# Patient Record
Sex: Male | Born: 1965
Health system: Southern US, Community
[De-identification: ages and names within clinical notes are randomized; demographics above are authoritative.]

## PROBLEM LIST (undated history)

## (undated) ENCOUNTER — Emergency Department (HOSPITAL_BASED_OUTPATIENT_CLINIC_OR_DEPARTMENT_OTHER): Admission: EM | Payer: Medicaid Other | Source: Home / Self Care

## (undated) DIAGNOSIS — R7303 Prediabetes: Secondary | ICD-10-CM

## (undated) DIAGNOSIS — F419 Anxiety disorder, unspecified: Secondary | ICD-10-CM

## (undated) DIAGNOSIS — E785 Hyperlipidemia, unspecified: Secondary | ICD-10-CM

## (undated) DIAGNOSIS — F329 Major depressive disorder, single episode, unspecified: Secondary | ICD-10-CM

## (undated) DIAGNOSIS — J45909 Unspecified asthma, uncomplicated: Secondary | ICD-10-CM

## (undated) DIAGNOSIS — I10 Essential (primary) hypertension: Secondary | ICD-10-CM

## (undated) DIAGNOSIS — R0600 Dyspnea, unspecified: Secondary | ICD-10-CM

## (undated) DIAGNOSIS — M109 Gout, unspecified: Secondary | ICD-10-CM

## (undated) DIAGNOSIS — Z8711 Personal history of peptic ulcer disease: Secondary | ICD-10-CM

## (undated) DIAGNOSIS — K219 Gastro-esophageal reflux disease without esophagitis: Secondary | ICD-10-CM

## (undated) DIAGNOSIS — G8929 Other chronic pain: Secondary | ICD-10-CM

## (undated) DIAGNOSIS — F32A Depression, unspecified: Secondary | ICD-10-CM

## (undated) DIAGNOSIS — Z8719 Personal history of other diseases of the digestive system: Secondary | ICD-10-CM

## (undated) DIAGNOSIS — E119 Type 2 diabetes mellitus without complications: Secondary | ICD-10-CM

## (undated) DIAGNOSIS — M1990 Unspecified osteoarthritis, unspecified site: Secondary | ICD-10-CM

## (undated) DIAGNOSIS — E7800 Pure hypercholesterolemia, unspecified: Secondary | ICD-10-CM

## (undated) HISTORY — PX: APPENDECTOMY: SHX54

## (undated) HISTORY — PX: BACK SURGERY: SHX140

## (undated) HISTORY — PX: ABDOMINAL SURGERY: SHX537

## (undated) HISTORY — PX: OTHER SURGICAL HISTORY: SHX169

## (undated) HISTORY — PX: EYE SURGERY: SHX253

## (undated) HISTORY — PX: UMBILICAL HERNIA REPAIR: SHX196

---

## 1989-07-09 HISTORY — PX: ABDOMINAL SURGERY: SHX537

## 1999-02-10 ENCOUNTER — Emergency Department (HOSPITAL_COMMUNITY): Admission: EM | Admit: 1999-02-10 | Discharge: 1999-02-11 | Payer: Self-pay

## 1999-04-22 ENCOUNTER — Encounter: Payer: Self-pay | Admitting: Emergency Medicine

## 1999-04-22 ENCOUNTER — Emergency Department (HOSPITAL_COMMUNITY): Admission: EM | Admit: 1999-04-22 | Discharge: 1999-04-22 | Payer: Self-pay

## 1999-09-02 ENCOUNTER — Emergency Department (HOSPITAL_COMMUNITY): Admission: EM | Admit: 1999-09-02 | Discharge: 1999-09-02 | Payer: Self-pay | Admitting: Emergency Medicine

## 2003-06-09 ENCOUNTER — Emergency Department (HOSPITAL_COMMUNITY): Admission: EM | Admit: 2003-06-09 | Discharge: 2003-06-09 | Payer: Self-pay | Admitting: Emergency Medicine

## 2004-10-15 ENCOUNTER — Emergency Department (HOSPITAL_COMMUNITY): Admission: EM | Admit: 2004-10-15 | Discharge: 2004-10-15 | Payer: Self-pay | Admitting: Emergency Medicine

## 2007-11-30 ENCOUNTER — Emergency Department (HOSPITAL_COMMUNITY): Admission: EM | Admit: 2007-11-30 | Discharge: 2007-11-30 | Payer: Self-pay | Admitting: Emergency Medicine

## 2008-03-03 ENCOUNTER — Emergency Department (HOSPITAL_COMMUNITY): Admission: EM | Admit: 2008-03-03 | Discharge: 2008-03-03 | Payer: Self-pay | Admitting: Emergency Medicine

## 2008-03-03 IMAGING — CR DG CHEST 2V
2 series · 2 of 2 positions shown · non-contrast
Comparison: None

CLINICAL DATA: Cough, shortness of breath

CHEST - 2 VIEW

[w chest pa]
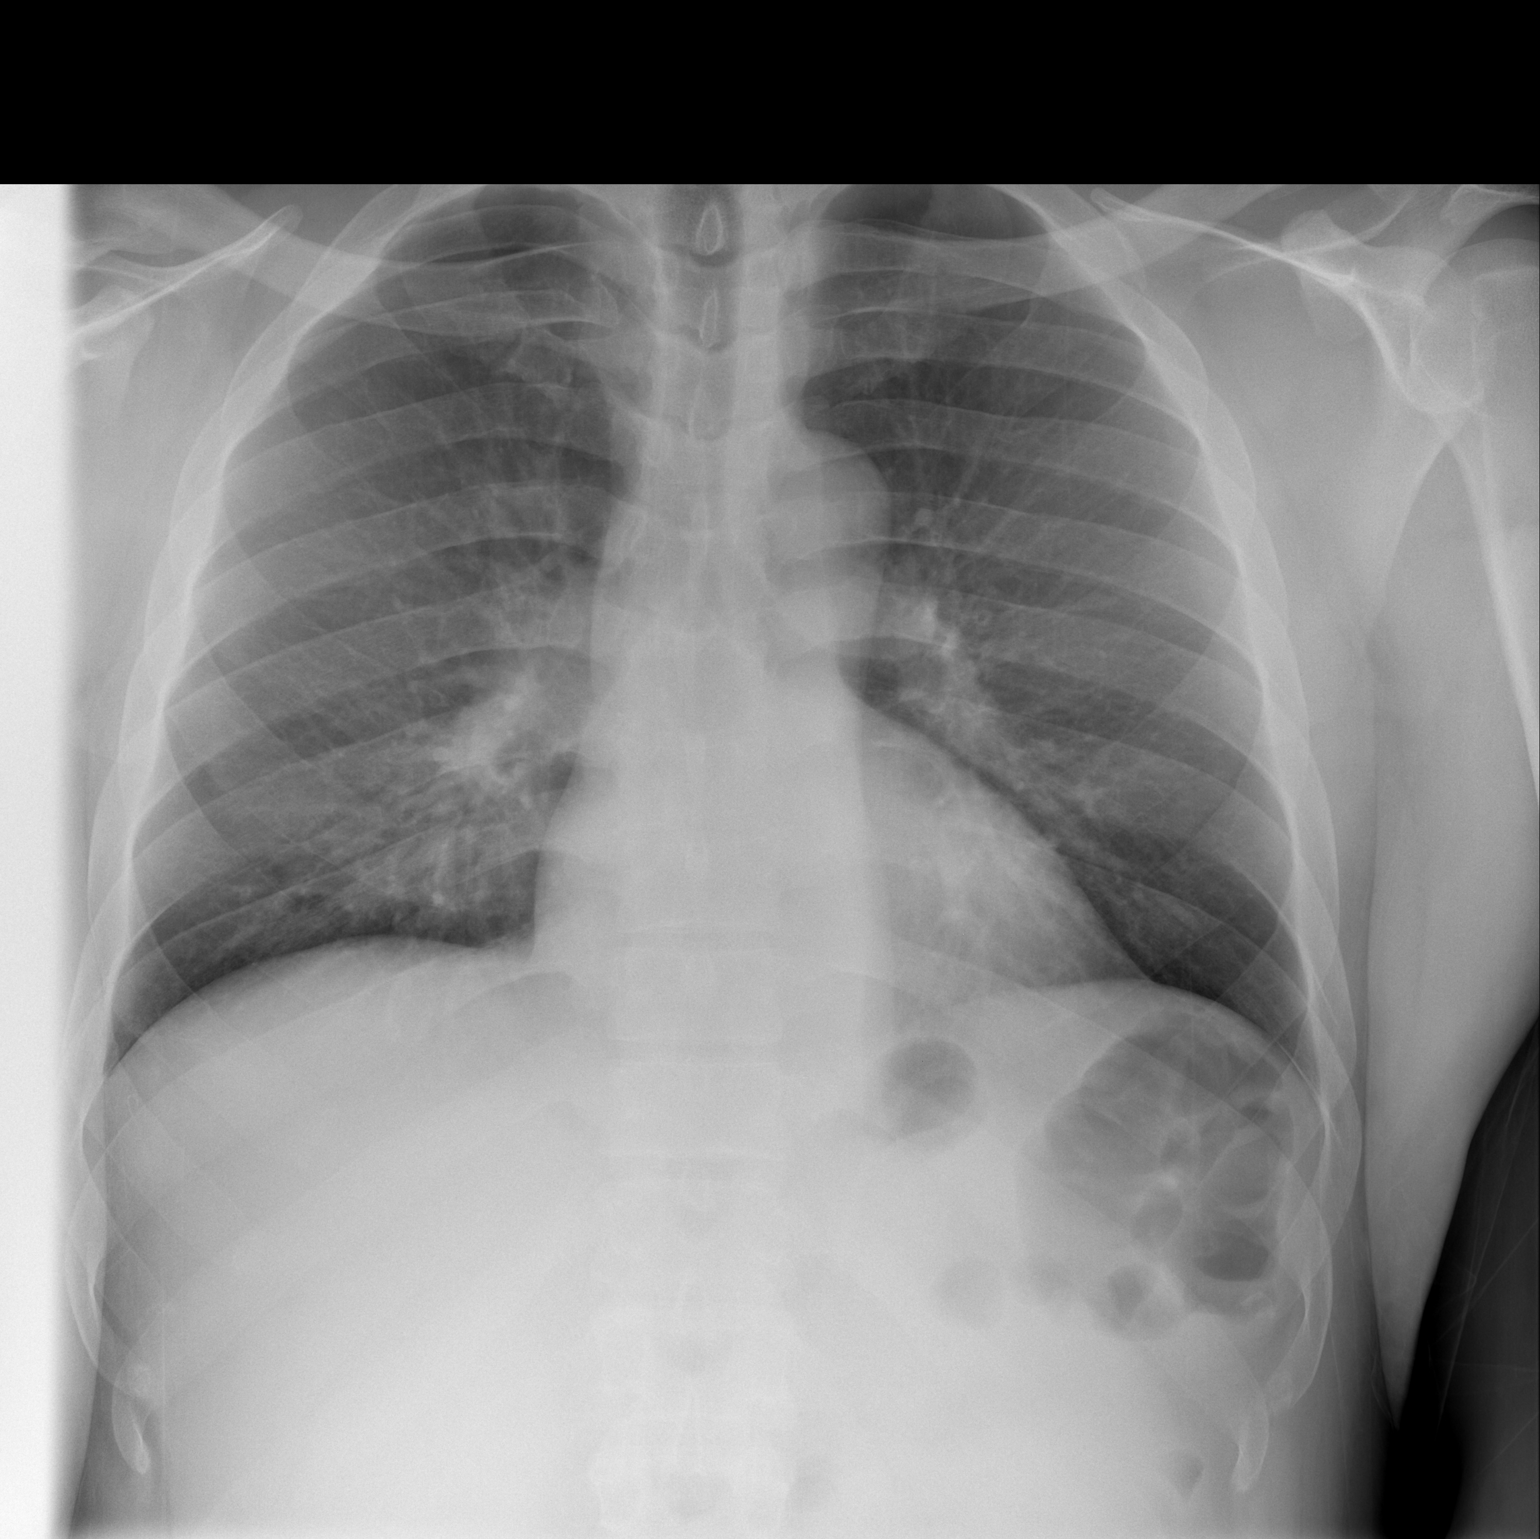

[w chest lat]
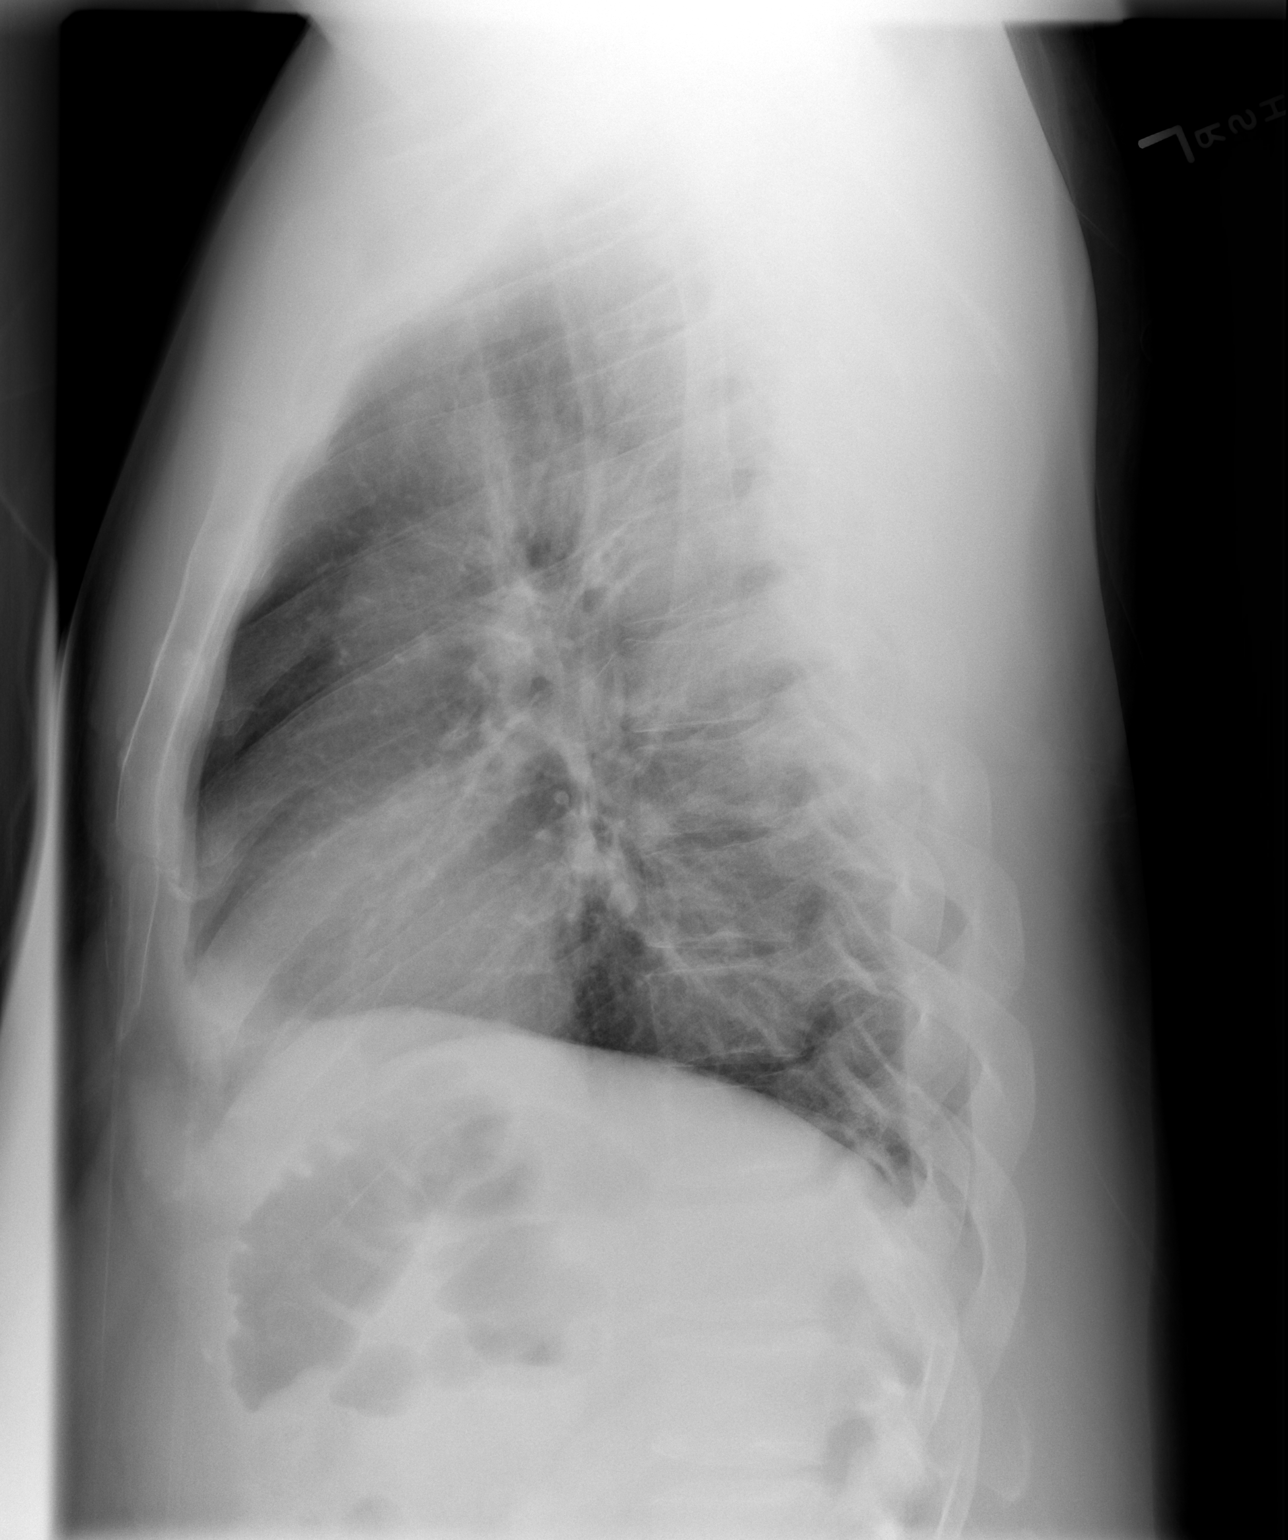

[2 of 2 positions shown; findings below may reference images not displayed]

FINDINGS: Heart and mediastinal contours are within normal limits.
No focal opacities or effusions.  No acute bony abnormality.
IMPRESSION: No active disease.

## 2008-03-28 ENCOUNTER — Emergency Department (HOSPITAL_COMMUNITY): Admission: EM | Admit: 2008-03-28 | Discharge: 2008-03-28 | Payer: Self-pay | Admitting: Emergency Medicine

## 2008-03-28 IMAGING — CR DG NECK SOFT TISSUE
2 series · 2 of 2 positions shown · non-contrast
Comparison: None

CLINICAL DATA: Foreign body stuck in throat

NECK SOFT TISSUES - 3+ VIEW

[w soft tissue neck]
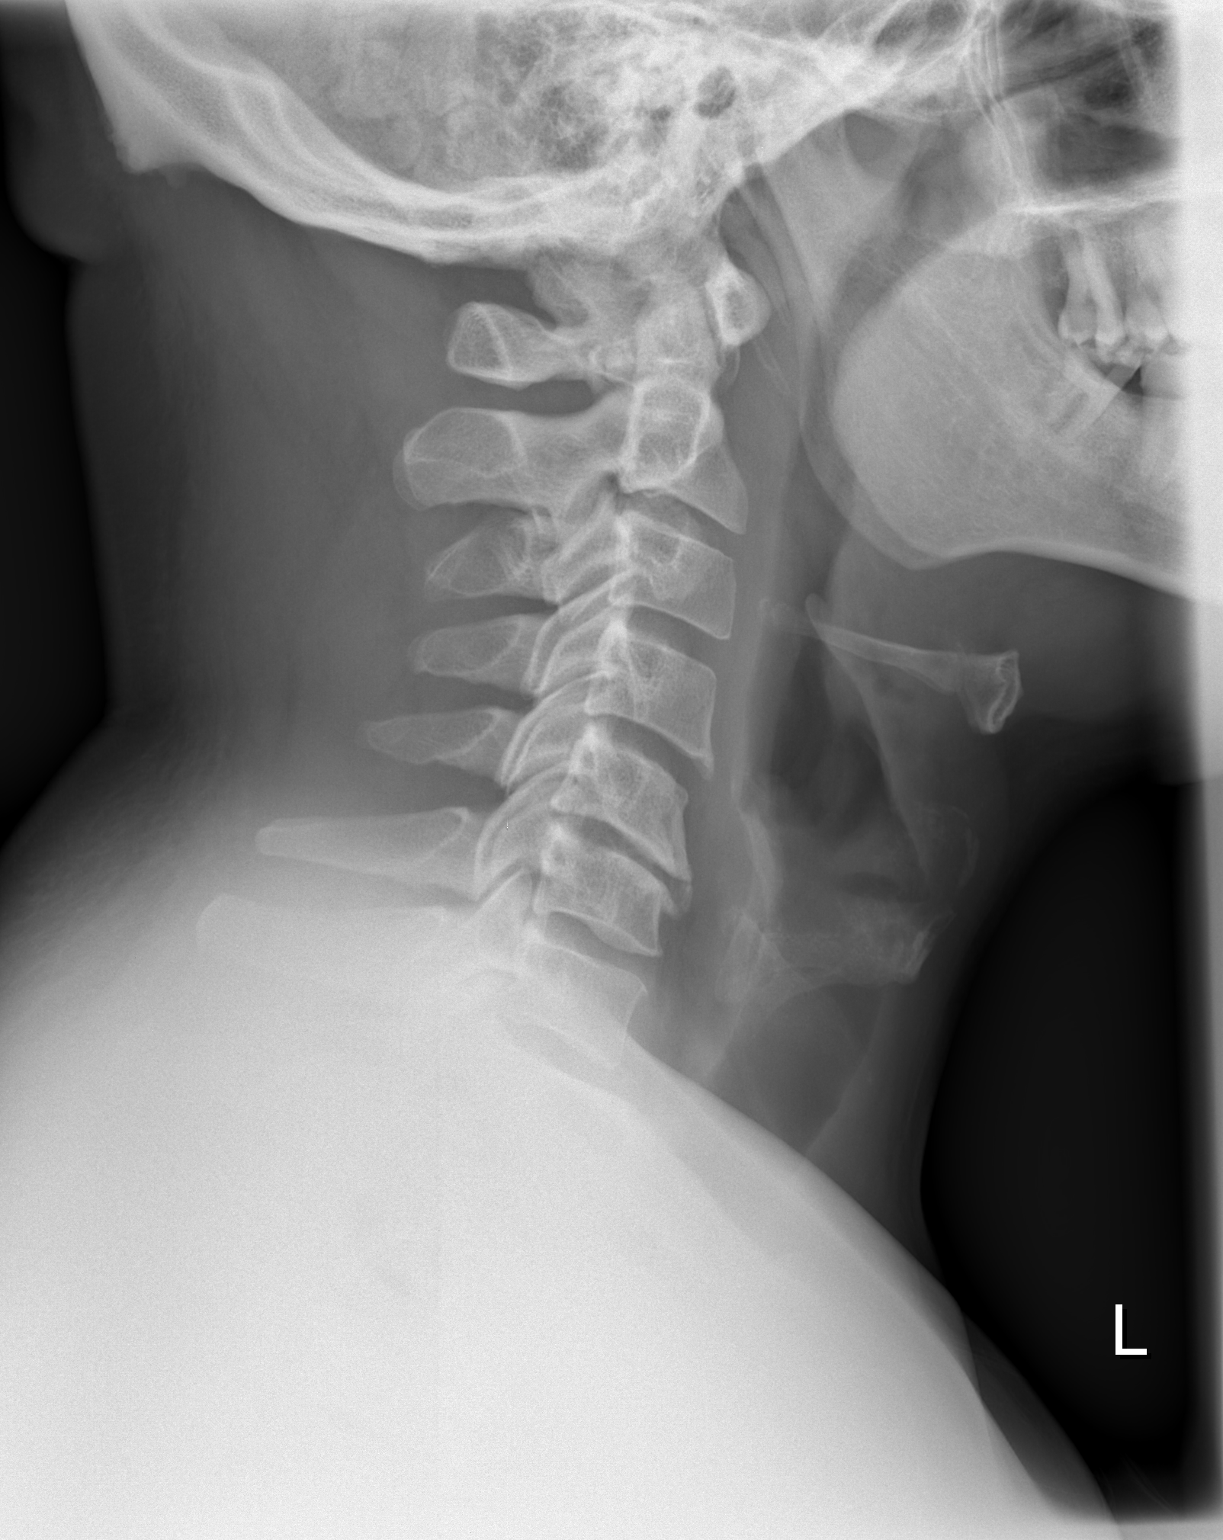

[w soft tissue neck ap]
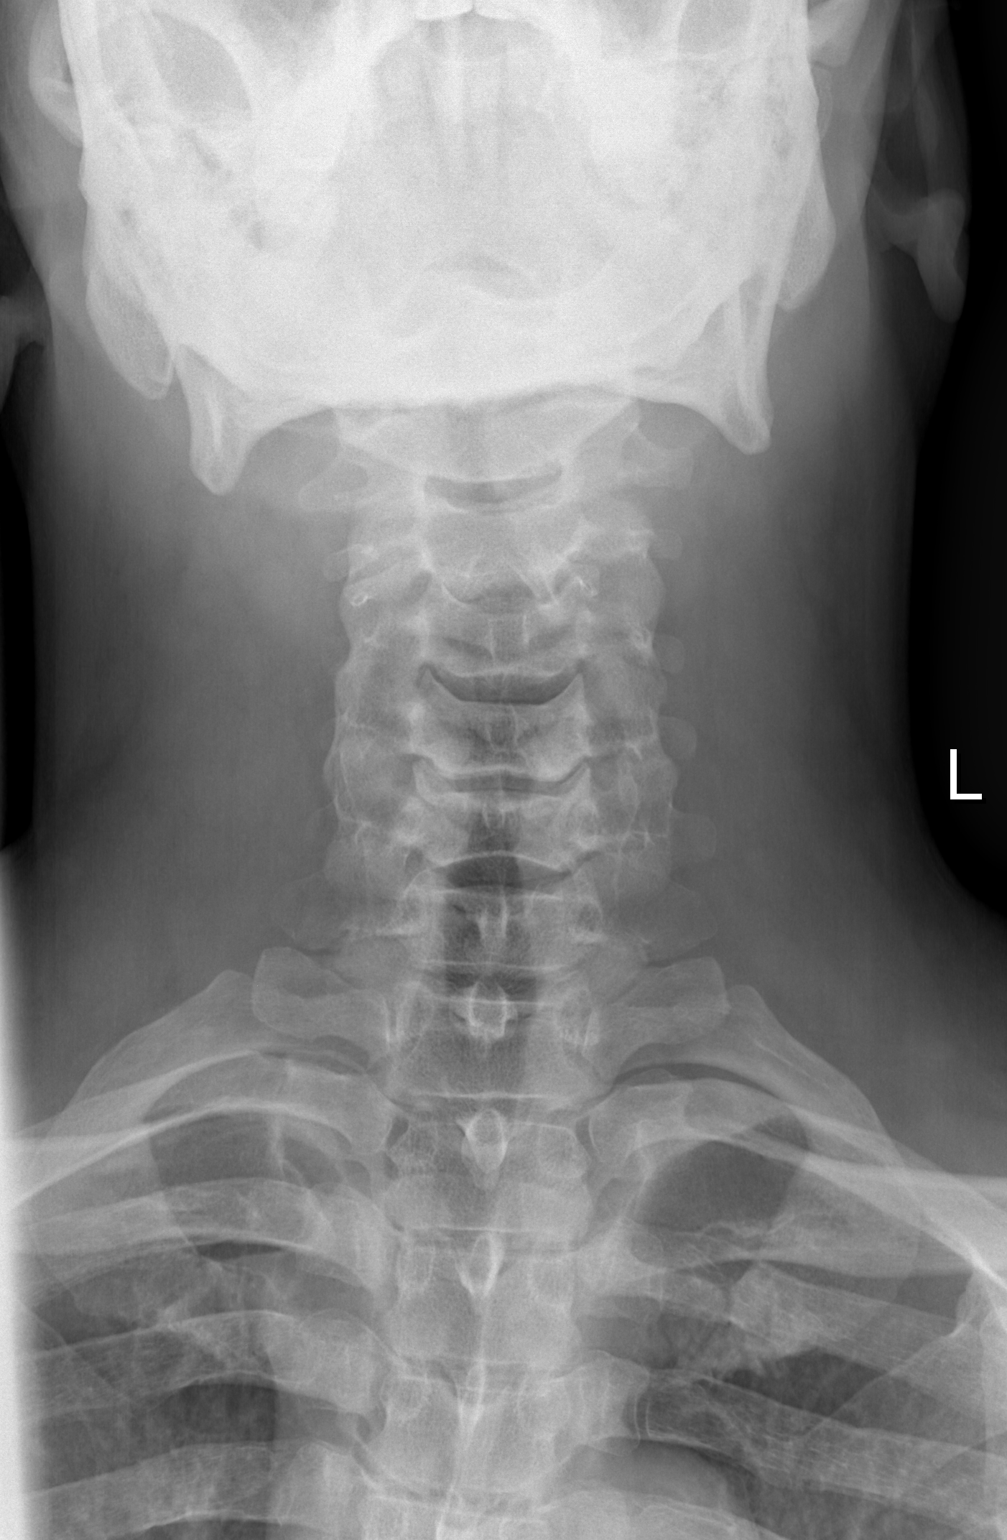

[2 of 2 positions shown; findings below may reference images not displayed]

FINDINGS: The airway appears unremarkable.  No radiopaque foreign
body.  Soft tissues unremarkable.  Degenerative disc disease
changes at C5-6.
IMPRESSION: Specifically, there is no radiopaque foreign body.

## 2008-11-10 ENCOUNTER — Emergency Department (HOSPITAL_COMMUNITY): Admission: EM | Admit: 2008-11-10 | Discharge: 2008-11-10 | Payer: Self-pay | Admitting: Emergency Medicine

## 2009-01-22 ENCOUNTER — Emergency Department (HOSPITAL_COMMUNITY): Admission: EM | Admit: 2009-01-22 | Discharge: 2009-01-22 | Payer: Self-pay | Admitting: Emergency Medicine

## 2009-01-22 IMAGING — CR DG CHEST 1V PORT
1 series · 1 of 1 positions shown · non-contrast
Comparison: [DATE]

CLINICAL DATA: Respiratory distress, chest pain

PORTABLE CHEST - 1 VIEW

[view not recorded]
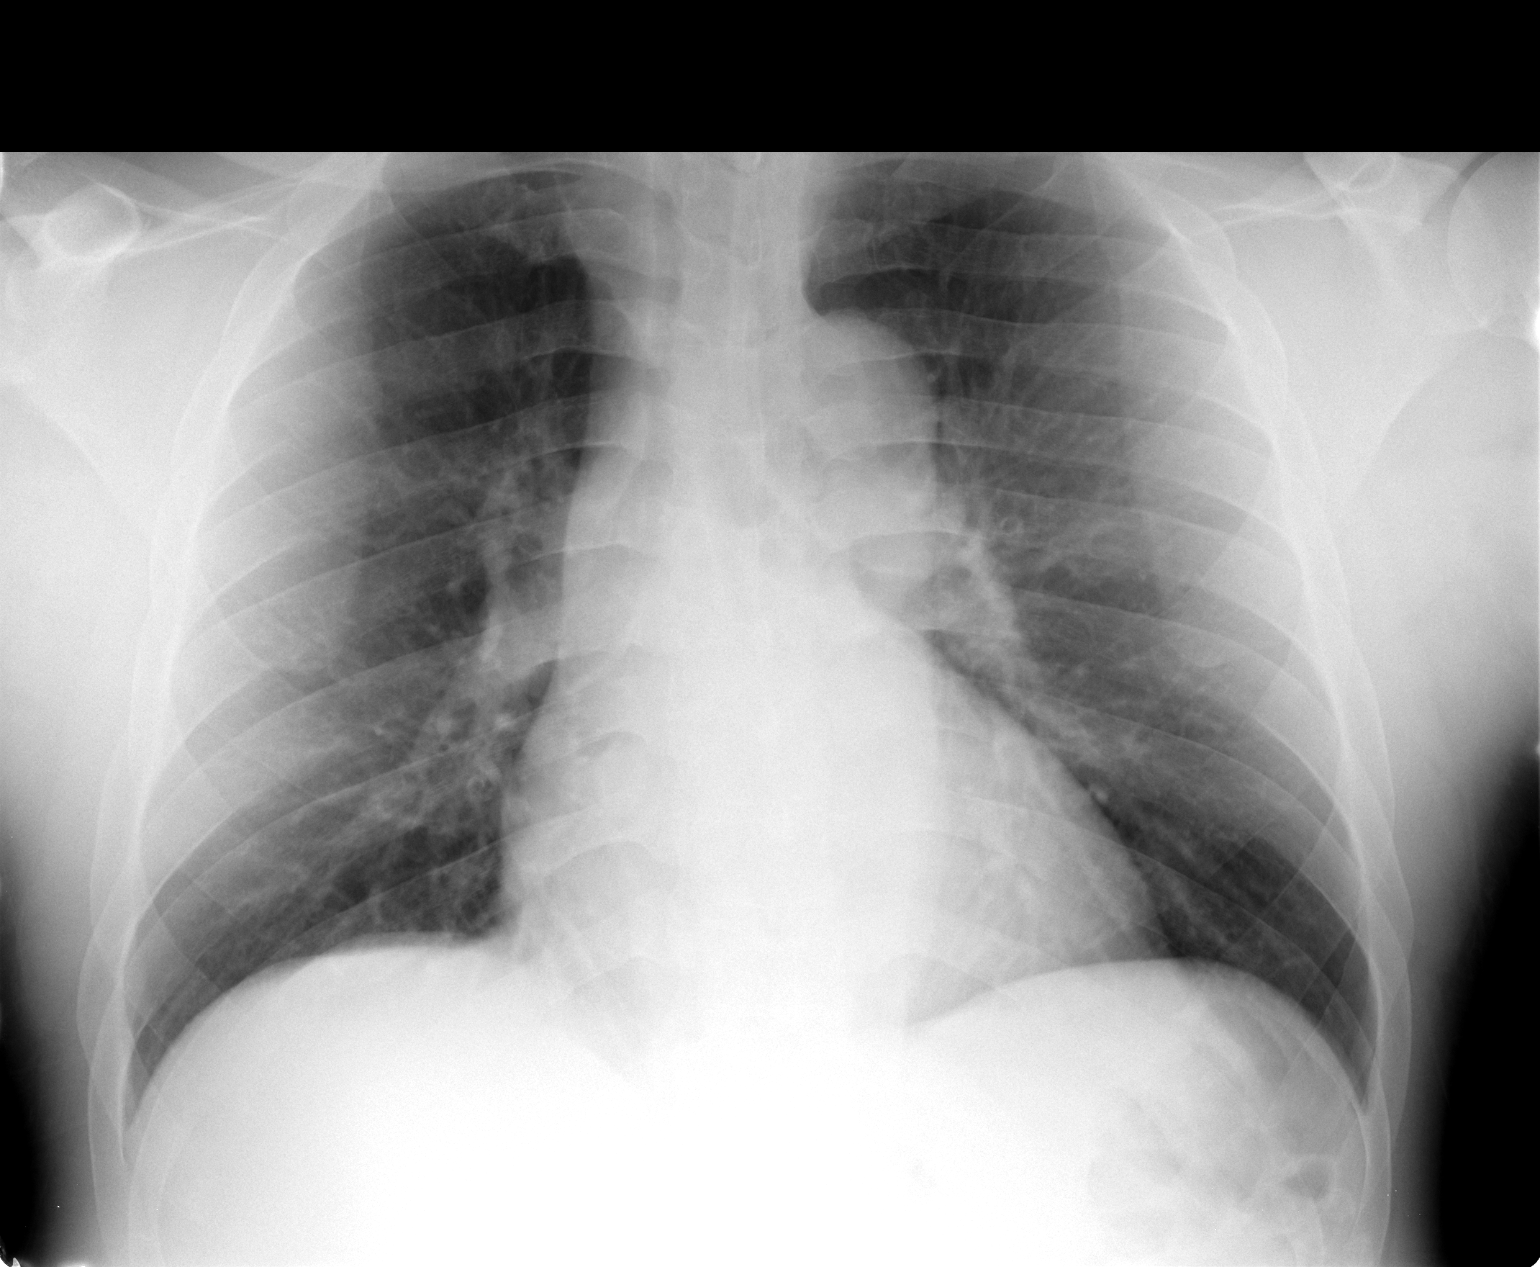

[1 of 1 positions shown; findings below may reference images not displayed]

FINDINGS: Heart size is at the upper limits of normal which may be
accentuated by AP portable technique.  Aorta is unfolded.  Lungs
are clear.  No pleural effusion.
IMPRESSION: No acute cardiopulmonary process.  Borderline cardiomegaly.  If the
patient's symptoms continue, consider PA and lateral chest
radiographs obtained at full inspiration when the patient is
clinically able.

## 2010-10-17 LAB — POCT I-STAT, CHEM 8
BUN: 14 mg/dL (ref 6–23)
Calcium, Ion: 1.1 mmol/L — ABNORMAL LOW (ref 1.12–1.32)
Chloride: 107 mEq/L (ref 96–112)
Creatinine, Ser: 1.1 mg/dL (ref 0.4–1.5)
Glucose, Bld: 104 mg/dL — ABNORMAL HIGH (ref 70–99)
HCT: 46 % (ref 39.0–52.0)
Hemoglobin: 15.6 g/dL (ref 13.0–17.0)
Potassium: 3.6 mEq/L (ref 3.5–5.1)
Sodium: 140 mEq/L (ref 135–145)
TCO2: 23 mmol/L (ref 0–100)

## 2010-10-17 LAB — CK: Total CK: 640 U/L — ABNORMAL HIGH (ref 7–232)

## 2011-02-24 ENCOUNTER — Encounter: Payer: Self-pay | Admitting: *Deleted

## 2011-02-24 ENCOUNTER — Emergency Department (HOSPITAL_BASED_OUTPATIENT_CLINIC_OR_DEPARTMENT_OTHER)
Admission: EM | Admit: 2011-02-24 | Discharge: 2011-02-24 | Disposition: A | Discharge status: Home / Self Care | Payer: Self-pay | Attending: Emergency Medicine | Admitting: Emergency Medicine

## 2011-02-24 ENCOUNTER — Emergency Department (INDEPENDENT_AMBULATORY_CARE_PROVIDER_SITE_OTHER): Payer: Self-pay

## 2011-02-24 DIAGNOSIS — R0602 Shortness of breath: Secondary | ICD-10-CM | POA: Insufficient documentation

## 2011-02-24 DIAGNOSIS — J45909 Unspecified asthma, uncomplicated: Secondary | ICD-10-CM | POA: Insufficient documentation

## 2011-02-24 DIAGNOSIS — R05 Cough: Secondary | ICD-10-CM

## 2011-02-24 DIAGNOSIS — R059 Cough, unspecified: Secondary | ICD-10-CM

## 2011-02-24 IMAGING — CR DG CHEST 2V
2 series · 2 of 2 positions shown · non-contrast
Comparison: [DATE]

CLINICAL DATA: Shortness of breath, cough

CHEST - 2 VIEW

[w chest pa]
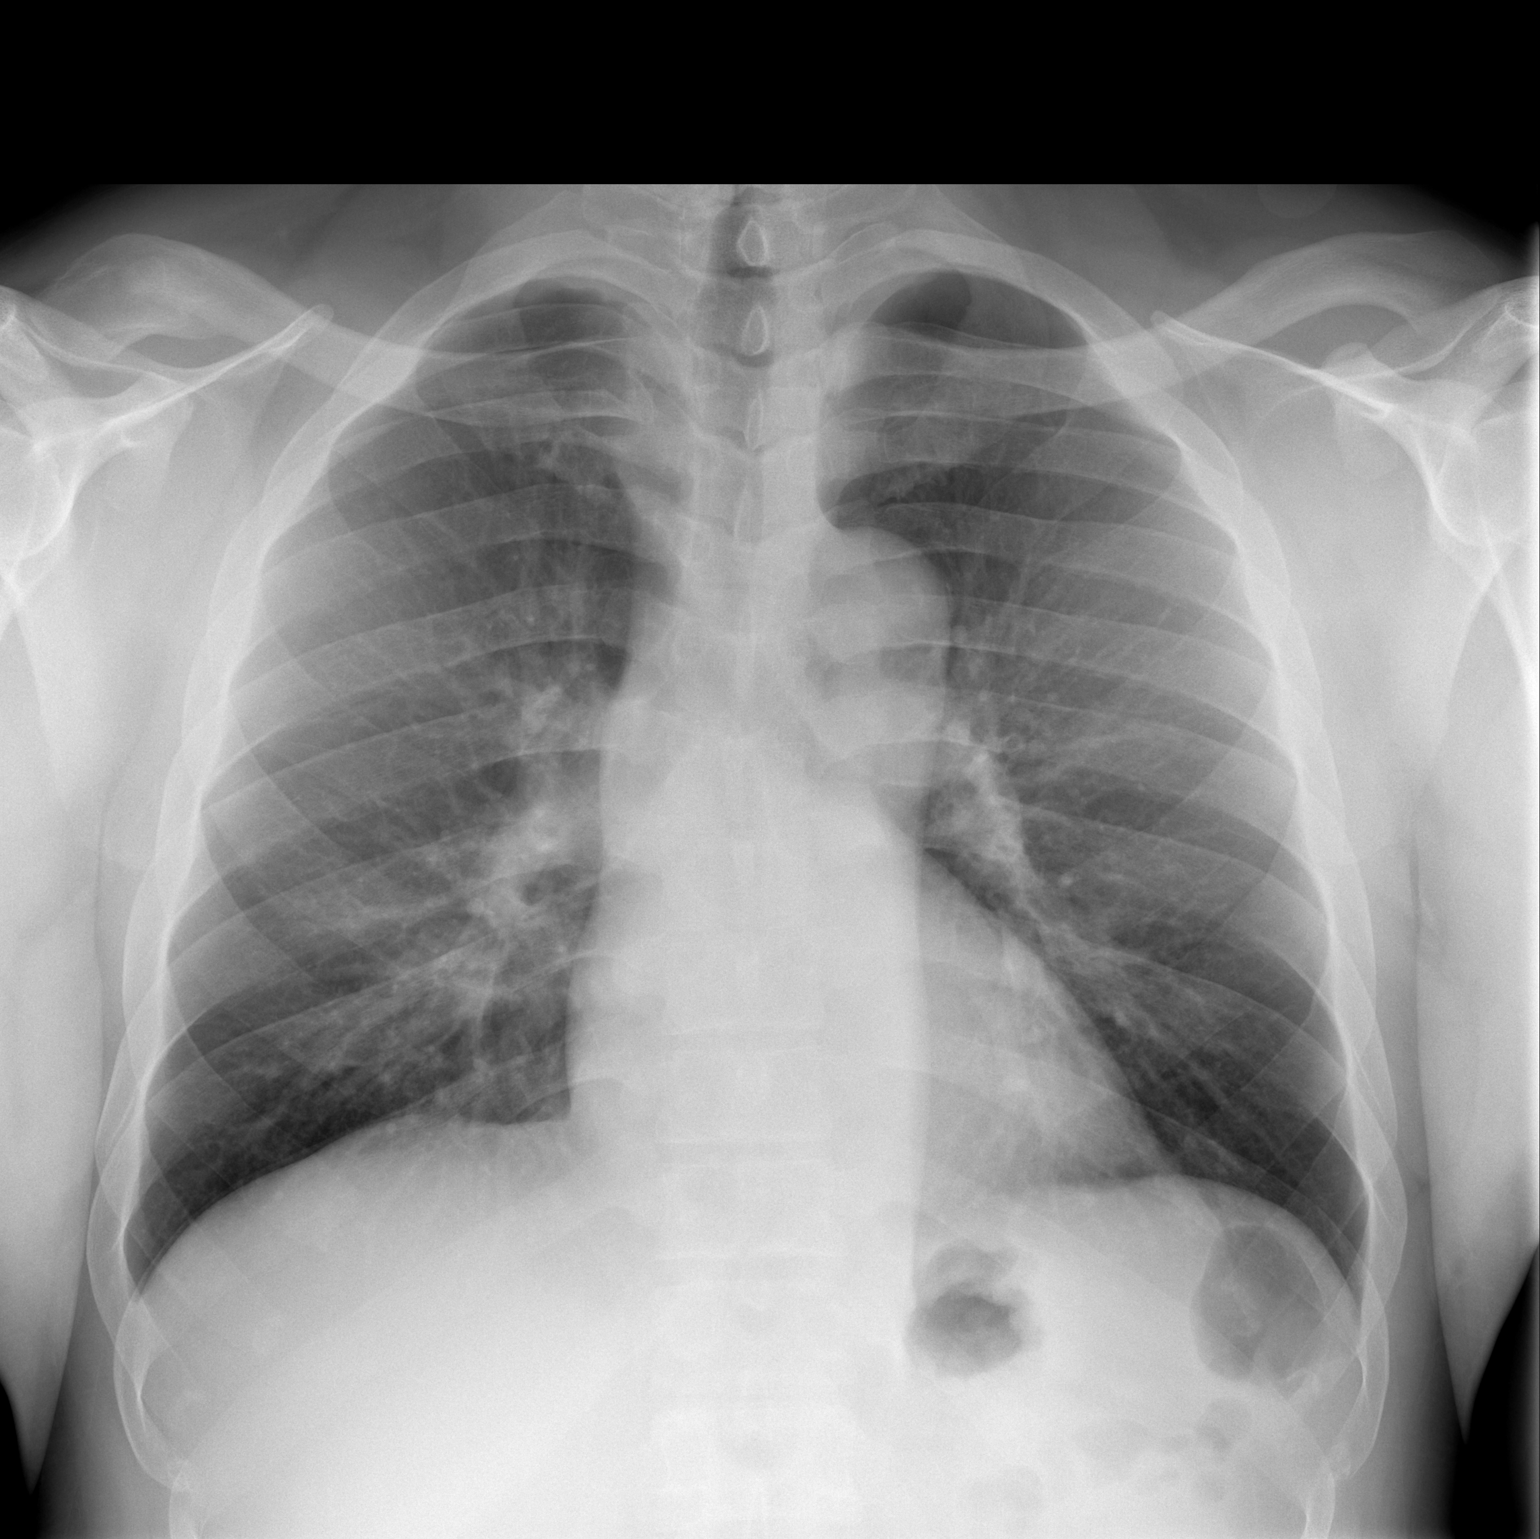

[w chest lat]
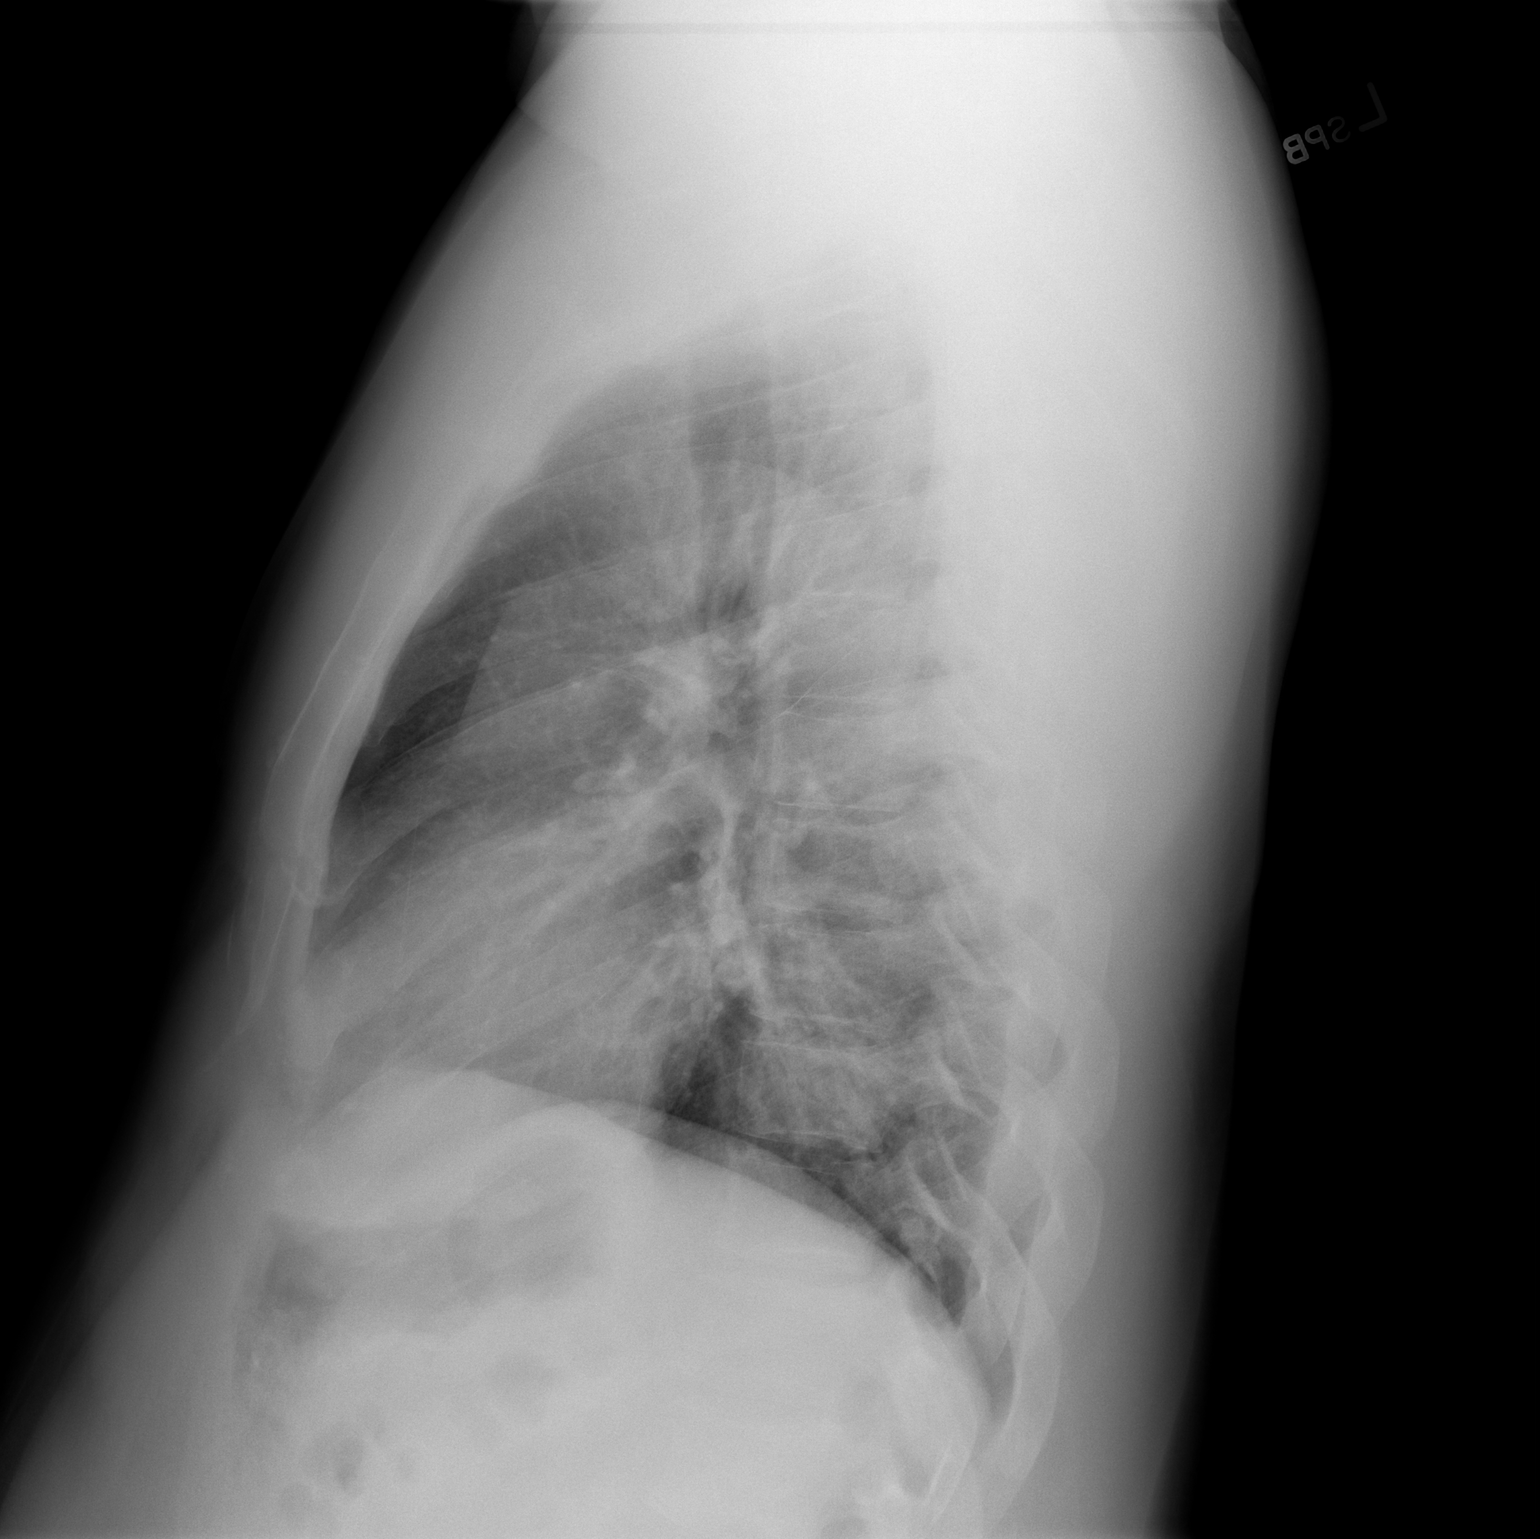

[2 of 2 positions shown; findings below may reference images not displayed]

FINDINGS: Aorta is ectatic and unfolded. The heart size is normal.
The lungs are clear.  No pleural effusion.  No acute osseous
finding.
IMPRESSION: No acute cardiopulmonary process.

## 2011-02-24 MED ORDER — ALBUTEROL SULFATE (5 MG/ML) 0.5% IN NEBU: 5.0000 mg | INHALATION_SOLUTION | RESPIRATORY_TRACT | Status: DC

## 2011-02-24 MED ORDER — PREDNISONE 20 MG PO TABS: 60.0000 mg | ORAL_TABLET | Freq: Every day | ORAL | Status: DC

## 2011-02-24 MED ORDER — ALBUTEROL SULFATE (5 MG/ML) 0.5% IN NEBU
2.5000 mg | INHALATION_SOLUTION | Freq: Once | RESPIRATORY_TRACT | Status: DC
  Administered 2011-02-24: 5 mg via RESPIRATORY_TRACT

## 2011-02-24 MED ORDER — IPRATROPIUM BROMIDE 0.02 % IN SOLN
RESPIRATORY_TRACT | Status: AC
  Administered 2011-02-24: 0.5 mg via RESPIRATORY_TRACT
  Filled 2011-02-24: qty 2.5

## 2011-02-24 MED ORDER — IPRATROPIUM BROMIDE 0.02 % IN SOLN
0.5000 mg | Freq: Once | RESPIRATORY_TRACT | Status: DC
  Administered 2011-02-24: 0.5 mg via RESPIRATORY_TRACT

## 2011-02-24 MED ORDER — IPRATROPIUM BROMIDE 0.02 % IN SOLN: 0.5000 mg | Freq: Once | RESPIRATORY_TRACT | Status: DC

## 2011-02-24 MED ORDER — PREDNISONE 20 MG PO TABS
60.0000 mg | ORAL_TABLET | Freq: Once | ORAL | Status: AC
  Administered 2011-02-24: 60 mg via ORAL

## 2011-02-24 MED ORDER — ALBUTEROL SULFATE (5 MG/ML) 0.5% IN NEBU
INHALATION_SOLUTION | RESPIRATORY_TRACT | Status: AC
  Administered 2011-02-24: 5 mg via RESPIRATORY_TRACT
  Filled 2011-02-24: qty 1

## 2011-02-24 MED ORDER — ALBUTEROL SULFATE HFA 108 (90 BASE) MCG/ACT IN AERS
2.0000 | INHALATION_SPRAY | Freq: Once | RESPIRATORY_TRACT | Status: AC
  Administered 2011-02-24: 2 via RESPIRATORY_TRACT
  Filled 2011-02-24: qty 6.7

## 2011-02-24 MED ORDER — PREDNISONE 10 MG PO TABS: 20.0000 mg | ORAL_TABLET | Freq: Every day | ORAL | Status: DC

## 2011-02-24 MED ORDER — PREDNISONE 20 MG PO TABS
ORAL_TABLET | ORAL | Status: AC
  Filled 2011-02-24: qty 3

## 2011-02-24 NOTE — ED Provider Notes (Addendum)
History     CSN: [id number] Arrival date & time: 02/24/2011  8:44 AM  Chief Complaint  Patient presents with  . Asthma  . Shortness of Breath  . Wheezing   Patient is a 45 y.o. male presenting with asthma, shortness of breath, and wheezing. The history is provided by the patient.  Asthma This is a recurrent problem. The current episode started more than 2 days ago. The problem occurs constantly. The problem has been gradually worsening. Associated symptoms include shortness of breath. The symptoms are aggravated by nothing. The symptoms are relieved by nothing. The treatment provided no relief.  Shortness of Breath  Associated symptoms include shortness of breath and wheezing. His past medical history is significant for asthma.  Wheezing  Associated symptoms include shortness of breath and wheezing. His past medical history is significant for asthma.    Past Medical History  Diagnosis Date  . Asthma     History reviewed. No pertinent past surgical history.  History reviewed. No pertinent family history.  History  Substance Use Topics  . Smoking status: Never Smoker   . Smokeless tobacco: Not on file  . Alcohol Use: 1.2 oz/week    2 Cans of beer per week      Review of Systems  Respiratory: Positive for shortness of breath and wheezing.   All other systems reviewed and are negative.    Physical Exam  BP 149/99  Pulse 90  Temp(Src) 98.5 F (36.9 C) (Oral)  Resp 24  SpO2 94%  Physical Exam  Constitutional: He is oriented to person, place, and time. He appears well-developed and well-nourished.  HENT:  Head: Normocephalic and atraumatic.  Eyes: Pupils are equal, round, and reactive to light.  Cardiovascular: Normal rate.   Pulmonary/Chest: He has wheezes.       stridor  Abdominal: Soft.  Musculoskeletal: Normal range of motion. He exhibits no edema and no tenderness.  Neurological: He is alert and oriented to person, place, and time.  Skin: Skin is warm and  dry.  Psychiatric: He has a normal mood and affect.    ED Course  Procedures  MDM Wheezing and stridor resolved with first neb.  Patient given and instructed on albuterol mdi.  Prednisone given here.      Hilario Quarry, MD 02/24/11 1006  Hilario Quarry, MD 02/25/11 [phone]

## 2011-02-24 NOTE — ED Notes (Signed)
After tx bbs exp wheezing however not audible.  Spo2 94% resting.  Pt states "I feels better."

## 2011-02-24 NOTE — ED Notes (Signed)
Pt declines w/c, amb from registration to room 1 with quick steady gait, audible wheezes noted.  Pt is able to speak short phrases, rt at bedside with neb treatment, dr. Rosalia Hammers at bedside for eval.  Rpp, air sats initially 91-92%.

## 2011-03-01 ENCOUNTER — Emergency Department (HOSPITAL_BASED_OUTPATIENT_CLINIC_OR_DEPARTMENT_OTHER)
Admission: EM | Admit: 2011-03-01 | Discharge: 2011-03-01 | Disposition: A | Discharge status: Home / Self Care | Payer: Self-pay | Attending: Emergency Medicine | Admitting: Emergency Medicine

## 2011-03-01 ENCOUNTER — Emergency Department (INDEPENDENT_AMBULATORY_CARE_PROVIDER_SITE_OTHER): Payer: Self-pay

## 2011-03-01 ENCOUNTER — Encounter (HOSPITAL_BASED_OUTPATIENT_CLINIC_OR_DEPARTMENT_OTHER): Payer: Self-pay | Admitting: *Deleted

## 2011-03-01 DIAGNOSIS — R0989 Other specified symptoms and signs involving the circulatory and respiratory systems: Secondary | ICD-10-CM

## 2011-03-01 DIAGNOSIS — J441 Chronic obstructive pulmonary disease with (acute) exacerbation: Secondary | ICD-10-CM | POA: Insufficient documentation

## 2011-03-01 DIAGNOSIS — R509 Fever, unspecified: Secondary | ICD-10-CM

## 2011-03-01 DIAGNOSIS — R0602 Shortness of breath: Secondary | ICD-10-CM | POA: Insufficient documentation

## 2011-03-01 DIAGNOSIS — I10 Essential (primary) hypertension: Secondary | ICD-10-CM | POA: Insufficient documentation

## 2011-03-01 DIAGNOSIS — R059 Cough, unspecified: Secondary | ICD-10-CM

## 2011-03-01 DIAGNOSIS — R05 Cough: Secondary | ICD-10-CM

## 2011-03-01 DIAGNOSIS — R062 Wheezing: Secondary | ICD-10-CM

## 2011-03-01 DIAGNOSIS — E7800 Pure hypercholesterolemia, unspecified: Secondary | ICD-10-CM | POA: Insufficient documentation

## 2011-03-01 HISTORY — DX: Pure hypercholesterolemia, unspecified: E78.00

## 2011-03-01 HISTORY — DX: Essential (primary) hypertension: I10

## 2011-03-01 LAB — DIFFERENTIAL
Basophils Absolute: 0 10*3/uL (ref 0.0–0.1)
Basophils Relative: 0 % (ref 0–1)
Eosinophils Absolute: 0.6 10*3/uL (ref 0.0–0.7)
Eosinophils Relative: 8 % — ABNORMAL HIGH (ref 0–5)
Lymphocytes Relative: 30 % (ref 12–46)
Lymphs Abs: 2.2 10*3/uL (ref 0.7–4.0)
Monocytes Absolute: 0.8 10*3/uL (ref 0.1–1.0)
Monocytes Relative: 11 % (ref 3–12)
Neutro Abs: 3.8 10*3/uL (ref 1.7–7.7)
Neutrophils Relative %: 51 % (ref 43–77)

## 2011-03-01 LAB — CBC
HCT: 46.1 % (ref 39.0–52.0)
Hemoglobin: 15.7 g/dL (ref 13.0–17.0)
MCH: 26.4 pg (ref 26.0–34.0)
MCHC: 34.1 g/dL (ref 30.0–36.0)
MCV: 77.6 fL — ABNORMAL LOW (ref 78.0–100.0)
Platelets: 168 10*3/uL (ref 150–400)
RBC: 5.94 MIL/uL — ABNORMAL HIGH (ref 4.22–5.81)
RDW: 15.9 % — ABNORMAL HIGH (ref 11.5–15.5)
WBC: 7.4 10*3/uL (ref 4.0–10.5)

## 2011-03-01 LAB — BASIC METABOLIC PANEL
BUN: 12 mg/dL (ref 6–23)
CO2: 23 mEq/L (ref 19–32)
Calcium: 9.4 mg/dL (ref 8.4–10.5)
Chloride: 99 mEq/L (ref 96–112)
Creatinine, Ser: 0.9 mg/dL (ref 0.50–1.35)
GFR calc Af Amer: >60 mL/min (ref >60)
GFR calc non Af Amer: >60 mL/min (ref >60)
Glucose, Bld: 102 mg/dL — ABNORMAL HIGH (ref 70–99)
Potassium: 3.9 mEq/L (ref 3.5–5.1)
Sodium: 136 mEq/L (ref 135–145)

## 2011-03-01 IMAGING — CR DG CHEST 1V PORT
1 series · 1 of 1 positions shown · non-contrast
Comparison: Chest radiograph performed [DATE]

CLINICAL DATA: Wheezing, cough and congestion; fever.  History of
asthma.

PORTABLE CHEST - 1 VIEW

[view not recorded]
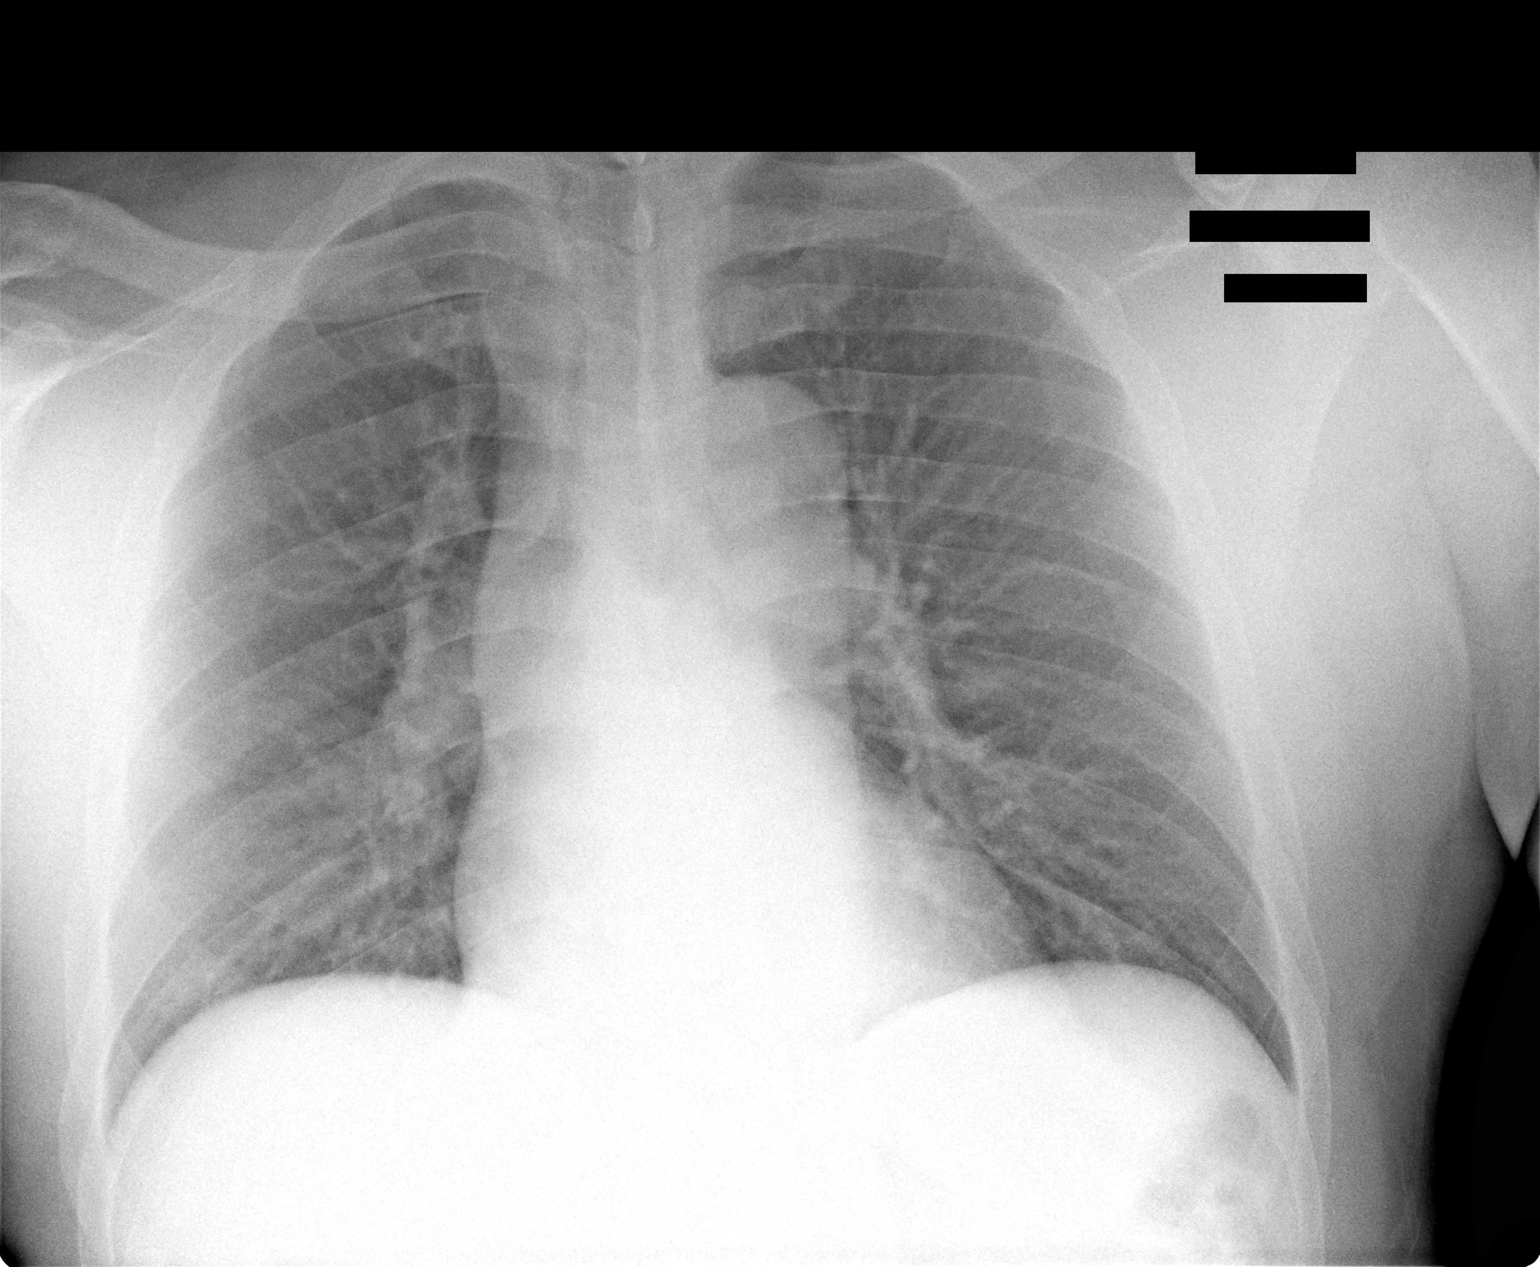

[1 of 1 positions shown; findings below may reference images not displayed]

FINDINGS: The lungs are well-aerated.  Mild vascular congestion is
noted.  There is no evidence of focal opacification, pleural
effusion or pneumothorax.

The cardiomediastinal silhouette is within normal limits.  No acute
osseous abnormalities are seen.
IMPRESSION: Mild vascular congestion noted; lungs remain clear.

## 2011-03-01 MED ORDER — IPRATROPIUM BROMIDE 0.02 % IN SOLN
0.5000 mg | Freq: Once | RESPIRATORY_TRACT | Status: AC
  Administered 2011-03-01: 0.5 mg via RESPIRATORY_TRACT

## 2011-03-01 MED ORDER — ALBUTEROL SULFATE (5 MG/ML) 0.5% IN NEBU: 2.5000 mg | INHALATION_SOLUTION | Freq: Once | RESPIRATORY_TRACT | Status: DC

## 2011-03-01 MED ORDER — PREDNISONE 10 MG PO TABS: 20.0000 mg | ORAL_TABLET | Freq: Every day | ORAL | Status: DC

## 2011-03-01 MED ORDER — MAGNESIUM SULFATE 50 % IJ SOLN
1.0000 g | Freq: Once | INTRAMUSCULAR | Status: AC
  Administered 2011-03-01: 1 g via INTRAVENOUS

## 2011-03-01 MED ORDER — IPRATROPIUM BROMIDE 0.02 % IN SOLN: 0.5000 mg | Freq: Once | RESPIRATORY_TRACT | Status: DC

## 2011-03-01 MED ORDER — PREDNISONE 10 MG PO TABS: 20.0000 mg | ORAL_TABLET | Freq: Two times a day (BID) | ORAL | Status: AC

## 2011-03-01 MED ORDER — ALBUTEROL SULFATE (5 MG/ML) 0.5% IN NEBU
40.0000 mg | INHALATION_SOLUTION | Freq: Once | RESPIRATORY_TRACT | Status: AC
  Administered 2011-03-01: 40 mg via RESPIRATORY_TRACT

## 2011-03-01 MED ORDER — FLUTICASONE PROPIONATE HFA 44 MCG/ACT IN AERO
2.0000 | INHALATION_SPRAY | Freq: Once | RESPIRATORY_TRACT | Status: AC
  Administered 2011-03-01: 2 via RESPIRATORY_TRACT

## 2011-03-01 MED ORDER — ACETAMINOPHEN 325 MG PO TABS
650.0000 mg | ORAL_TABLET | Freq: Once | ORAL | Status: AC
  Administered 2011-03-01: 650 mg via ORAL

## 2011-03-01 MED ORDER — IPRATROPIUM BROMIDE HFA 17 MCG/ACT IN AERS: 2.0000 | INHALATION_SPRAY | Freq: Once | RESPIRATORY_TRACT | Status: DC

## 2011-03-01 MED ORDER — ALBUTEROL SULFATE (5 MG/ML) 0.5% IN NEBU
2.5000 mg | INHALATION_SOLUTION | RESPIRATORY_TRACT | Status: DC
Start: 2011-03-01 — End: 2011-03-01

## 2011-03-01 MED ORDER — ALBUTEROL SULFATE (5 MG/ML) 0.5% IN NEBU
5.0000 mg | INHALATION_SOLUTION | Freq: Once | RESPIRATORY_TRACT | Status: AC
  Administered 2011-03-01: 5 mg via RESPIRATORY_TRACT

## 2011-03-01 MED ORDER — METHYLPREDNISOLONE SODIUM SUCC 125 MG IJ SOLR
125.0000 mg | Freq: Once | INTRAMUSCULAR | Status: AC
  Administered 2011-03-01: 125 mg via INTRAVENOUS

## 2011-03-01 NOTE — ED Provider Notes (Signed)
History     CSN: [id number] Arrival date & time: 03/01/2011  5:32 AM  Chief Complaint  Patient presents with  . Asthma   Patient is a 45 y.o. male presenting with asthma. The history is provided by the patient.  Asthma This is a new problem. The current episode started yesterday. The problem occurs constantly. The problem has been gradually worsening. Associated symptoms include shortness of breath. Pertinent negatives include no abdominal pain and no headaches. The symptoms are aggravated by exertion. The symptoms are relieved by nothing. Treatments tried: inhalers at home.   Has lonstanding h/o asthm a and multiple admits in the past, has never required intubation.  He was seen here for the same about 5 days ago and was unable to fill RX for steroids. He is uncertain what his triggers are, he denies any tob use or exposure.  No sick contacts, no measured fevers, no rash or illness otherwsie.  Past Medical History  Diagnosis Date  . Asthma   . Hypertension   . High cholesterol     History reviewed. No pertinent past surgical history.  No family history on file.  History  Substance Use Topics  . Smoking status: Never Smoker   . Smokeless tobacco: Not on file  . Alcohol Use: 1.2 oz/week    2 Cans of beer per week      Review of Systems  Constitutional: Negative for fever and chills.  HENT: Negative for neck pain and neck stiffness.   Eyes: Negative for pain.  Respiratory: Positive for shortness of breath and wheezing.   Cardiovascular: Negative for leg swelling.  Gastrointestinal: Negative for abdominal pain.  Genitourinary: Negative for dysuria.  Musculoskeletal: Negative for back pain.  Skin: Negative for rash.  Neurological: Negative for headaches.  All other systems reviewed and are negative.    Physical Exam  BP 146/89  Pulse 84  Temp(Src) 98.1 F (36.7 C) (Oral)  Resp 22  Ht 6\' 3"  (1.905 m)  Wt 252 lb (114.306 kg)  BMI 31.50 kg/m2  SpO2 93%  Physical  Exam  Constitutional: He is oriented to person, place, and time. He appears well-developed and well-nourished.  HENT:  Head: Normocephalic and atraumatic.  Eyes: Conjunctivae and EOM are normal. Pupils are equal, round, and reactive to light.  Neck: Trachea normal. Neck supple. No thyromegaly present.  Cardiovascular: Normal rate, regular rhythm, S1 normal, S2 normal and normal pulses.     No systolic murmur is present   No diastolic murmur is present  Pulses:      Radial pulses are 2+ on the right side, and 2+ on the left side.  Pulmonary/Chest: He has wheezes. He exhibits no tenderness.       Dec breath sounds bilateral with insp/ exp wheezes and borderline hypoxia room air. Minimal resp distress with tachypnea, no retractions or AMU.   Abdominal: Soft. Normal appearance and bowel sounds are normal. There is no tenderness. There is no CVA tenderness and negative Murphy's sign.  Musculoskeletal:       BLE:s Calves nontender, no cords or erythema, negative Homans sign  Neurological: He is alert and oriented to person, place, and time. He has normal strength. No cranial nerve deficit or sensory deficit. GCS eye subscore is 4. GCS verbal subscore is 5. GCS motor subscore is 6.  Skin: Skin is warm and dry. No rash noted. He is not diaphoretic.  Psychiatric: His speech is normal.       Cooperative and appropriate  ED Course  Procedures  MDM Asthma exacerbation - room air pulse ox 90-91%, given oxygen, steroids, aggressive breathing treatments and IV magnesium.  CXR and labs obtained/ reviewed. Serial evaluations and continuous nebs given. At [phone] is resting with pulse ox 92%, no resp distress but still has insp/ exp wheezes. PT declines admit. Plan continued ED treatment and observation. On further history, he did not fill RX prednisone when seen 5 days ago because he thought this was going to be 50 dollar prescription. PT educated on 4 dollar [facility] list of medications. At 0722am PT care  transferred to Dr Judd Lien for continued treatments and observation.   Results for orders placed during the hospital encounter of 03/01/11 (from the past 24 hour(s))  CBC     Status: Abnormal   Collection Time   03/01/11  5:46 AM      Component Value Range   WBC 7.4  4.0 - 10.5 (K/uL)   RBC 5.94 (*) 4.22 - 5.81 (MIL/uL)   Hemoglobin 15.7  13.0 - 17.0 (g/dL)   HCT 96.0  45.4 - 09.8 (%)   MCV 77.6 (*) 78.0 - 100.0 (fL)   MCH 26.4  26.0 - 34.0 (pg)   MCHC 34.1  30.0 - 36.0 (g/dL)   RDW 11.9 (*) 14.7 - 15.5 (%)   Platelets 168  150 - 400 (K/uL)  DIFFERENTIAL     Status: Abnormal   Collection Time   03/01/11  5:46 AM      Component Value Range   Neutrophils Relative 51  43 - 77 (%)   Neutro Abs 3.8  1.7 - 7.7 (K/uL)   Lymphocytes Relative 30  12 - 46 (%)   Lymphs Abs 2.2  0.7 - 4.0 (K/uL)   Monocytes Relative 11  3 - 12 (%)   Monocytes Absolute 0.8  0.1 - 1.0 (K/uL)   Eosinophils Relative 8 (*) 0 - 5 (%)   Eosinophils Absolute 0.6  0.0 - 0.7 (K/uL)   Basophils Relative 0  0 - 1 (%)   Basophils Absolute 0.0  0.0 - 0.1 (K/uL)  BASIC METABOLIC PANEL     Status: Abnormal   Collection Time   03/01/11  5:46 AM      Component Value Range   Sodium 136  135 - 145 (mEq/L)   Potassium 3.9  3.5 - 5.1 (mEq/L)   Chloride 99  96 - 112 (mEq/L)   CO2 23  19 - 32 (mEq/L)   Glucose, Bld 102 (*) 70 - 99 (mg/dL)   BUN 12  6 - 23 (mg/dL)   Creatinine, Ser 8.29  0.50 - 1.35 (mg/dL)   Calcium 9.4  8.4 - 56.2 (mg/dL)   GFR calc non Af Amer >60  >60 (mL/min)   GFR calc Af Amer >60  >60 (mL/min)    Dg Chest 2 View  02/24/2011  *RADIOLOGY REPORT*  Clinical Data: Shortness of breath, cough  CHEST - 2 VIEW  Comparison: 01/22/2009  Findings: Aorta is ectatic and unfolded. The heart size is normal. The lungs are clear.  No pleural effusion.  No acute osseous finding.  IMPRESSION: No acute cardiopulmonary process.  Original Report Authenticated By: Harrel Lemon, M.D.   Dg Chest Portable 1 View  03/01/2011   *RADIOLOGY REPORT*  Clinical Data: Wheezing, cough and congestion; fever.  History of asthma.  PORTABLE CHEST - 1 VIEW  Comparison: Chest radiograph performed 02/24/2011  Findings: The lungs are well-aerated.  Mild vascular congestion is noted.  There  is no evidence of focal opacification, pleural effusion or pneumothorax.  The cardiomediastinal silhouette is within normal limits.  No acute osseous abnormalities are seen.  IMPRESSION: Mild vascular congestion noted; lungs remain clear.  Original Report Authenticated By: Tonia Ghent, M.D.       Sunnie Nielsen, MD 03/01/11 [phone]

## 2011-03-01 NOTE — ED Notes (Signed)
Difficulty breathing x5-6days. Was seen here on Saturday and had breathing tx and discharged home with inhaler, which has not been helping. States "my chest is full of phlem, and i was given a prescription for prednisone, but i dont have money to fill it"

## 2011-04-12 ENCOUNTER — Encounter (HOSPITAL_BASED_OUTPATIENT_CLINIC_OR_DEPARTMENT_OTHER): Payer: Self-pay | Admitting: Family Medicine

## 2011-04-12 ENCOUNTER — Emergency Department (HOSPITAL_BASED_OUTPATIENT_CLINIC_OR_DEPARTMENT_OTHER)
Admission: EM | Admit: 2011-04-12 | Discharge: 2011-04-12 | Disposition: A | Discharge status: Home / Self Care | Payer: Self-pay | Attending: Emergency Medicine | Admitting: Emergency Medicine

## 2011-04-12 DIAGNOSIS — R040 Epistaxis: Secondary | ICD-10-CM | POA: Insufficient documentation

## 2011-04-12 DIAGNOSIS — E7800 Pure hypercholesterolemia, unspecified: Secondary | ICD-10-CM | POA: Insufficient documentation

## 2011-04-12 DIAGNOSIS — J45909 Unspecified asthma, uncomplicated: Secondary | ICD-10-CM | POA: Insufficient documentation

## 2011-04-12 DIAGNOSIS — I10 Essential (primary) hypertension: Secondary | ICD-10-CM | POA: Insufficient documentation

## 2011-04-12 DIAGNOSIS — Z79899 Other long term (current) drug therapy: Secondary | ICD-10-CM | POA: Insufficient documentation

## 2011-04-12 NOTE — ED Notes (Signed)
Pt states he may need to leave due to that he dropped his phone in water and needs to get replaced prior to store closing at 630pm-advised he was next to be seen but to let me know if needs to leave prior to being seen

## 2011-04-12 NOTE — ED Provider Notes (Signed)
History     CSN: [id number] Arrival date & time: 04/12/2011  4:41 PM  Chief Complaint  Patient presents with  . Epistaxis    (Consider location/radiation/quality/duration/timing/severity/associated sxs/prior treatment) Patient is a 45 y.o. male presenting with nosebleeds. The history is provided by the patient. No language interpreter was used.  Epistaxis  This is a new problem. The current episode started more than 1 week ago. The problem has not changed since onset.The problem is associated with an unknown factor. The bleeding has been from the right nare. He has tried nothing for the symptoms. His past medical history does not include sinus problems.  Pt complains of nosebleeds on and off for the last week.  Pt reports bleeding stops with pressure.    Past Medical History  Diagnosis Date  . Asthma   . Hypertension   . High cholesterol     History reviewed. No pertinent past surgical history.  No family history on file.  History  Substance Use Topics  . Smoking status: Never Smoker   . Smokeless tobacco: Not on file  . Alcohol Use: 1.2 oz/week    2 Cans of beer per week      Review of Systems  HENT: Positive for nosebleeds.   All other systems reviewed and are negative.    Allergies  Shellfish allergy  Home Medications   Current Outpatient Rx  Name Route Sig Dispense Refill  . ALBUTEROL SULFATE (2.5 MG/3ML) 0.083% IN NEBU Nebulization Take 2.5 mg by nebulization every 2 (two) hours as needed. For wheezing      . ALBUTEROL 90 MCG/ACT IN AERS Inhalation Inhale 4 puffs into the lungs 3 (three) times daily as needed. For shortness of breath     . AMLODIPINE BESYLATE 10 MG PO TABS Oral Take 10 mg by mouth daily.      . COLESEVELAM HCL 625 MG PO TABS Oral Take 1,250 mg by mouth every other day.      Marland Kitchen LISINOPRIL 20 MG PO TABS Oral Take 20 mg by mouth daily.      Marland Kitchen MONTELUKAST SODIUM 10 MG PO TABS Oral Take 10 mg by mouth daily.     Marland Kitchen LISINOPRIL 10 MG PO TABS Oral  Take 10 mg by mouth daily.      Marland Kitchen ROSUVASTATIN CALCIUM 20 MG PO TABS Oral Take 20 mg by mouth daily.        BP 159/100  Pulse 71  Temp(Src) 98.3 F (36.8 C) (Oral)  Resp 16  SpO2 98%  Physical Exam  Nursing note and vitals reviewed. Constitutional: He is oriented to person, place, and time. He appears well-developed and well-nourished.  HENT:  Head: Normocephalic.       Swollen nares bilat,  No bleeding  Eyes: Pupils are equal, round, and reactive to light.  Neck: Normal range of motion.  Cardiovascular: Normal rate.   Pulmonary/Chest: Effort normal.  Abdominal: Soft.  Musculoskeletal: Normal range of motion.  Neurological: He is alert and oriented to person, place, and time. He has normal reflexes.  Skin: Skin is warm.  Psychiatric: He has a normal mood and affect.    ED Course  Procedures (including critical care time)  Labs Reviewed - No data to display No results found.   1. Nosebleed       MDM  Pt counseled on nosebleeds,  i advised monitor blood pressure,  Afrin,  Return if bleeding continues after 15 minutes of pressure        Clydie Braun  [place], [place] 04/12/11 1736

## 2011-04-12 NOTE — ED Notes (Addendum)
Pt sts "my nose has been bleeding every day for about 15 mins". Pt denies h/a, visual changes. No bleeding at this time.

## 2011-04-27 NOTE — ED Provider Notes (Signed)
Evaluation and management procedures were performed by the PA/NP under my supervision/collaboration.    Damek Ende D Truman Aceituno, MD 04/27/11 1806 

## 2011-08-20 ENCOUNTER — Encounter (HOSPITAL_BASED_OUTPATIENT_CLINIC_OR_DEPARTMENT_OTHER): Payer: Self-pay | Admitting: *Deleted

## 2011-08-20 ENCOUNTER — Emergency Department (HOSPITAL_BASED_OUTPATIENT_CLINIC_OR_DEPARTMENT_OTHER)
Admission: EM | Admit: 2011-08-20 | Discharge: 2011-08-20 | Disposition: A | Discharge status: Home / Self Care | Payer: Medicaid Other | Attending: Emergency Medicine | Admitting: Emergency Medicine

## 2011-08-20 DIAGNOSIS — I10 Essential (primary) hypertension: Secondary | ICD-10-CM | POA: Insufficient documentation

## 2011-08-20 DIAGNOSIS — R0602 Shortness of breath: Secondary | ICD-10-CM | POA: Insufficient documentation

## 2011-08-20 DIAGNOSIS — E7800 Pure hypercholesterolemia, unspecified: Secondary | ICD-10-CM | POA: Insufficient documentation

## 2011-08-20 DIAGNOSIS — Z79899 Other long term (current) drug therapy: Secondary | ICD-10-CM | POA: Insufficient documentation

## 2011-08-20 DIAGNOSIS — J45909 Unspecified asthma, uncomplicated: Secondary | ICD-10-CM | POA: Insufficient documentation

## 2011-08-20 MED ORDER — ALBUTEROL SULFATE HFA 108 (90 BASE) MCG/ACT IN AERS
2.0000 | INHALATION_SPRAY | RESPIRATORY_TRACT | Status: DC | PRN
  Administered 2011-08-20: 2 via RESPIRATORY_TRACT
  Filled 2011-08-20: qty 6.7

## 2011-08-20 NOTE — ED Provider Notes (Signed)
History     CSN: [id number]  Arrival date & time 08/20/11  1240   First MD Initiated Contact with Patient 08/20/11 1259      Chief Complaint  Patient presents with  . Shortness of Breath    (Consider location/radiation/quality/duration/timing/severity/associated sxs/prior treatment) Patient is a 46 y.o. male presenting with shortness of breath. The history is provided by the patient.  Shortness of Breath  Associated symptoms include cough, shortness of breath and wheezing. Pertinent negatives include no chest pain and no fever.   patient has a history of asthma. He has had 2 weeks of wheezing and shortness of breath. Occasional cough. No production. No chest pain. It is like his typical asthma. He states he is out of his albuterol. Mild dyspnea. No fevers.   Past Medical History  Diagnosis Date  . Asthma   . Hypertension   . High cholesterol     History reviewed. No pertinent past surgical history.  History reviewed. No pertinent family history.  History  Substance Use Topics  . Smoking status: Never Smoker   . Smokeless tobacco: Not on file  . Alcohol Use: 1.2 oz/week    2 Cans of beer per week      Review of Systems  Constitutional: Negative for fever and fatigue.  Respiratory: Positive for cough, shortness of breath and wheezing.   Cardiovascular: Negative for chest pain and leg swelling.  Gastrointestinal: Negative for abdominal pain.  Genitourinary: Negative for flank pain.    Allergies  Shellfish allergy  Home Medications   Current Outpatient Rx  Name Route Sig Dispense Refill  . ALBUTEROL SULFATE (2.5 MG/3ML) 0.083% IN NEBU Nebulization Take 2.5 mg by nebulization every 2 (two) hours as needed. For wheezing      . ALBUTEROL 90 MCG/ACT IN AERS Inhalation Inhale 4 puffs into the lungs 3 (three) times daily as needed. For shortness of breath     . AMLODIPINE BESYLATE 10 MG PO TABS Oral Take 10 mg by mouth daily.      . COLESEVELAM HCL 625 MG PO TABS  Oral Take 1,250 mg by mouth every other day.      Marland Kitchen LISINOPRIL 10 MG PO TABS Oral Take 10 mg by mouth daily.      Marland Kitchen LISINOPRIL 20 MG PO TABS Oral Take 20 mg by mouth daily.      Marland Kitchen MONTELUKAST SODIUM 10 MG PO TABS Oral Take 10 mg by mouth daily.     Marland Kitchen ROSUVASTATIN CALCIUM 20 MG PO TABS Oral Take 20 mg by mouth daily.        BP 157/92  Pulse 77  Temp(Src) 98.4 F (36.9 C) (Oral)  Resp 20  SpO2 96%  Physical Exam  Constitutional: He appears well-developed.  Cardiovascular: Normal rate.   Pulmonary/Chest: He has wheezes. He has no rales.       Mild wheezing and prolonged expirations throughout  Abdominal: He exhibits no distension. There is no tenderness.    ED Course  Procedures (including critical care time)  Labs Reviewed - No data to display No results found.   1. Asthma       MDM  Shortness of breath and wheezing. History of asthma. He is not as his albuterol. His lungs do not show focal findings. He'll be discharged with an inhaler         Harrold Donath R. Rubin Payor, MD 08/20/11 1320

## 2011-08-20 NOTE — ED Notes (Signed)
Pt amb to triage with quick steady gait in nad. Pt reports 2 weeks of wheezing and sob, rt in room assessing pt. Pt reports feeling sob today, denies any other c/o.

## 2011-09-12 ENCOUNTER — Emergency Department (INDEPENDENT_AMBULATORY_CARE_PROVIDER_SITE_OTHER): Payer: Medicaid Other

## 2011-09-12 ENCOUNTER — Encounter (HOSPITAL_BASED_OUTPATIENT_CLINIC_OR_DEPARTMENT_OTHER): Payer: Self-pay

## 2011-09-12 ENCOUNTER — Emergency Department (HOSPITAL_BASED_OUTPATIENT_CLINIC_OR_DEPARTMENT_OTHER)
Admission: EM | Admit: 2011-09-12 | Discharge: 2011-09-12 | Disposition: A | Discharge status: Home Health | Payer: Medicaid Other | Attending: Emergency Medicine | Admitting: Emergency Medicine

## 2011-09-12 DIAGNOSIS — I10 Essential (primary) hypertension: Secondary | ICD-10-CM

## 2011-09-12 DIAGNOSIS — J45901 Unspecified asthma with (acute) exacerbation: Secondary | ICD-10-CM | POA: Insufficient documentation

## 2011-09-12 DIAGNOSIS — E7800 Pure hypercholesterolemia, unspecified: Secondary | ICD-10-CM | POA: Insufficient documentation

## 2011-09-12 DIAGNOSIS — R079 Chest pain, unspecified: Secondary | ICD-10-CM

## 2011-09-12 DIAGNOSIS — J45909 Unspecified asthma, uncomplicated: Secondary | ICD-10-CM

## 2011-09-12 DIAGNOSIS — R0602 Shortness of breath: Secondary | ICD-10-CM | POA: Insufficient documentation

## 2011-09-12 IMAGING — CR DG CHEST 2V
2 series · 2 of 2 positions shown · non-contrast
Comparison: [DATE]

CLINICAL DATA: Asthma, chest pain.  Hypertension.

CHEST - 2 VIEW

[w chest pa]
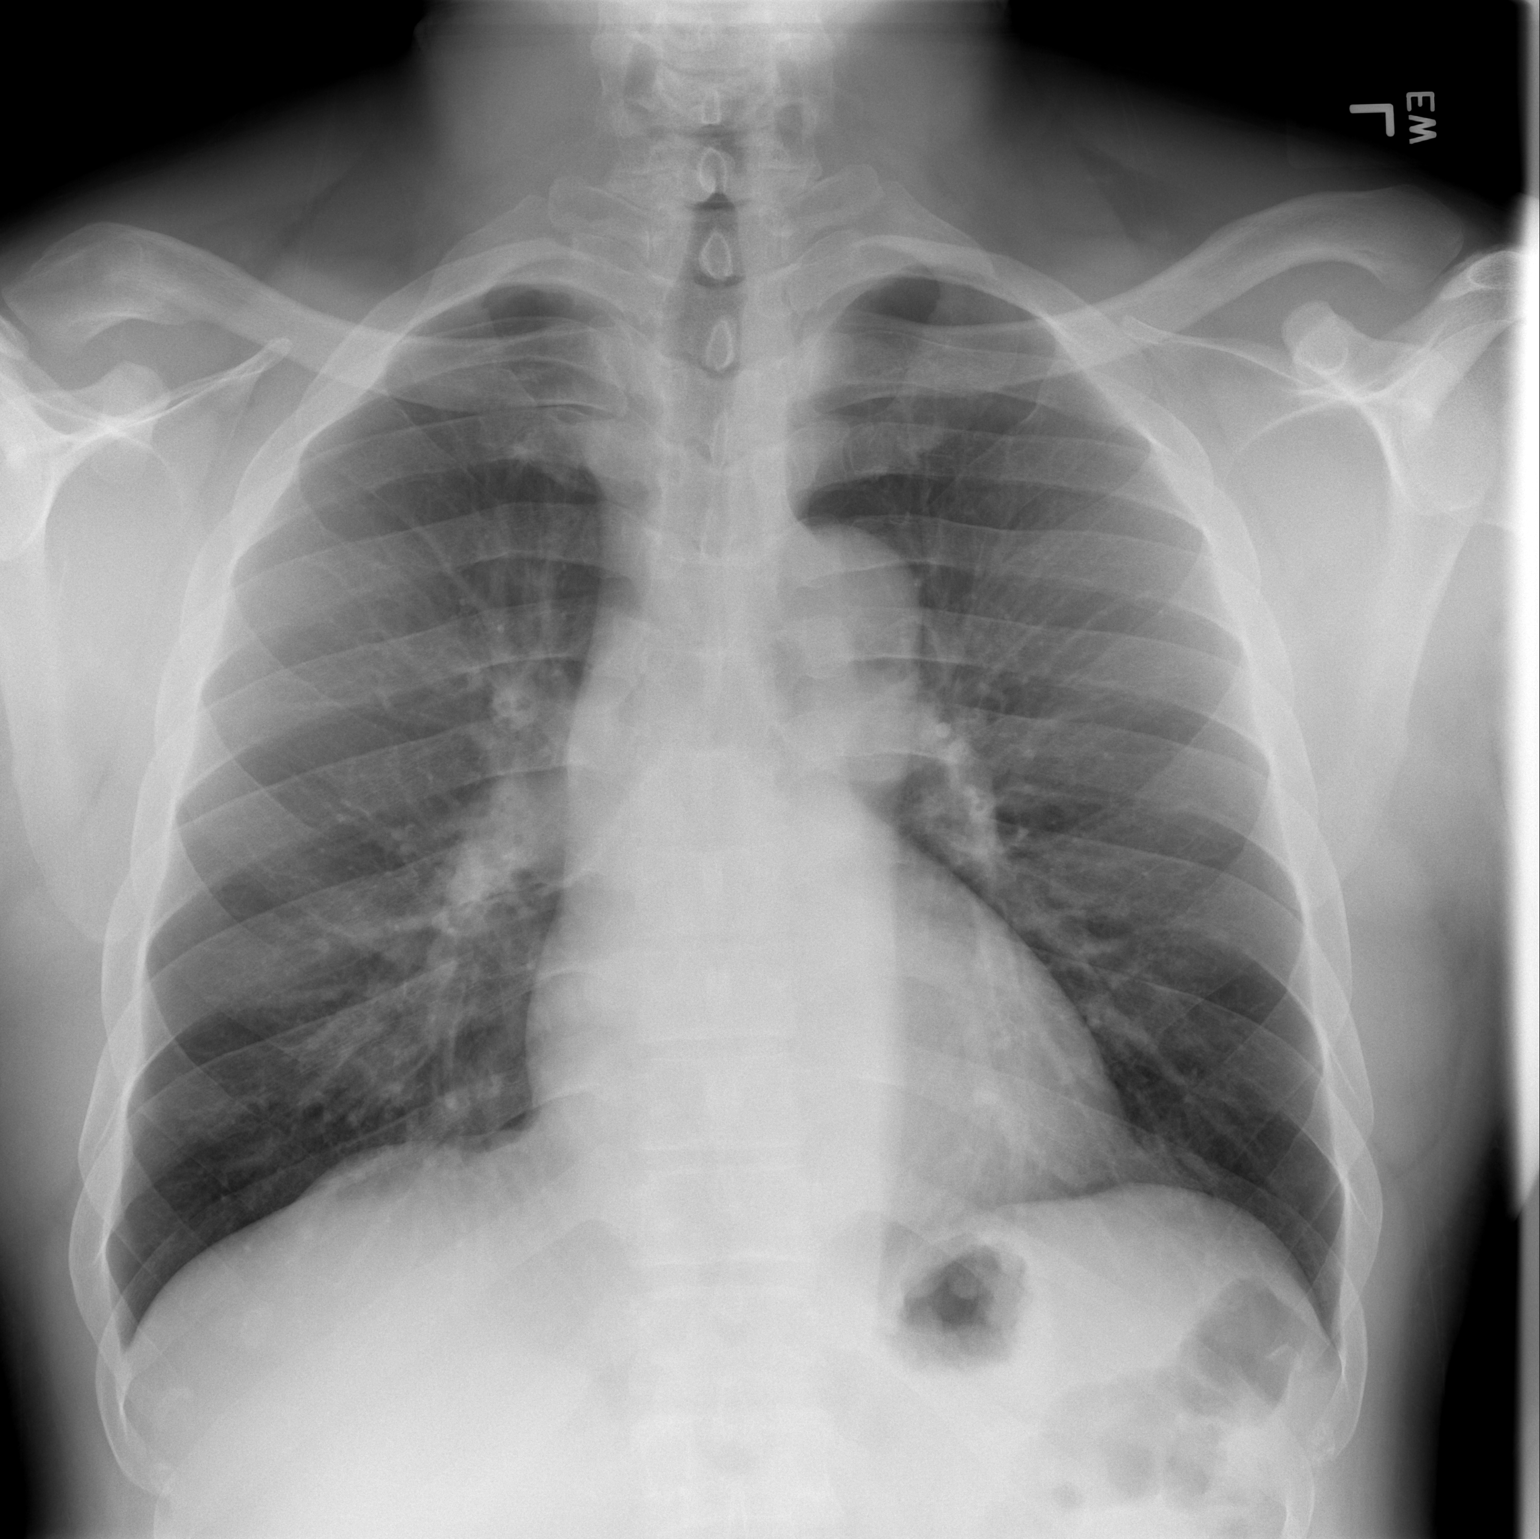

[w chest lat]
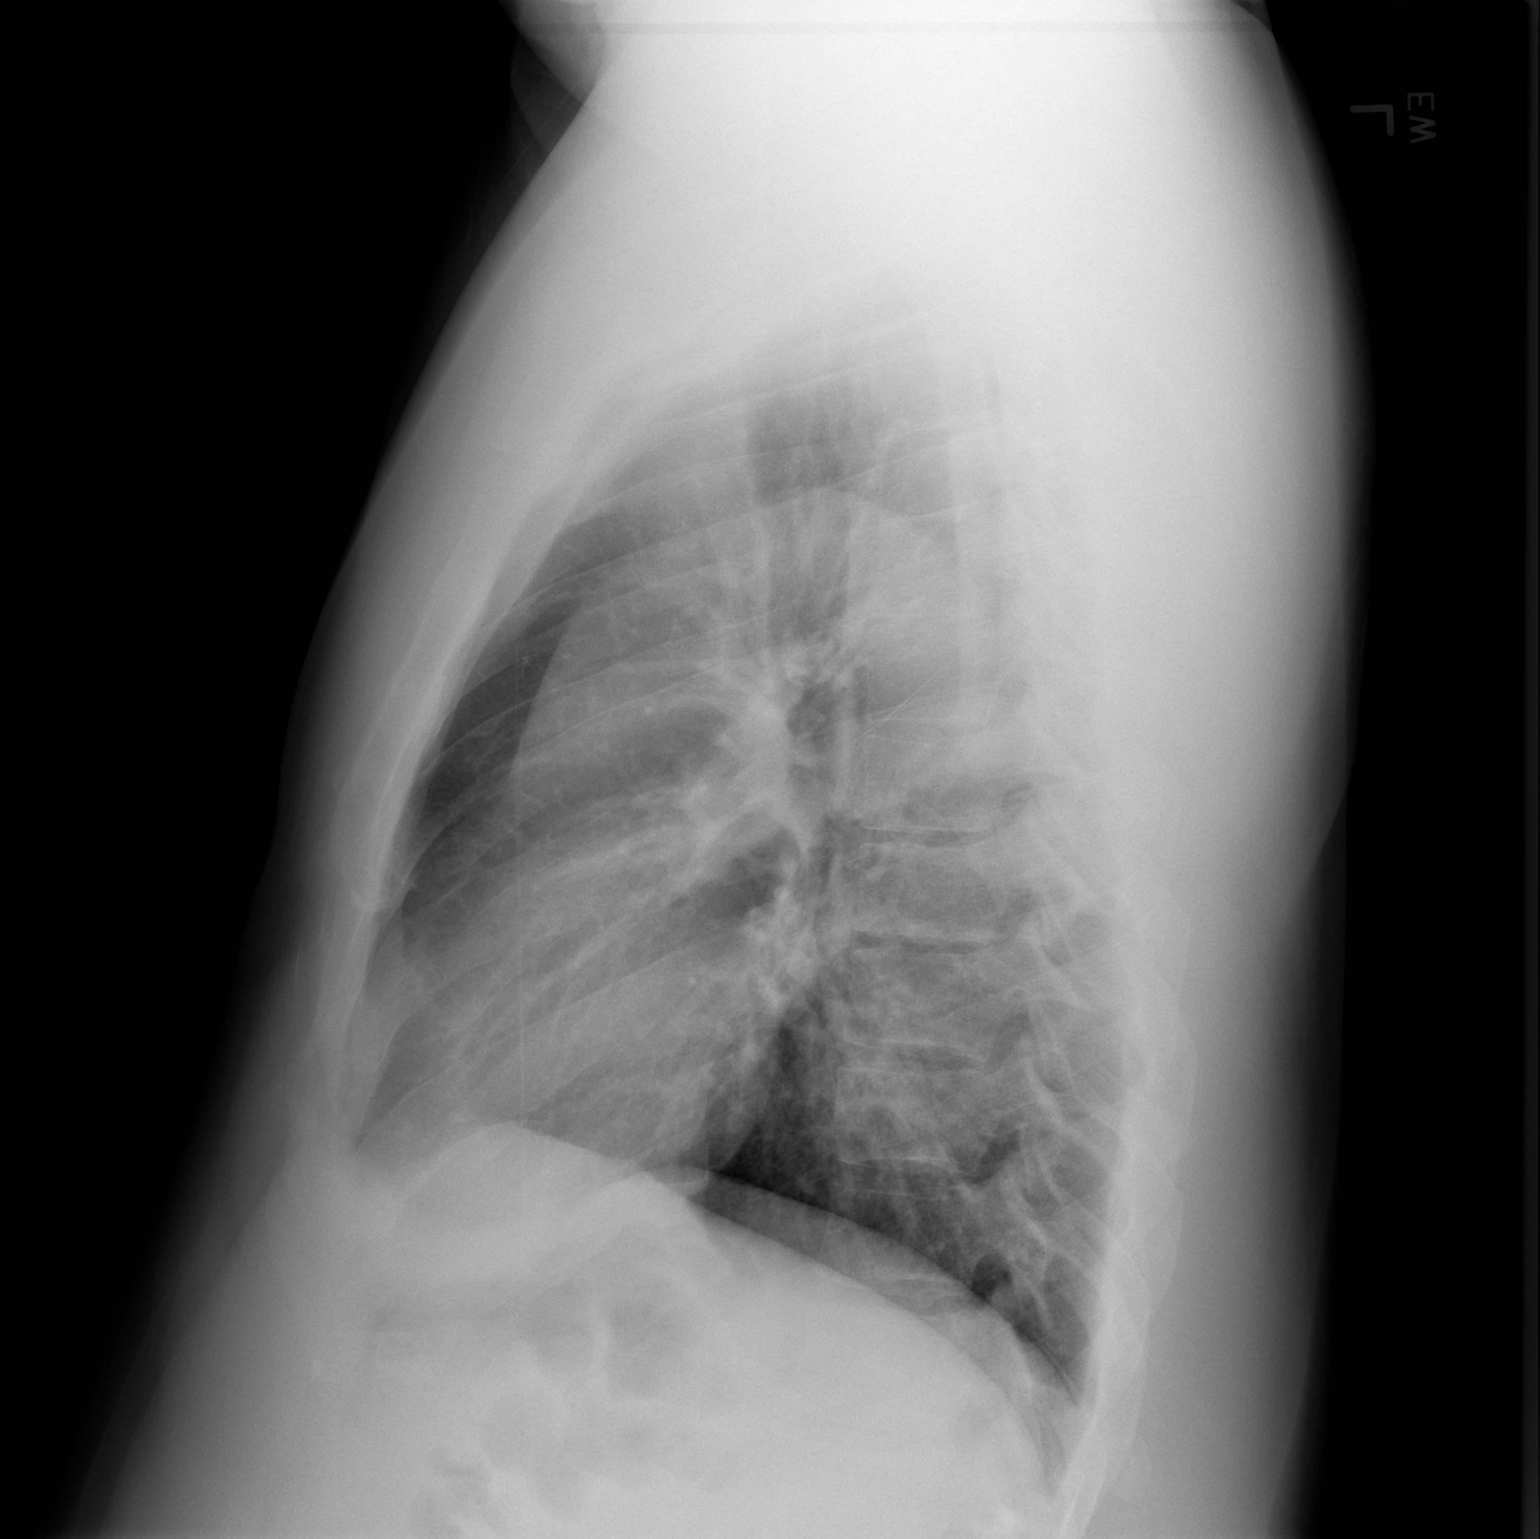

[2 of 2 positions shown; findings below may reference images not displayed]

FINDINGS: Heart and mediastinal contours are within normal limits.
No focal opacities or effusions.  No acute bony abnormality.
IMPRESSION: No active cardiopulmonary disease.

## 2011-09-12 MED ORDER — IPRATROPIUM BROMIDE 0.02 % IN SOLN
0.5000 mg | Freq: Once | RESPIRATORY_TRACT | Status: AC
  Administered 2011-09-12: 0.5 mg via RESPIRATORY_TRACT
  Filled 2011-09-12: qty 2.5

## 2011-09-12 MED ORDER — PREDNISONE 50 MG PO TABS
60.0000 mg | ORAL_TABLET | Freq: Once | ORAL | Status: AC
  Administered 2011-09-12: 60 mg via ORAL
  Filled 2011-09-12: qty 1

## 2011-09-12 MED ORDER — PREDNISONE 20 MG PO TABS: 40.0000 mg | ORAL_TABLET | Freq: Every day | ORAL | Status: AC

## 2011-09-12 MED ORDER — ALBUTEROL SULFATE (5 MG/ML) 0.5% IN NEBU
5.0000 mg | INHALATION_SOLUTION | Freq: Once | RESPIRATORY_TRACT | Status: AC
  Administered 2011-09-12: 5 mg via RESPIRATORY_TRACT
  Filled 2011-09-12: qty 0.5

## 2011-09-12 MED ORDER — ALBUTEROL SULFATE HFA 108 (90 BASE) MCG/ACT IN AERS
2.0000 | INHALATION_SPRAY | RESPIRATORY_TRACT | Status: DC | PRN
  Administered 2011-09-12: 2 via RESPIRATORY_TRACT
  Filled 2011-09-12: qty 6.7

## 2011-09-12 NOTE — ED Provider Notes (Addendum)
History     CSN: [id number]  Arrival date & time 09/12/11  0814   First MD Initiated Contact with Patient 09/12/11 0820      Chief Complaint  Patient presents with  . Asthma  . Wheezing  . Shortness of Breath    (Consider location/radiation/quality/duration/timing/severity/associated sxs/prior treatment) Patient is a 46 y.o. male presenting with asthma, wheezing, and shortness of breath. The history is provided by the patient.  Asthma This is a recurrent problem. Episode onset: 3 days ago. The problem occurs constantly. The problem has been gradually worsening. Associated symptoms include shortness of breath. Pertinent negatives include no chest pain. The symptoms are aggravated by walking and exertion. The symptoms are relieved by nothing. Treatments tried: was using albuterol but ran out yesterday and breathing just getting worse. The treatment provided no relief.  Wheezing  Associated symptoms include shortness of breath and wheezing. Pertinent negatives include no chest pain. His past medical history is significant for asthma.  Shortness of Breath  Associated symptoms include shortness of breath and wheezing. Pertinent negatives include no chest pain. His past medical history is significant for asthma.    Past Medical History  Diagnosis Date  . Asthma   . Hypertension   . High cholesterol     History reviewed. No pertinent past surgical history.  No family history on file.  History  Substance Use Topics  . Smoking status: Never Smoker   . Smokeless tobacco: Not on file  . Alcohol Use: 1.2 oz/week    2 Cans of beer per week      Review of Systems  Respiratory: Positive for shortness of breath and wheezing.   Cardiovascular: Negative for chest pain.  All other systems reviewed and are negative.    Allergies  Shellfish allergy  Home Medications   Current Outpatient Rx  Name Route Sig Dispense Refill  . ALBUTEROL SULFATE (2.5 MG/3ML) 0.083% IN NEBU  Nebulization Take 2.5 mg by nebulization every 2 (two) hours as needed. For wheezing      . ALBUTEROL 90 MCG/ACT IN AERS Inhalation Inhale 4 puffs into the lungs 3 (three) times daily as needed. For shortness of breath     . AMLODIPINE BESYLATE 10 MG PO TABS Oral Take 10 mg by mouth daily.      . COLESEVELAM HCL 625 MG PO TABS Oral Take 1,250 mg by mouth every other day.      Marland Kitchen LISINOPRIL 10 MG PO TABS Oral Take 10 mg by mouth daily.      Marland Kitchen LISINOPRIL 20 MG PO TABS Oral Take 20 mg by mouth daily.      Marland Kitchen MONTELUKAST SODIUM 10 MG PO TABS Oral Take 10 mg by mouth daily.     Marland Kitchen ROSUVASTATIN CALCIUM 20 MG PO TABS Oral Take 20 mg by mouth daily.        BP 165/108  Pulse 86  Temp(Src) 98.4 F (36.9 C) (Oral)  Resp 18  Ht 6\' 3"  (1.905 m)  Wt 245 lb (111.131 kg)  BMI 30.62 kg/m2  SpO2 93%  Physical Exam  Constitutional: He is oriented to person, place, and time. He appears well-developed and well-nourished. No distress.  HENT:  Head: Normocephalic and atraumatic.  Right Ear: External ear normal.  Left Ear: External ear normal.  Mouth/Throat: Oropharynx is clear and moist.  Eyes: Conjunctivae and EOM are normal. Pupils are equal, round, and reactive to light. Right eye exhibits no discharge.  Neck: Normal range of motion. Neck supple.  Cardiovascular: Normal rate, regular rhythm, normal heart sounds and intact distal pulses.   No murmur heard. Pulmonary/Chest: Not tachypneic. No respiratory distress. He has no decreased breath sounds. He has wheezes in the right upper field, the right middle field, the right lower field, the left upper field, the left middle field and the left lower field. He has no rhonchi. He has no rales.  Abdominal: Soft. There is no tenderness.  Musculoskeletal: Normal range of motion. He exhibits no edema and no tenderness.  Neurological: He is alert and oriented to person, place, and time.  Skin: Skin is warm and dry. No rash noted.  Psychiatric: He has a normal mood  and affect.    ED Course  Procedures (including critical care time)  Labs Reviewed - No data to display Dg Chest 2 View  09/12/2011  *RADIOLOGY REPORT*  Clinical Data: Asthma, chest pain.  Hypertension.  CHEST - 2 VIEW  Comparison: 03/01/2011  Findings: Heart and mediastinal contours are within normal limits. No focal opacities or effusions.  No acute bony abnormality.  IMPRESSION: No active cardiopulmonary disease.  Original Report Authenticated By: Cyndie Chime, M.D.     1. Asthma exacerbation       MDM   Pt with typical asthma exacerbation  symptoms.  No infectious sx, productive cough or other complaints.  Wheezing on exam.  will give steroids, albuterol/atrovent and recheck.  9:17 AM Patient feeling better and Friday to go home. Wheezing improved. Patient was given a new inhaler and steroids.       Gwyneth Sprout, MD 09/12/11 1610  Gwyneth Sprout, MD 09/12/11 [phone]

## 2011-09-12 NOTE — ED Notes (Signed)
RRT administering neb tx 

## 2011-09-12 NOTE — ED Notes (Signed)
Pt reports an "asthma attack" x 2 days. Out of inhaler

## 2011-09-12 NOTE — Discharge Instructions (Signed)
Asthma Attack Prevention HOW CAN ASTHMA BE PREVENTED? Currently, there is no way to prevent asthma from starting. However, you can take steps to control the disease and prevent its symptoms after you have been diagnosed. Learn about your asthma and how to control it. Take an active role to control your asthma by working with your caregiver to create and follow an asthma action plan. An asthma action plan guides you in taking your medicines properly, avoiding factors that make your asthma worse, tracking your level of asthma control, responding to worsening asthma, and seeking emergency care when needed. To track your asthma, keep records of your symptoms, check your peak flow number using a peak flow meter (handheld device that shows how well air moves out of your lungs), and get regular asthma checkups.  Other ways to prevent asthma attacks include:  Use medicines as your caregiver directs.   Identify and avoid things that make your asthma worse (as much as you can).   Keep track of your asthma symptoms and level of control.   Get regular checkups for your asthma.   With your caregiver, write a detailed plan for taking medicines and managing an asthma attack. Then be sure to follow your action plan. Asthma is an ongoing condition that needs regular monitoring and treatment.   Identify and avoid asthma triggers. A number of outdoor allergens and irritants (pollen, mold, cold air, air pollution) can trigger asthma attacks. Find out what causes or makes your asthma worse, and take steps to avoid those triggers (see below).   Monitor your breathing. Learn to recognize warning signs of an attack, such as slight coughing, wheezing or shortness of breath. However, your lung function may already decrease before you notice any signs or symptoms, so regularly measure and record your peak airflow with a home peak flow meter.   Identify and treat attacks early. If you act quickly, you're less likely to have  a severe attack. You will also need less medicine to control your symptoms. When your peak flow measurements decrease and alert you to an upcoming attack, take your medicine as instructed, and immediately stop any activity that may have triggered the attack. If your symptoms do not improve, get medical help.   Pay attention to increasing quick-relief inhaler use. If you find yourself relying on your quick-relief inhaler (such as albuterol), your asthma is not under control. See your caregiver about adjusting your treatment.  IDENTIFY AND CONTROL FACTORS THAT MAKE YOUR ASTHMA WORSE A number of common things can set off or make your asthma symptoms worse (asthma triggers). Keep track of your asthma symptoms for several weeks, detailing all the environmental and emotional factors that are linked with your asthma. When you have an asthma attack, go back to your asthma diary to see which factor, or combination of factors, might have contributed to it. Once you know what these factors are, you can take steps to control many of them.  Allergies: If you have allergies and asthma, it is important to take asthma prevention steps at home. Asthma attacks (worsening of asthma symptoms) can be triggered by allergies, which can cause temporary increased inflammation of your airways. Minimizing contact with the substance to which you are allergic will help prevent an asthma attack. Animal Dander:   Some people are allergic to the flakes of skin or dried saliva from animals with fur or feathers. Keep these pets out of your home.   If you can't keep a pet outdoors, keep the   pet out of your bedroom and other sleeping areas at all times, and keep the door closed.   Remove carpets and furniture covered with cloth from your home. If that is not possible, keep the pet away from fabric-covered furniture and carpets.  Dust Mites:  Many people with asthma are allergic to dust mites. Dust mites are tiny bugs that are found in  every home, in mattresses, pillows, carpets, fabric-covered furniture, bedcovers, clothes, stuffed toys, fabric, and other fabric-covered items.   Cover your mattress in a special dust-proof cover.   Cover your pillow in a special dust-proof cover, or wash the pillow each week in hot water. Water must be hotter than 130 F to kill dust mites. Cold or warm water used with detergent and bleach can also be effective.   Wash the sheets and blankets on your bed each week in hot water.   Try not to sleep or lie on cloth-covered cushions.   Call ahead when traveling and ask for a smoke-free hotel room. Bring your own bedding and pillows, in case the hotel only supplies feather pillows and down comforters, which may contain dust mites and cause asthma symptoms.   Remove carpets from your bedroom and those laid on concrete, if you can.   Keep stuffed toys out of the bed, or wash the toys weekly in hot water or cooler water with detergent and bleach.  Cockroaches:  Many people with asthma are allergic to the droppings and remains of cockroaches.   Keep food and garbage in closed containers. Never leave food out.   Use poison baits, traps, powders, gels, or paste (for example, boric acid).   If a spray is used to kill cockroaches, stay out of the room until the odor goes away.  Indoor Mold:  Fix leaky faucets, pipes, or other sources of water that have mold around them.   Clean moldy surfaces with a cleaner that has bleach in it.  Pollen and Outdoor Mold:  When pollen or mold spore counts are high, try to keep your windows closed.   Stay indoors with windows closed from late morning to afternoon, if you can. Pollen and some mold spore counts are highest at that time.   Ask your caregiver whether you need to take or increase anti-inflammatory medicine before your allergy season starts.  Irritants:   Tobacco smoke is an irritant. If you smoke, ask your caregiver how you can quit. Ask family  members to quit smoking, too. Do not allow smoking in your home or car.   If possible, do not use a wood-burning stove, kerosene heater, or fireplace. Minimize exposure to all sources of smoke, including incense, candles, fires, and fireworks.   Try to stay away from strong odors and sprays, such as perfume, talcum powder, hair spray, and paints.   Decrease humidity in your home and use an indoor air cleaning device. Reduce indoor humidity to below 60 percent. Dehumidifiers or central air conditioners can do this.   Try to have someone else vacuum for you once or twice a week, if you can. Stay out of rooms while they are being vacuumed and for a short while afterward.   If you vacuum, use a dust mask from a hardware store, a double-layered or microfilter vacuum cleaner bag, or a vacuum cleaner with a HEPA filter.   Sulfites in foods and beverages can be irritants. Do not drink beer or wine, or eat dried fruit, processed potatoes, or shrimp if they cause asthma   symptoms.   Cold air can trigger an asthma attack. Cover your nose and mouth with a scarf on cold or windy days.   Several health conditions can make asthma more difficult to manage, including runny nose, sinus infections, reflux disease, psychological stress, and sleep apnea. Your caregiver will treat these conditions, as well.   Avoid close contact with people who have a cold or the flu, since your asthma symptoms may get worse if you catch the infection from them. Wash your hands thoroughly after touching items that may have been handled by people with a respiratory infection.   Get a flu shot every year to protect against the flu virus, which often makes asthma worse for days or weeks. Also get a pneumonia shot once every five to 10 years.  Drugs:  Aspirin and other painkillers can cause asthma attacks. 10% to 20% of people with asthma have sensitivity to aspirin or a group of painkillers called non-steroidal anti-inflammatory drugs  (NSAIDS), such as ibuprofen and naproxen. These drugs are used to treat pain and reduce fevers. Asthma attacks caused by any of these medicines can be severe and even fatal. These drugs must be avoided in people who have known aspirin sensitive asthma. Products with acetaminophen are considered safe for people who have asthma. It is important that people with aspirin sensitivity read labels of all over-the-counter drugs used to treat pain, colds, coughs, and fever.   Beta blockers and ACE inhibitors are other drugs which you should discuss with your caregiver, in relation to your asthma.  ALLERGY SKIN TESTING  Ask your asthma caregiver about allergy skin testing or blood testing (RAST test) to identify the allergens to which you are sensitive. If you are found to have allergies, allergy shots (immunotherapy) for asthma may help prevent future allergies and asthma. With allergy shots, small doses of allergens (substances to which you are allergic) are injected under your skin on a regular schedule. Over a period of time, your body may become used to the allergen and less responsive with asthma symptoms. You can also take measures to minimize your exposure to those allergens. EXERCISE  If you have exercise-induced asthma, or are planning vigorous exercise, or exercise in cold, humid, or dry environments, prevent exercise-induced asthma by following your caregiver's advice regarding asthma treatment before exercising. Document Released: 06/13/2009 Document Revised: 06/14/2011 Document Reviewed: 06/13/2009 [employer] Patient Information 2012 [employer]. 

## 2012-01-13 ENCOUNTER — Emergency Department (HOSPITAL_BASED_OUTPATIENT_CLINIC_OR_DEPARTMENT_OTHER): Payer: Medicaid Other

## 2012-01-13 ENCOUNTER — Emergency Department (HOSPITAL_BASED_OUTPATIENT_CLINIC_OR_DEPARTMENT_OTHER)
Admission: EM | Admit: 2012-01-13 | Discharge: 2012-01-13 | Disposition: A | Discharge status: Home / Self Care | Payer: Medicaid Other | Attending: Emergency Medicine | Admitting: Emergency Medicine

## 2012-01-13 ENCOUNTER — Encounter (HOSPITAL_BASED_OUTPATIENT_CLINIC_OR_DEPARTMENT_OTHER): Payer: Self-pay | Admitting: *Deleted

## 2012-01-13 DIAGNOSIS — R0609 Other forms of dyspnea: Secondary | ICD-10-CM | POA: Insufficient documentation

## 2012-01-13 DIAGNOSIS — I10 Essential (primary) hypertension: Secondary | ICD-10-CM | POA: Insufficient documentation

## 2012-01-13 DIAGNOSIS — E7800 Pure hypercholesterolemia, unspecified: Secondary | ICD-10-CM | POA: Insufficient documentation

## 2012-01-13 DIAGNOSIS — R110 Nausea: Secondary | ICD-10-CM | POA: Insufficient documentation

## 2012-01-13 DIAGNOSIS — R0789 Other chest pain: Secondary | ICD-10-CM | POA: Insufficient documentation

## 2012-01-13 DIAGNOSIS — R05 Cough: Secondary | ICD-10-CM | POA: Insufficient documentation

## 2012-01-13 DIAGNOSIS — Z79899 Other long term (current) drug therapy: Secondary | ICD-10-CM | POA: Insufficient documentation

## 2012-01-13 DIAGNOSIS — J45901 Unspecified asthma with (acute) exacerbation: Secondary | ICD-10-CM

## 2012-01-13 DIAGNOSIS — R059 Cough, unspecified: Secondary | ICD-10-CM | POA: Insufficient documentation

## 2012-01-13 DIAGNOSIS — R0989 Other specified symptoms and signs involving the circulatory and respiratory systems: Secondary | ICD-10-CM | POA: Insufficient documentation

## 2012-01-13 IMAGING — CR DG CHEST 1V PORT
1 series · 1 of 1 positions shown · non-contrast
Comparison: [DATE]; [DATE]; [DATE]; [DATE]

CLINICAL DATA: Cough, history of asthma

PORTABLE CHEST - 1 VIEW

[view not recorded]
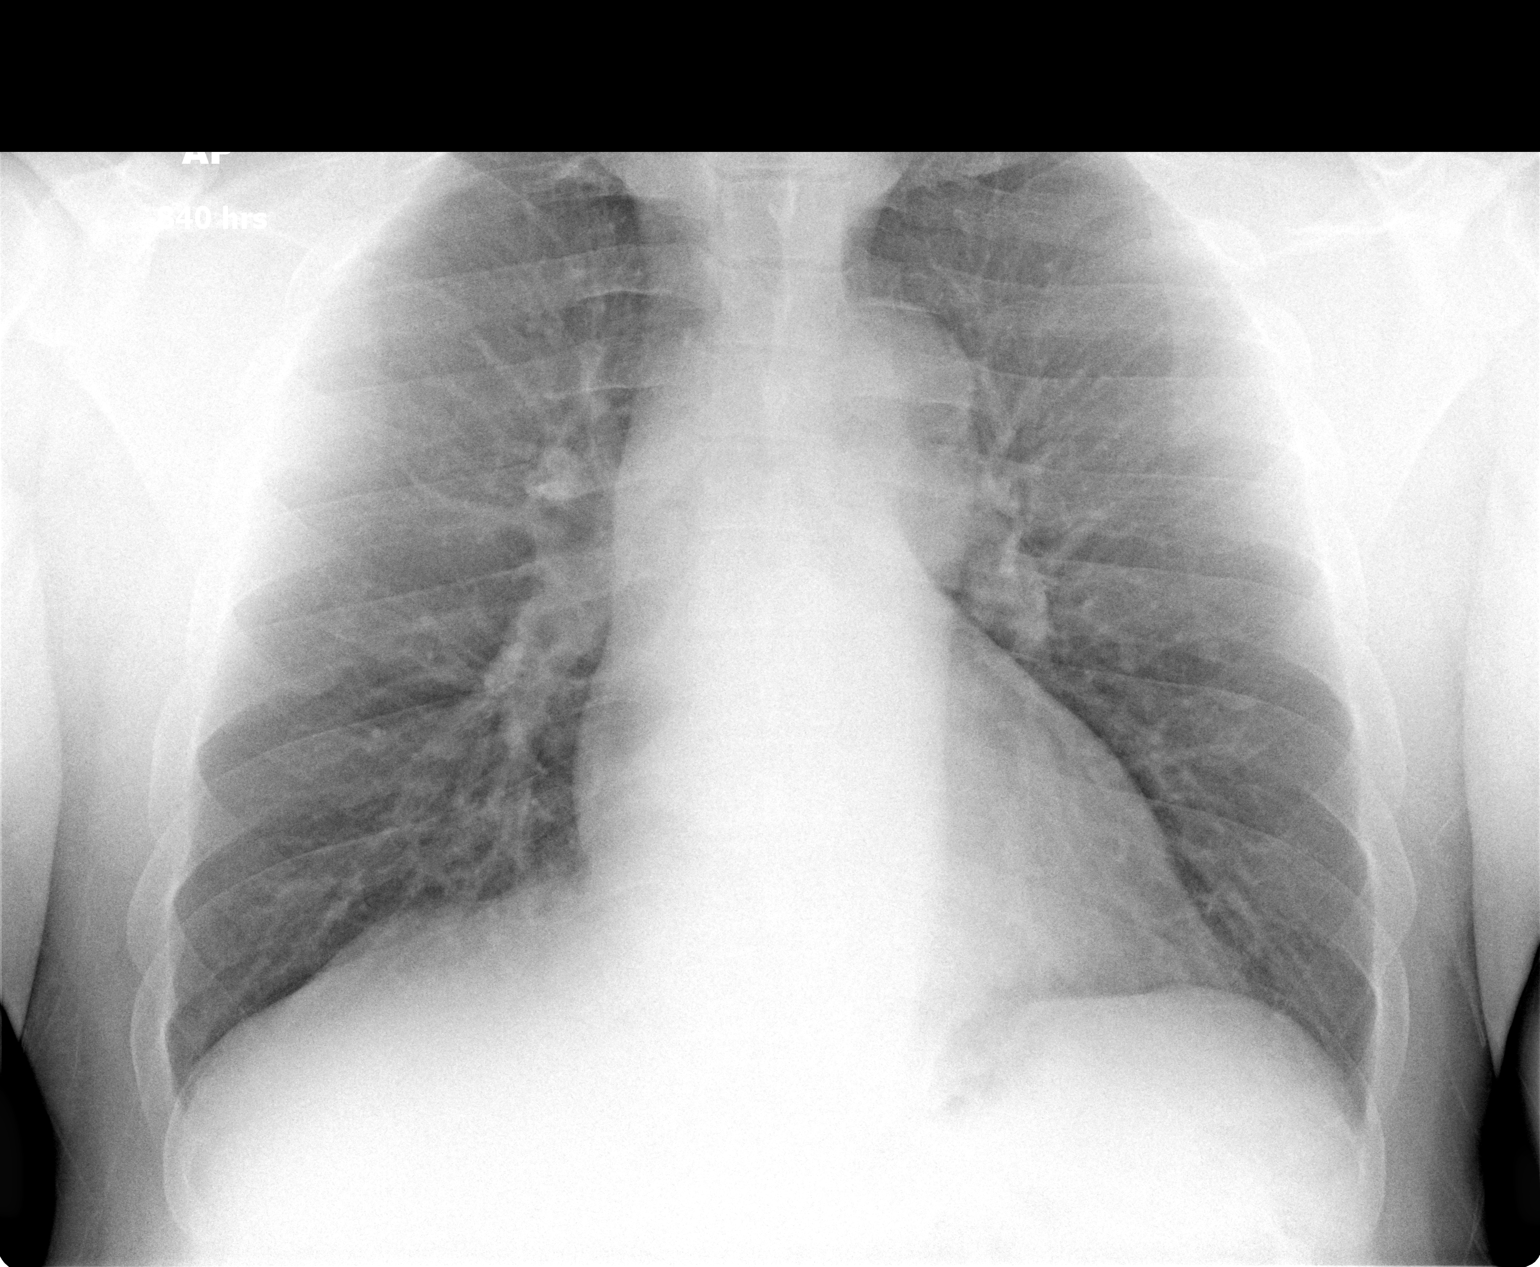

[1 of 1 positions shown; findings below may reference images not displayed]

FINDINGS: Grossly unchanged cardiac silhouette and mediastinal contours with
mild prominence of the aortic arch.  There is mild diffuse
increased conspicuity of the pulmonary interstitium, possibly
accentuated due to technique.  No focal airspace opacities.  There
is mild eventration of the medial aspect of right hemidiaphragm.
No pleural effusion or pneumothorax.  Unchanged bones.
IMPRESSION: Mild diffuse increased conspicuity the pulmonary interstitium,
nonspecific but may be seen in the setting of airways disease,
though atypical infection may have a similar appearance.

## 2012-01-13 MED ORDER — ALBUTEROL SULFATE 2 MG PO TABS: 2.0000 mg | ORAL_TABLET | Freq: Three times a day (TID) | ORAL | Status: DC

## 2012-01-13 MED ORDER — ALBUTEROL (5 MG/ML) CONTINUOUS INHALATION SOLN
10.0000 mg/h | INHALATION_SOLUTION | Freq: Once | RESPIRATORY_TRACT | Status: AC
  Administered 2012-01-13: 10 mg/h via RESPIRATORY_TRACT
  Filled 2012-01-13: qty 20
  Filled 2012-01-13: qty 0.5

## 2012-01-13 MED ORDER — IPRATROPIUM BROMIDE 0.02 % IN SOLN
RESPIRATORY_TRACT | Status: AC
  Filled 2012-01-13: qty 2.5

## 2012-01-13 MED ORDER — ALBUTEROL SULFATE HFA 108 (90 BASE) MCG/ACT IN AERS: 1.0000 | INHALATION_SPRAY | Freq: Four times a day (QID) | RESPIRATORY_TRACT | Status: DC | PRN

## 2012-01-13 MED ORDER — METHYLPREDNISOLONE SODIUM SUCC 125 MG IJ SOLR
125.0000 mg | Freq: Once | INTRAMUSCULAR | Status: AC
  Administered 2012-01-13: 125 mg via INTRAVENOUS
  Filled 2012-01-13: qty 2

## 2012-01-13 MED ORDER — SODIUM CHLORIDE 0.9 % IV BOLUS (SEPSIS)
1000.0000 mL | Freq: Once | INTRAVENOUS | Status: AC
  Administered 2012-01-13: 1000 mL via INTRAVENOUS

## 2012-01-13 MED ORDER — ALBUTEROL SULFATE HFA 108 (90 BASE) MCG/ACT IN AERS
2.0000 | INHALATION_SPRAY | Freq: Once | RESPIRATORY_TRACT | Status: AC
Start: 2012-01-13 — End: 2012-01-13
  Administered 2012-01-13: 2 via RESPIRATORY_TRACT
  Filled 2012-01-13: qty 6.7

## 2012-01-13 MED ORDER — IPRATROPIUM BROMIDE 0.02 % IN SOLN
0.5000 mg | RESPIRATORY_TRACT | Status: DC
  Administered 2012-01-13: 0.5 mg via RESPIRATORY_TRACT

## 2012-01-13 MED ORDER — MONTELUKAST SODIUM 10 MG PO TABS: 10.0000 mg | ORAL_TABLET | Freq: Every day | ORAL | Status: DC

## 2012-01-13 MED ORDER — PREDNISONE 20 MG PO TABS: 60.0000 mg | ORAL_TABLET | Freq: Every day | ORAL | Status: DC

## 2012-01-13 MED ORDER — ALBUTEROL SULFATE (5 MG/ML) 0.5% IN NEBU
INHALATION_SOLUTION | RESPIRATORY_TRACT | Status: AC
  Filled 2012-01-13: qty 1

## 2012-01-13 MED ORDER — ALBUTEROL SULFATE (5 MG/ML) 0.5% IN NEBU
2.5000 mg | INHALATION_SOLUTION | RESPIRATORY_TRACT | Status: DC
  Administered 2012-01-13: 2.5 mg via RESPIRATORY_TRACT

## 2012-01-13 NOTE — ED Notes (Addendum)
Patient states he has asthma and has had difficulty breathing this past week, used albuterol but no relief, ran out of albuterol yesterday

## 2012-01-13 NOTE — ED Provider Notes (Signed)
History     CSN: [id number]  Arrival date & time 01/13/12  0818   First MD Initiated Contact with Patient 01/13/12 [id number]      Chief Complaint  Patient presents with  . Asthma    (Consider location/radiation/quality/duration/timing/severity/associated sxs/prior treatment) HPI  H/o asthma pw shortness of breath and wheezing. Patient states that he's been wheezing and feeling short of breath for approximately one week. He's been taking his albuterol inhaler more than usual, up to 14 times per day. He ran out yesterday. The shortness of breath became worse today. He denies fevers, chills. He states he is coughing more the usual which is typical for his asthma exacerbation. He complains of chest tightness without chest pain. Mild nausea without vomiting. Denies abdominal pain, back pain. Denies sick contacts. He states that his triggers include "everything" including weather, allergies. No history of intubations for asthma  ED Notes, ED Provider Notes from 01/13/12 0000 to 01/13/12 08:29:36       Griffin Basil, RN 01/13/2012 08:24      Patient states he has asthma and has had difficulty breathing this past week, used albuterol but no relief, ran out of albuterol yesterday                 Original note by Griffin Basil, RN at 01/13/2012 08:23         Abbey Chatters Mabe, RN 01/13/2012 08:23      Patient states he has asthma and has had difficulty breathing this past week, used albuterol but no relief              Past Medical History  Diagnosis Date  . Asthma   . Hypertension   . High cholesterol     Past Surgical History  Procedure Date  . Abdominal surgery     No family history on file.  History  Substance Use Topics  . Smoking status: Never Smoker   . Smokeless tobacco: Not on file  . Alcohol Use: 1.2 oz/week    2 Cans of beer per week    Review of Systems  All other systems reviewed and are negative.   except as noted HPI   Allergies  Shellfish allergy  Home  Medications   Current Outpatient Rx  Name Route Sig Dispense Refill  . ALBUTEROL SULFATE HFA 108 (90 BASE) MCG/ACT IN AERS Inhalation Inhale 1-2 puffs into the lungs every 6 (six) hours as needed for wheezing. 1 Inhaler 5  . ALBUTEROL SULFATE (2.5 MG/3ML) 0.083% IN NEBU Nebulization Take 2.5 mg by nebulization every 2 (two) hours as needed. For wheezing      . ALBUTEROL SULFATE 2 MG PO TABS Oral Take 1 tablet (2 mg total) by mouth 3 (three) times daily. 30 tablet 0  . ALBUTEROL 90 MCG/ACT IN AERS Inhalation Inhale 4 puffs into the lungs 3 (three) times daily as needed. For shortness of breath     . AMLODIPINE BESYLATE 10 MG PO TABS Oral Take 10 mg by mouth daily.      . COLESEVELAM HCL 625 MG PO TABS Oral Take 1,250 mg by mouth every other day.      Marland Kitchen LISINOPRIL 10 MG PO TABS Oral Take 10 mg by mouth daily.      Marland Kitchen LISINOPRIL 20 MG PO TABS Oral Take 20 mg by mouth daily.      Marland Kitchen MONTELUKAST SODIUM 10 MG PO TABS Oral Take 10 mg by mouth daily.     Marland Kitchen  PREDNISONE 20 MG PO TABS Oral Take 3 tablets (60 mg total) by mouth daily. 15 tablet 0  . ROSUVASTATIN CALCIUM 20 MG PO TABS Oral Take 20 mg by mouth daily.        BP 174/98  Pulse 77  Temp 98.3 F (36.8 C)  Resp 21  SpO2 99%  Physical Exam  Nursing note and vitals reviewed. Constitutional: He is oriented to person, place, and time. He appears well-developed and well-nourished. No distress.  HENT:  Head: Atraumatic.  Mouth/Throat: Oropharynx is clear and moist.  Eyes: Conjunctivae are normal. Pupils are equal, round, and reactive to light.  Neck: Neck supple.  Cardiovascular: Normal rate, regular rhythm, normal heart sounds and intact distal pulses.  Exam reveals no gallop and no friction rub.   No murmur heard. Pulmonary/Chest: Effort normal. No respiratory distress. He has wheezes. He has no rales. He exhibits no tenderness.       Diffuse exp wheeze Poor air movement Min conversational dyspnea  Abdominal: Soft. Bowel sounds are  normal. There is no tenderness. There is no rebound and no guarding.  Musculoskeletal: Normal range of motion. He exhibits no edema and no tenderness.  Neurological: He is alert and oriented to person, place, and time.  Skin: Skin is warm and dry.  Psychiatric: He has a normal mood and affect.    ED Course  Procedures (including critical care time)  Labs Reviewed - No data to display Dg Chest Portable 1 View  01/13/2012  *RADIOLOGY REPORT*  Clinical Data: Cough, history of asthma  PORTABLE CHEST - 1 VIEW  Comparison: 09/12/2011; 03/01/2011; 02/24/2011; 03/03/2008  Findings:  Grossly unchanged cardiac silhouette and mediastinal contours with mild prominence of the aortic arch.  There is mild diffuse increased conspicuity of the pulmonary interstitium, possibly accentuated due to technique.  No focal airspace opacities.  There is mild eventration of the medial aspect of right hemidiaphragm. No pleural effusion or pneumothorax.  Unchanged bones.  IMPRESSION: Mild diffuse increased conspicuity the pulmonary interstitium, nonspecific but may be seen in the setting of airways disease, though atypical infection may have a similar appearance.  Original Report Authenticated By: Waynard Reeds, M.D.    1. Asthma exacerbation     MDM   Asthma exacerbation. Improved after duoneb, cont albuterol neb, solumedrol. No wheezing and good air movement. Ambulatory in ED without SOB oxygen 99%. Would like to go home. Given prescription for albuterol inh (given one in ED as well), prednisone, albuterol tab. He will f/u with PMD for asx HTN- states has been compliant with his medication and will f/u for recheck of his blood pressure.   BP 174/98  Pulse 77  Temp 98.3 F (36.8 C)  Resp 21  SpO2 99%      Forbes Cellar, MD 01/13/12 1022

## 2012-03-23 ENCOUNTER — Emergency Department (HOSPITAL_BASED_OUTPATIENT_CLINIC_OR_DEPARTMENT_OTHER)
Admission: EM | Admit: 2012-03-23 | Discharge: 2012-03-23 | Disposition: A | Discharge status: Home / Self Care | Payer: Medicaid Other | Attending: Emergency Medicine | Admitting: Emergency Medicine

## 2012-03-23 ENCOUNTER — Encounter (HOSPITAL_BASED_OUTPATIENT_CLINIC_OR_DEPARTMENT_OTHER): Payer: Self-pay | Admitting: Emergency Medicine

## 2012-03-23 DIAGNOSIS — Z91013 Allergy to seafood: Secondary | ICD-10-CM | POA: Insufficient documentation

## 2012-03-23 DIAGNOSIS — J45909 Unspecified asthma, uncomplicated: Secondary | ICD-10-CM

## 2012-03-23 DIAGNOSIS — I10 Essential (primary) hypertension: Secondary | ICD-10-CM | POA: Insufficient documentation

## 2012-03-23 MED ORDER — ALBUTEROL SULFATE (5 MG/ML) 0.5% IN NEBU
INHALATION_SOLUTION | RESPIRATORY_TRACT | Status: AC
  Administered 2012-03-23: 5 mg
  Filled 2012-03-23: qty 1

## 2012-03-23 MED ORDER — IPRATROPIUM BROMIDE 0.02 % IN SOLN: 0.5000 mg | Freq: Once | RESPIRATORY_TRACT | Status: DC

## 2012-03-23 MED ORDER — ALBUTEROL SULFATE HFA 108 (90 BASE) MCG/ACT IN AERS
INHALATION_SPRAY | RESPIRATORY_TRACT | Status: AC
  Filled 2012-03-23: qty 6.7

## 2012-03-23 MED ORDER — ALBUTEROL (5 MG/ML) CONTINUOUS INHALATION SOLN
10.0000 mg/h | INHALATION_SOLUTION | Freq: Once | RESPIRATORY_TRACT | Status: DC
  Filled 2012-03-23: qty 20

## 2012-03-23 MED ORDER — PREDNISONE 50 MG PO TABS
60.0000 mg | ORAL_TABLET | Freq: Once | ORAL | Status: AC
  Administered 2012-03-23: 60 mg via ORAL
  Filled 2012-03-23: qty 1

## 2012-03-23 MED ORDER — ALBUTEROL (5 MG/ML) CONTINUOUS INHALATION SOLN
10.0000 mg/h | INHALATION_SOLUTION | RESPIRATORY_TRACT | Status: AC
  Administered 2012-03-23: 10 mg/h via RESPIRATORY_TRACT

## 2012-03-23 MED ORDER — ALBUTEROL SULFATE (5 MG/ML) 0.5% IN NEBU
INHALATION_SOLUTION | RESPIRATORY_TRACT | Status: AC
  Filled 2012-03-23: qty 0.5

## 2012-03-23 MED ORDER — PREDNISONE 10 MG PO TABS: 20.0000 mg | ORAL_TABLET | Freq: Every day | ORAL | Status: DC

## 2012-03-23 MED ORDER — IPRATROPIUM BROMIDE 0.02 % IN SOLN
RESPIRATORY_TRACT | Status: AC
  Filled 2012-03-23: qty 2.5

## 2012-03-23 MED ORDER — ALBUTEROL SULFATE HFA 108 (90 BASE) MCG/ACT IN AERS
2.0000 | INHALATION_SPRAY | RESPIRATORY_TRACT | Status: DC | PRN
  Administered 2012-03-23: 11:00:00 via RESPIRATORY_TRACT

## 2012-03-23 MED ORDER — IPRATROPIUM BROMIDE 0.02 % IN SOLN
0.5000 mg | Freq: Once | RESPIRATORY_TRACT | Status: AC
  Administered 2012-03-23: 0.5 mg via RESPIRATORY_TRACT

## 2012-03-23 NOTE — ED Notes (Signed)
1 hour continuous neb complete,  RR 20, HR 83, SpO2 95% on room air, PEFR 325= 43% predicted, FEV1 2.2sec=65% predicted.  BBS with increased aeration, insp/exp still noted with forced exhalation.

## 2012-03-23 NOTE — ED Notes (Signed)
After HHN, BBS exp. Wheezes, RR 18, HR 84, SpO2 98% on room air PEFR 335l/m= 45% predicted, FEV1 2.2 sec. = 64.9% predicted.

## 2012-03-23 NOTE — ED Provider Notes (Signed)
History     CSN: [id number]  Arrival date & time 03/23/12  4098   First MD Initiated Contact with Patient 03/23/12 0825      No chief complaint on file.   (Consider location/radiation/quality/duration/timing/severity/associated sxs/prior treatment) HPI Patient with history of asthma and now out of meds and worse with weather changing.  Patient using albuterol multiple times per day but decreasing dose to conserve meds. Out of bp meds and other asthma meds.  He has had some productive cough but no fever or chill.  He is dyspneic but has not chest pain.  Past Medical History  Diagnosis Date  . Asthma   . Hypertension   . High cholesterol     Past Surgical History  Procedure Date  . Abdominal surgery     No family history on file.  History  Substance Use Topics  . Smoking status: Never Smoker   . Smokeless tobacco: Not on file  . Alcohol Use: 1.2 oz/week    2 Cans of beer per week      Review of Systems  Constitutional: Negative for fever and chills.  HENT: Negative for neck stiffness.   Eyes: Negative for visual disturbance.  Respiratory: Positive for shortness of breath and wheezing.   Cardiovascular: Negative for chest pain.  Gastrointestinal: Negative for vomiting, diarrhea and blood in stool.  Genitourinary: Negative for dysuria, frequency and decreased urine volume.  Musculoskeletal: Negative for myalgias and joint swelling.  Skin: Negative for rash.  Neurological: Negative for weakness.  Hematological: Negative for adenopathy.  Psychiatric/Behavioral: Negative for agitation.    Allergies  Shellfish allergy  Home Medications   Current Outpatient Rx  Name Route Sig Dispense Refill  . ALBUTEROL SULFATE HFA 108 (90 BASE) MCG/ACT IN AERS Inhalation Inhale 1-2 puffs into the lungs every 6 (six) hours as needed for wheezing. 1 Inhaler 5  . ALBUTEROL SULFATE (2.5 MG/3ML) 0.083% IN NEBU Nebulization Take 2.5 mg by nebulization every 2 (two) hours as needed.  For wheezing      . ALBUTEROL SULFATE 2 MG PO TABS Oral Take 1 tablet (2 mg total) by mouth 3 (three) times daily. 30 tablet 0  . ALBUTEROL 90 MCG/ACT IN AERS Inhalation Inhale 4 puffs into the lungs 3 (three) times daily as needed. For shortness of breath     . AMLODIPINE BESYLATE 10 MG PO TABS Oral Take 10 mg by mouth daily.      . COLESEVELAM HCL 625 MG PO TABS Oral Take 1,250 mg by mouth every other day.      Marland Kitchen LISINOPRIL 10 MG PO TABS Oral Take 10 mg by mouth daily.      Marland Kitchen LISINOPRIL 20 MG PO TABS Oral Take 20 mg by mouth daily.      Marland Kitchen MONTELUKAST SODIUM 10 MG PO TABS Oral Take 10 mg by mouth daily.     Marland Kitchen MONTELUKAST SODIUM 10 MG PO TABS Oral Take 1 tablet (10 mg total) by mouth at bedtime. 30 tablet 1  . PREDNISONE 20 MG PO TABS Oral Take 3 tablets (60 mg total) by mouth daily. 15 tablet 0  . ROSUVASTATIN CALCIUM 20 MG PO TABS Oral Take 20 mg by mouth daily.        BP 155/95  Pulse 82  Temp 98.3 F (36.8 C) (Oral)  Resp 22  Ht 6\' 3"  (1.905 m)  Wt 258 lb (117.028 kg)  BMI 32.25 kg/m2  SpO2 92%  Physical Exam  Nursing note and vitals reviewed. Constitutional:  He is oriented to person, place, and time. He appears well-developed and well-nourished.  HENT:  Head: Normocephalic and atraumatic.  Right Ear: External ear normal.  Left Ear: External ear normal.  Nose: Nose normal.  Mouth/Throat: Oropharynx is clear and moist.  Eyes: Conjunctivae normal and EOM are normal. Pupils are equal, round, and reactive to light.  Neck: Normal range of motion. Neck supple.  Cardiovascular: Normal rate, regular rhythm, normal heart sounds and intact distal pulses.   Pulmonary/Chest: No respiratory distress. He has wheezes. He exhibits no tenderness.  Abdominal: Soft. Bowel sounds are normal. He exhibits no distension and no mass. There is no tenderness. There is no guarding.  Musculoskeletal: Normal range of motion.  Neurological: He is alert and oriented to person, place, and time. He has  normal reflexes. He exhibits normal muscle tone. Coordination normal.  Skin: Skin is warm and dry.  Psychiatric: He has a normal mood and affect. His behavior is normal. Judgment and thought content normal.    ED Course  Procedures (including critical care time)  Labs Reviewed - No data to display No results found.   No diagnosis found. 9:33 AM Patient at end of hour long neb with increased bs but continues with expiratory wheezes.  Sats normal on air.    MDM  10:53 AM Patient with improvement subjectively and good air movement with few wheezes.  Peak flow in yellow.  Patient given hfa here and instructed.   Patient ambulated in ED with O2 saturations maintained >90, no current signs of respiratory distress. Lung exam improved after hospital treatment. Pt states they are breathing at baseline. Pt has been instructed to continue using prescribed medications and to speak with PCP about today's exacerbation.  RX for prednisone.        Hilario Quarry, MD 03/23/12 1054

## 2012-03-23 NOTE — ED Notes (Signed)
Patient continues on continuous neb.

## 2012-03-23 NOTE — ED Notes (Signed)
Pt c/o asthma exacerbation, sx started 3 d ago.  Pt out of most meds, but is getting established w/ a new MD next week (just got approved for disability)

## 2012-05-04 ENCOUNTER — Emergency Department (HOSPITAL_BASED_OUTPATIENT_CLINIC_OR_DEPARTMENT_OTHER)
Admission: EM | Admit: 2012-05-04 | Discharge: 2012-05-04 | Disposition: A | Discharge status: Home / Self Care | Payer: Medicaid Other | Attending: Emergency Medicine | Admitting: Emergency Medicine

## 2012-05-04 ENCOUNTER — Encounter (HOSPITAL_BASED_OUTPATIENT_CLINIC_OR_DEPARTMENT_OTHER): Payer: Self-pay | Admitting: *Deleted

## 2012-05-04 DIAGNOSIS — Z79899 Other long term (current) drug therapy: Secondary | ICD-10-CM | POA: Insufficient documentation

## 2012-05-04 DIAGNOSIS — J45909 Unspecified asthma, uncomplicated: Secondary | ICD-10-CM

## 2012-05-04 DIAGNOSIS — I10 Essential (primary) hypertension: Secondary | ICD-10-CM | POA: Insufficient documentation

## 2012-05-04 DIAGNOSIS — F3289 Other specified depressive episodes: Secondary | ICD-10-CM | POA: Insufficient documentation

## 2012-05-04 DIAGNOSIS — Z792 Long term (current) use of antibiotics: Secondary | ICD-10-CM | POA: Insufficient documentation

## 2012-05-04 DIAGNOSIS — F329 Major depressive disorder, single episode, unspecified: Secondary | ICD-10-CM | POA: Insufficient documentation

## 2012-05-04 DIAGNOSIS — J45901 Unspecified asthma with (acute) exacerbation: Secondary | ICD-10-CM | POA: Insufficient documentation

## 2012-05-04 DIAGNOSIS — E7800 Pure hypercholesterolemia, unspecified: Secondary | ICD-10-CM | POA: Insufficient documentation

## 2012-05-04 HISTORY — DX: Depression, unspecified: F32.A

## 2012-05-04 HISTORY — DX: Major depressive disorder, single episode, unspecified: F32.9

## 2012-05-04 MED ORDER — IPRATROPIUM BROMIDE 0.02 % IN SOLN
0.5000 mg | Freq: Once | RESPIRATORY_TRACT | Status: AC
  Administered 2012-05-04: 0.5 mg via RESPIRATORY_TRACT

## 2012-05-04 MED ORDER — IPRATROPIUM BROMIDE 0.02 % IN SOLN
RESPIRATORY_TRACT | Status: AC
  Administered 2012-05-04: 0.5 mg via RESPIRATORY_TRACT
  Filled 2012-05-04: qty 2.5

## 2012-05-04 MED ORDER — ALBUTEROL SULFATE HFA 108 (90 BASE) MCG/ACT IN AERS
2.0000 | INHALATION_SPRAY | RESPIRATORY_TRACT | Status: DC | PRN
  Administered 2012-05-04 (×2): 2 via RESPIRATORY_TRACT
  Filled 2012-05-04: qty 6.7

## 2012-05-04 MED ORDER — ALBUTEROL SULFATE (5 MG/ML) 0.5% IN NEBU
5.0000 mg | INHALATION_SOLUTION | Freq: Once | RESPIRATORY_TRACT | Status: AC
  Administered 2012-05-04: 5 mg via RESPIRATORY_TRACT

## 2012-05-04 MED ORDER — ALBUTEROL SULFATE (5 MG/ML) 0.5% IN NEBU
INHALATION_SOLUTION | RESPIRATORY_TRACT | Status: AC
  Administered 2012-05-04: 5 mg via RESPIRATORY_TRACT
  Filled 2012-05-04: qty 1

## 2012-05-04 MED ORDER — PREDNISONE 10 MG PO TABS: 20.0000 mg | ORAL_TABLET | Freq: Two times a day (BID) | ORAL | Status: DC

## 2012-05-04 NOTE — ED Notes (Signed)
Patient states he has had wheezes for one week.  Is out of his albuterol and has been using his symbocort up to 8 times per day.

## 2012-05-04 NOTE — ED Provider Notes (Signed)
History     CSN: [id number]  Arrival date & time 05/04/12  0825   First MD Initiated Contact with Patient 05/04/12 [phone]      Chief Complaint  Patient presents with  . Asthma    (Consider location/radiation/quality/duration/timing/severity/associated sxs/prior treatment) HPI Comments: Patient with history of asthma.  Presents complaining of a several day history of wheezing, shortness of breath.  He is out of his albuterol and has been using symbicort several times per day.  This has not helped much.  He denies fevers or chills.  No productive cough.    Patient is a 46 y.o. male presenting with asthma. The history is provided by the patient.  Asthma This is a recurrent problem. Episode onset: several days ago. The problem occurs constantly. The problem has been gradually worsening. Associated symptoms include shortness of breath. Pertinent negatives include no chest pain. Nothing aggravates the symptoms. Nothing relieves the symptoms. Treatments tried: mdi. The treatment provided no relief.    Past Medical History  Diagnosis Date  . Asthma   . Hypertension   . High cholesterol   . Depression     Past Surgical History  Procedure Date  . Abdominal surgery   . Appendectomy     No family history on file.  History  Substance Use Topics  . Smoking status: Never Smoker   . Smokeless tobacco: Not on file  . Alcohol Use: 1.2 oz/week    2 Cans of beer per week      Review of Systems  Respiratory: Positive for shortness of breath.   Cardiovascular: Negative for chest pain.  All other systems reviewed and are negative.    Allergies  Shellfish allergy  Home Medications   Current Outpatient Rx  Name Route Sig Dispense Refill  . ALBUTEROL SULFATE HFA 108 (90 BASE) MCG/ACT IN AERS Inhalation Inhale 1-2 puffs into the lungs every 6 (six) hours as needed for wheezing. 1 Inhaler 5  . ALBUTEROL SULFATE (2.5 MG/3ML) 0.083% IN NEBU Nebulization Take 2.5 mg by nebulization  every 2 (two) hours as needed. For wheezing      . ALBUTEROL SULFATE 2 MG PO TABS Oral Take 1 tablet (2 mg total) by mouth 3 (three) times daily. 30 tablet 0  . ALBUTEROL 90 MCG/ACT IN AERS Inhalation Inhale 4 puffs into the lungs 3 (three) times daily as needed. For shortness of breath     . AMLODIPINE BESYLATE 10 MG PO TABS Oral Take 10 mg by mouth daily.      . COLESEVELAM HCL 625 MG PO TABS Oral Take 1,250 mg by mouth every other day.      Marland Kitchen LISINOPRIL 10 MG PO TABS Oral Take 10 mg by mouth daily.      Marland Kitchen LISINOPRIL 20 MG PO TABS Oral Take 20 mg by mouth daily.      Marland Kitchen MONTELUKAST SODIUM 10 MG PO TABS Oral Take 10 mg by mouth daily.     Marland Kitchen MONTELUKAST SODIUM 10 MG PO TABS Oral Take 1 tablet (10 mg total) by mouth at bedtime. 30 tablet 1  . PREDNISONE 10 MG PO TABS Oral Take 2 tablets (20 mg total) by mouth daily. 15 tablet 0  . PREDNISONE 20 MG PO TABS Oral Take 3 tablets (60 mg total) by mouth daily. 15 tablet 0  . ROSUVASTATIN CALCIUM 20 MG PO TABS Oral Take 20 mg by mouth daily.        BP 154/89  Pulse 79  Temp 98.4 F (  36.9 C) (Oral)  Resp 20  Ht 6\' 3"  (1.905 m)  Wt 255 lb (115.667 kg)  BMI 31.87 kg/m2  SpO2 96%  Physical Exam  Vitals reviewed. Constitutional: He is oriented to person, place, and time. He appears well-developed and well-nourished. No distress.  HENT:  Head: Normocephalic and atraumatic.  Mouth/Throat: Oropharynx is clear and moist.  Neck: Normal range of motion. Neck supple.  Cardiovascular: Normal rate and regular rhythm.   No murmur heard. Pulmonary/Chest: Effort normal.       There are bilateral expiratory wheezes present.    Abdominal: Soft. Bowel sounds are normal. He exhibits no distension.  Musculoskeletal: Normal range of motion. He exhibits no edema.  Neurological: He is alert and oriented to person, place, and time.  Skin: Skin is warm and dry. He is not diaphoretic.    ED Course  Procedures (including critical care time)  Labs Reviewed -  No data to display No results found.   No diagnosis found.    MDM  Will treat with prednisone, albuterol.          Geoffery Lyons, MD 05/04/12 0900

## 2012-06-07 ENCOUNTER — Emergency Department (HOSPITAL_BASED_OUTPATIENT_CLINIC_OR_DEPARTMENT_OTHER)
Admission: EM | Admit: 2012-06-07 | Discharge: 2012-06-07 | Disposition: A | Discharge status: Home / Self Care | Payer: Medicaid Other | Attending: Emergency Medicine | Admitting: Emergency Medicine

## 2012-06-07 ENCOUNTER — Encounter (HOSPITAL_BASED_OUTPATIENT_CLINIC_OR_DEPARTMENT_OTHER): Payer: Self-pay | Admitting: *Deleted

## 2012-06-07 DIAGNOSIS — R059 Cough, unspecified: Secondary | ICD-10-CM | POA: Insufficient documentation

## 2012-06-07 DIAGNOSIS — E7800 Pure hypercholesterolemia, unspecified: Secondary | ICD-10-CM | POA: Insufficient documentation

## 2012-06-07 DIAGNOSIS — Z79899 Other long term (current) drug therapy: Secondary | ICD-10-CM | POA: Insufficient documentation

## 2012-06-07 DIAGNOSIS — I10 Essential (primary) hypertension: Secondary | ICD-10-CM | POA: Insufficient documentation

## 2012-06-07 DIAGNOSIS — J45901 Unspecified asthma with (acute) exacerbation: Secondary | ICD-10-CM | POA: Insufficient documentation

## 2012-06-07 DIAGNOSIS — R05 Cough: Secondary | ICD-10-CM | POA: Insufficient documentation

## 2012-06-07 DIAGNOSIS — Z8659 Personal history of other mental and behavioral disorders: Secondary | ICD-10-CM | POA: Insufficient documentation

## 2012-06-07 MED ORDER — PREDNISONE 10 MG PO TABS
60.0000 mg | ORAL_TABLET | Freq: Once | ORAL | Status: AC
  Administered 2012-06-07: 60 mg via ORAL
  Filled 2012-06-07: qty 1

## 2012-06-07 MED ORDER — ALBUTEROL SULFATE (5 MG/ML) 0.5% IN NEBU
5.0000 mg | INHALATION_SOLUTION | Freq: Once | RESPIRATORY_TRACT | Status: AC
  Administered 2012-06-07: 5 mg via RESPIRATORY_TRACT

## 2012-06-07 MED ORDER — IPRATROPIUM BROMIDE 0.02 % IN SOLN
0.5000 mg | Freq: Once | RESPIRATORY_TRACT | Status: AC
  Administered 2012-06-07: 0.5 mg via RESPIRATORY_TRACT

## 2012-06-07 MED ORDER — IPRATROPIUM BROMIDE 0.02 % IN SOLN
RESPIRATORY_TRACT | Status: AC
  Administered 2012-06-07: 0.5 mg via RESPIRATORY_TRACT
  Filled 2012-06-07: qty 2.5

## 2012-06-07 MED ORDER — ALBUTEROL SULFATE (5 MG/ML) 0.5% IN NEBU
INHALATION_SOLUTION | RESPIRATORY_TRACT | Status: AC
  Administered 2012-06-07: 5 mg via RESPIRATORY_TRACT
  Filled 2012-06-07: qty 1

## 2012-06-07 MED ORDER — PREDNISONE 10 MG PO TABS: 40.0000 mg | ORAL_TABLET | Freq: Every day | ORAL | Status: DC

## 2012-06-07 NOTE — ED Provider Notes (Signed)
History     CSN: [id number]  Arrival date & time 06/07/12  0730   First MD Initiated Contact with Patient 06/07/12 [phone]      Chief Complaint  Patient presents with  . Shortness of Breath    (Consider location/radiation/quality/duration/timing/severity/associated sxs/prior treatment) HPI Comments: Patient presents with a two-day history of worsening wheezing and shortness of breath. Felt little bit of runny nose but denies any chest congestion. Denies any productive cough. Denies any fevers or chills. He states it feels like his typical asthma type symptoms. He currently uses Symbicort and albuterol MDI at home as well as nebulizers treatments. He states he uses albuterol nebulizer this morning but his symptoms continued to worsen   Past Medical History  Diagnosis Date  . Asthma   . Hypertension   . High cholesterol   . Depression     Past Surgical History  Procedure Date  . Abdominal surgery   . Appendectomy     No family history on file.  History  Substance Use Topics  . Smoking status: Never Smoker   . Smokeless tobacco: Not on file  . Alcohol Use: 1.2 oz/week    2 Cans of beer per week      Review of Systems  Constitutional: Negative for fever, chills, diaphoresis and fatigue.  HENT: Negative for congestion, rhinorrhea and sneezing.   Eyes: Negative.   Respiratory: Positive for cough and shortness of breath. Negative for chest tightness.   Cardiovascular: Negative for chest pain and leg swelling.  Gastrointestinal: Negative for nausea, vomiting, abdominal pain, diarrhea and blood in stool.  Genitourinary: Negative for frequency, hematuria, flank pain and difficulty urinating.  Musculoskeletal: Negative for back pain and arthralgias.  Skin: Negative for rash.  Neurological: Negative for dizziness, speech difficulty, weakness, numbness and headaches.    Allergies  Shellfish allergy  Home Medications   Current Outpatient Rx  Name  Route  Sig  Dispense   Refill  . ALBUTEROL SULFATE HFA 108 (90 BASE) MCG/ACT IN AERS   Inhalation   Inhale 1-2 puffs into the lungs every 6 (six) hours as needed for wheezing.   1 Inhaler   5   . ALBUTEROL SULFATE (2.5 MG/3ML) 0.083% IN NEBU   Nebulization   Take 2.5 mg by nebulization every 2 (two) hours as needed. For wheezing           . ALBUTEROL SULFATE 2 MG PO TABS   Oral   Take 1 tablet (2 mg total) by mouth 3 (three) times daily.   30 tablet   0   . ALBUTEROL 90 MCG/ACT IN AERS   Inhalation   Inhale 4 puffs into the lungs 3 (three) times daily as needed. For shortness of breath          . AMLODIPINE BESYLATE 10 MG PO TABS   Oral   Take 10 mg by mouth daily.           . COLESEVELAM HCL 625 MG PO TABS   Oral   Take 1,250 mg by mouth every other day.           Marland Kitchen LISINOPRIL 10 MG PO TABS   Oral   Take 10 mg by mouth daily.           Marland Kitchen LISINOPRIL 20 MG PO TABS   Oral   Take 20 mg by mouth daily.           Marland Kitchen MONTELUKAST SODIUM 10 MG PO TABS   Oral  Take 10 mg by mouth daily.          Marland Kitchen MONTELUKAST SODIUM 10 MG PO TABS   Oral   Take 1 tablet (10 mg total) by mouth at bedtime.   30 tablet   1   . PREDNISONE 10 MG PO TABS   Oral   Take 2 tablets (20 mg total) by mouth daily.   15 tablet   0   . PREDNISONE 10 MG PO TABS   Oral   Take 2 tablets (20 mg total) by mouth 2 (two) times daily.   20 tablet   0   . PREDNISONE 10 MG PO TABS   Oral   Take 4 tablets (40 mg total) by mouth daily.   20 tablet   0   . PREDNISONE 20 MG PO TABS   Oral   Take 3 tablets (60 mg total) by mouth daily.   15 tablet   0   . ROSUVASTATIN CALCIUM 20 MG PO TABS   Oral   Take 20 mg by mouth daily.             BP 161/96  Pulse 84  Temp 98.2 F (36.8 C) (Oral)  Resp 21  SpO2 100%  Physical Exam  Constitutional: He is oriented to person, place, and time. He appears well-developed and well-nourished.       Talking in short sentences.  Some mild increased work of  breathing  HENT:  Head: Normocephalic and atraumatic.  Eyes: Pupils are equal, round, and reactive to light.  Neck: Normal range of motion. Neck supple.  Cardiovascular: Normal rate, regular rhythm and normal heart sounds.   Pulmonary/Chest: Effort normal. No respiratory distress. He has wheezes (bilateral diffuse wheezes/decreased breath sounds). He has no rales. He exhibits no tenderness.  Abdominal: Soft. Bowel sounds are normal. There is no tenderness. There is no rebound and no guarding.  Musculoskeletal: Normal range of motion. He exhibits no edema.  Lymphadenopathy:    He has no cervical adenopathy.  Neurological: He is alert and oriented to person, place, and time.  Skin: Skin is warm and dry. No rash noted.  Psychiatric: He has a normal mood and affect.    ED Course  Procedures (including critical care time)  Labs Reviewed - No data to display No results found.   1. Asthma exacerbation       MDM  Patient was given nebulizer treatments and prednisone. He states she's doing much better after this and feels been expected normal. His oxygen saturation is normal. He has no tachypnea. He is talking in full sentences. I will give him a prescription for prednisone 40 mg once a day for 5 days. I did advise and it's important followup with his primary care physician however because it seems that his asthma is suboptimally controlled. He has no symptoms suggestive of pneumonia.        Rolan Bucco, MD 06/07/12 [phone]

## 2012-06-07 NOTE — ED Notes (Signed)
Patient c/o sob over the past two weeks, today he is having trouble catching his breath, hx of asthma, used inhaler this morning

## 2012-07-20 ENCOUNTER — Encounter (HOSPITAL_BASED_OUTPATIENT_CLINIC_OR_DEPARTMENT_OTHER): Payer: Self-pay | Admitting: Emergency Medicine

## 2012-07-20 ENCOUNTER — Emergency Department (HOSPITAL_BASED_OUTPATIENT_CLINIC_OR_DEPARTMENT_OTHER)
Admission: EM | Admit: 2012-07-20 | Discharge: 2012-07-20 | Disposition: A | Discharge status: Home / Self Care | Payer: Medicaid Other | Attending: Emergency Medicine | Admitting: Emergency Medicine

## 2012-07-20 DIAGNOSIS — E7800 Pure hypercholesterolemia, unspecified: Secondary | ICD-10-CM | POA: Insufficient documentation

## 2012-07-20 DIAGNOSIS — Z8659 Personal history of other mental and behavioral disorders: Secondary | ICD-10-CM | POA: Insufficient documentation

## 2012-07-20 DIAGNOSIS — J45901 Unspecified asthma with (acute) exacerbation: Secondary | ICD-10-CM | POA: Insufficient documentation

## 2012-07-20 DIAGNOSIS — I10 Essential (primary) hypertension: Secondary | ICD-10-CM | POA: Insufficient documentation

## 2012-07-20 DIAGNOSIS — Z79899 Other long term (current) drug therapy: Secondary | ICD-10-CM | POA: Insufficient documentation

## 2012-07-20 MED ORDER — PREDNISONE 50 MG PO TABS
60.0000 mg | ORAL_TABLET | Freq: Once | ORAL | Status: AC
  Administered 2012-07-20: 60 mg via ORAL
  Filled 2012-07-20: qty 1

## 2012-07-20 MED ORDER — PREDNISONE 50 MG PO TABS: 50.0000 mg | ORAL_TABLET | Freq: Every day | ORAL | Status: DC

## 2012-07-20 MED ORDER — ALBUTEROL SULFATE (5 MG/ML) 0.5% IN NEBU
5.0000 mg | INHALATION_SOLUTION | Freq: Once | RESPIRATORY_TRACT | Status: AC
  Administered 2012-07-20: 5 mg via RESPIRATORY_TRACT
  Filled 2012-07-20: qty 1

## 2012-07-20 MED ORDER — IPRATROPIUM BROMIDE 0.02 % IN SOLN
0.5000 mg | Freq: Once | RESPIRATORY_TRACT | Status: AC
  Administered 2012-07-20: 0.5 mg via RESPIRATORY_TRACT
  Filled 2012-07-20: qty 2.5

## 2012-07-20 MED ORDER — ALBUTEROL SULFATE HFA 108 (90 BASE) MCG/ACT IN AERS
2.0000 | INHALATION_SPRAY | RESPIRATORY_TRACT | Status: DC | PRN
  Administered 2012-07-20: 2 via RESPIRATORY_TRACT
  Filled 2012-07-20: qty 6.7

## 2012-07-20 MED ORDER — ALBUTEROL (5 MG/ML) CONTINUOUS INHALATION SOLN
15.0000 mg/h | INHALATION_SOLUTION | RESPIRATORY_TRACT | Status: DC
  Administered 2012-07-20: 15 mg/h via RESPIRATORY_TRACT
  Filled 2012-07-20: qty 20
  Filled 2012-07-20: qty 0.5

## 2012-07-20 NOTE — ED Provider Notes (Signed)
History     CSN: [id number]  Arrival date & time 07/20/12  4098   First MD Initiated Contact with Patient 07/20/12 [phone]      Chief Complaint  Patient presents with  . Shortness of Breath    (Consider location/radiation/quality/duration/timing/severity/associated sxs/prior treatment) HPI Pt presents with cough and wheezing.  He states he has felt this way for approx 1 week.  Cough is nonproductive.  He states he has been using his albuterol but has run out- states it did help temporarily.  No chest pain, no weakness or leg swelling.  States he needs steroids approx once monthly due to exacerbations.  Has been on advair in the past but states it did not help much.  No fever/chills. No vomiting.  Denies hx of intubation. There are no other associated systemic symptoms, there are no other alleviating or modifying factors.   Past Medical History  Diagnosis Date  . Asthma   . Hypertension   . High cholesterol   . Depression     Past Surgical History  Procedure Date  . Abdominal surgery   . Appendectomy     History reviewed. No pertinent family history.  History  Substance Use Topics  . Smoking status: Never Smoker   . Smokeless tobacco: Not on file  . Alcohol Use: 1.2 oz/week    2 Cans of beer per week      Review of Systems ROS reviewed and all otherwise negative except for mentioned in HPI  Allergies  Shellfish allergy  Home Medications   Current Outpatient Rx  Name  Route  Sig  Dispense  Refill  . ALBUTEROL SULFATE HFA 108 (90 BASE) MCG/ACT IN AERS   Inhalation   Inhale 1-2 puffs into the lungs every 6 (six) hours as needed for wheezing.   1 Inhaler   5   . ALBUTEROL SULFATE (2.5 MG/3ML) 0.083% IN NEBU   Nebulization   Take 2.5 mg by nebulization every 2 (two) hours as needed. For wheezing           . ALBUTEROL 90 MCG/ACT IN AERS   Inhalation   Inhale 4 puffs into the lungs 3 (three) times daily as needed. For shortness of breath          .  AMLODIPINE BESYLATE 10 MG PO TABS   Oral   Take 10 mg by mouth daily.           . COLESEVELAM HCL 625 MG PO TABS   Oral   Take 1,250 mg by mouth every other day.           Marland Kitchen LISINOPRIL 20 MG PO TABS   Oral   Take 20 mg by mouth daily.           Marland Kitchen PREDNISONE 50 MG PO TABS   Oral   Take 1 tablet (50 mg total) by mouth daily.   4 tablet   0     BP 151/100  Pulse 100  Temp 98.6 F (37 C) (Oral)  Resp 24  Ht 6\' 3"  (1.905 m)  Wt 259 lb (117.482 kg)  BMI 32.37 kg/m2  SpO2 98% Vitals reviewed Physical Exam Physical Examination: General appearance - alert, well appearing, and in no distress Mental status - alert, oriented to person, place, and time Eyes - no conjunctival injection, no scleral icterus Mouth - mucous membranes moist, pharynx normal without lesions Chest - symmetric air entry, bilateral expiratory wheezing with moderate air movement, no increased respiratory effort Heart -  normal rate, regular rhythm, normal S1, S2, no murmurs, rubs, clicks or gallops Abdomen - soft, nontender, nondistended, no masses or organomegaly Extremities - peripheral pulses normal, no pedal edema, no clubbing or cyanosis Skin - normal coloration and turgor, no rashes  ED Course  Procedures (including critical care time)  Labs Reviewed - No data to display No results found.   1. Asthma exacerbation       MDM  Pt presenting with c/o wheezing and congestion that feels similar to his prior asthma exacerbations.  He has received prednisone in the ED as well as albuterol and atrovent.  States he feels much improved after nebs- his wheezing has diminished.  Pt overall nontoxic and well hydrated in appearance.  Discharged with strict return precautions.  Pt agreeable with plan.        Ethelda Chick, MD 07/20/12 1029

## 2012-07-20 NOTE — ED Notes (Addendum)
Pt having sob x one week.  Dry cough, no fever.  Noted audible wheezing.  Pt has not been on some of his previous medications.

## 2012-10-04 ENCOUNTER — Emergency Department (HOSPITAL_BASED_OUTPATIENT_CLINIC_OR_DEPARTMENT_OTHER)
Admission: EM | Admit: 2012-10-04 | Discharge: 2012-10-04 | Disposition: A | Discharge status: Home / Self Care | Payer: Medicaid Other | Attending: Emergency Medicine | Admitting: Emergency Medicine

## 2012-10-04 ENCOUNTER — Encounter (HOSPITAL_BASED_OUTPATIENT_CLINIC_OR_DEPARTMENT_OTHER): Payer: Self-pay

## 2012-10-04 ENCOUNTER — Emergency Department (HOSPITAL_BASED_OUTPATIENT_CLINIC_OR_DEPARTMENT_OTHER): Payer: Medicaid Other

## 2012-10-04 DIAGNOSIS — R0602 Shortness of breath: Secondary | ICD-10-CM | POA: Insufficient documentation

## 2012-10-04 DIAGNOSIS — R05 Cough: Secondary | ICD-10-CM | POA: Insufficient documentation

## 2012-10-04 DIAGNOSIS — Z8659 Personal history of other mental and behavioral disorders: Secondary | ICD-10-CM | POA: Insufficient documentation

## 2012-10-04 DIAGNOSIS — E7800 Pure hypercholesterolemia, unspecified: Secondary | ICD-10-CM | POA: Insufficient documentation

## 2012-10-04 DIAGNOSIS — R109 Unspecified abdominal pain: Secondary | ICD-10-CM | POA: Insufficient documentation

## 2012-10-04 DIAGNOSIS — J45901 Unspecified asthma with (acute) exacerbation: Secondary | ICD-10-CM | POA: Insufficient documentation

## 2012-10-04 DIAGNOSIS — Z79899 Other long term (current) drug therapy: Secondary | ICD-10-CM | POA: Insufficient documentation

## 2012-10-04 DIAGNOSIS — R059 Cough, unspecified: Secondary | ICD-10-CM | POA: Insufficient documentation

## 2012-10-04 DIAGNOSIS — R51 Headache: Secondary | ICD-10-CM | POA: Insufficient documentation

## 2012-10-04 DIAGNOSIS — J45909 Unspecified asthma, uncomplicated: Secondary | ICD-10-CM

## 2012-10-04 DIAGNOSIS — IMO0002 Reserved for concepts with insufficient information to code with codable children: Secondary | ICD-10-CM | POA: Insufficient documentation

## 2012-10-04 DIAGNOSIS — I10 Essential (primary) hypertension: Secondary | ICD-10-CM | POA: Insufficient documentation

## 2012-10-04 IMAGING — CR DG CHEST 2V
2 series · 2 of 2 positions shown · non-contrast
Comparison: [DATE] and [DATE].

CLINICAL DATA: Shortness of breath and wheezing.

CHEST - 2 VIEW

[w chest pa]
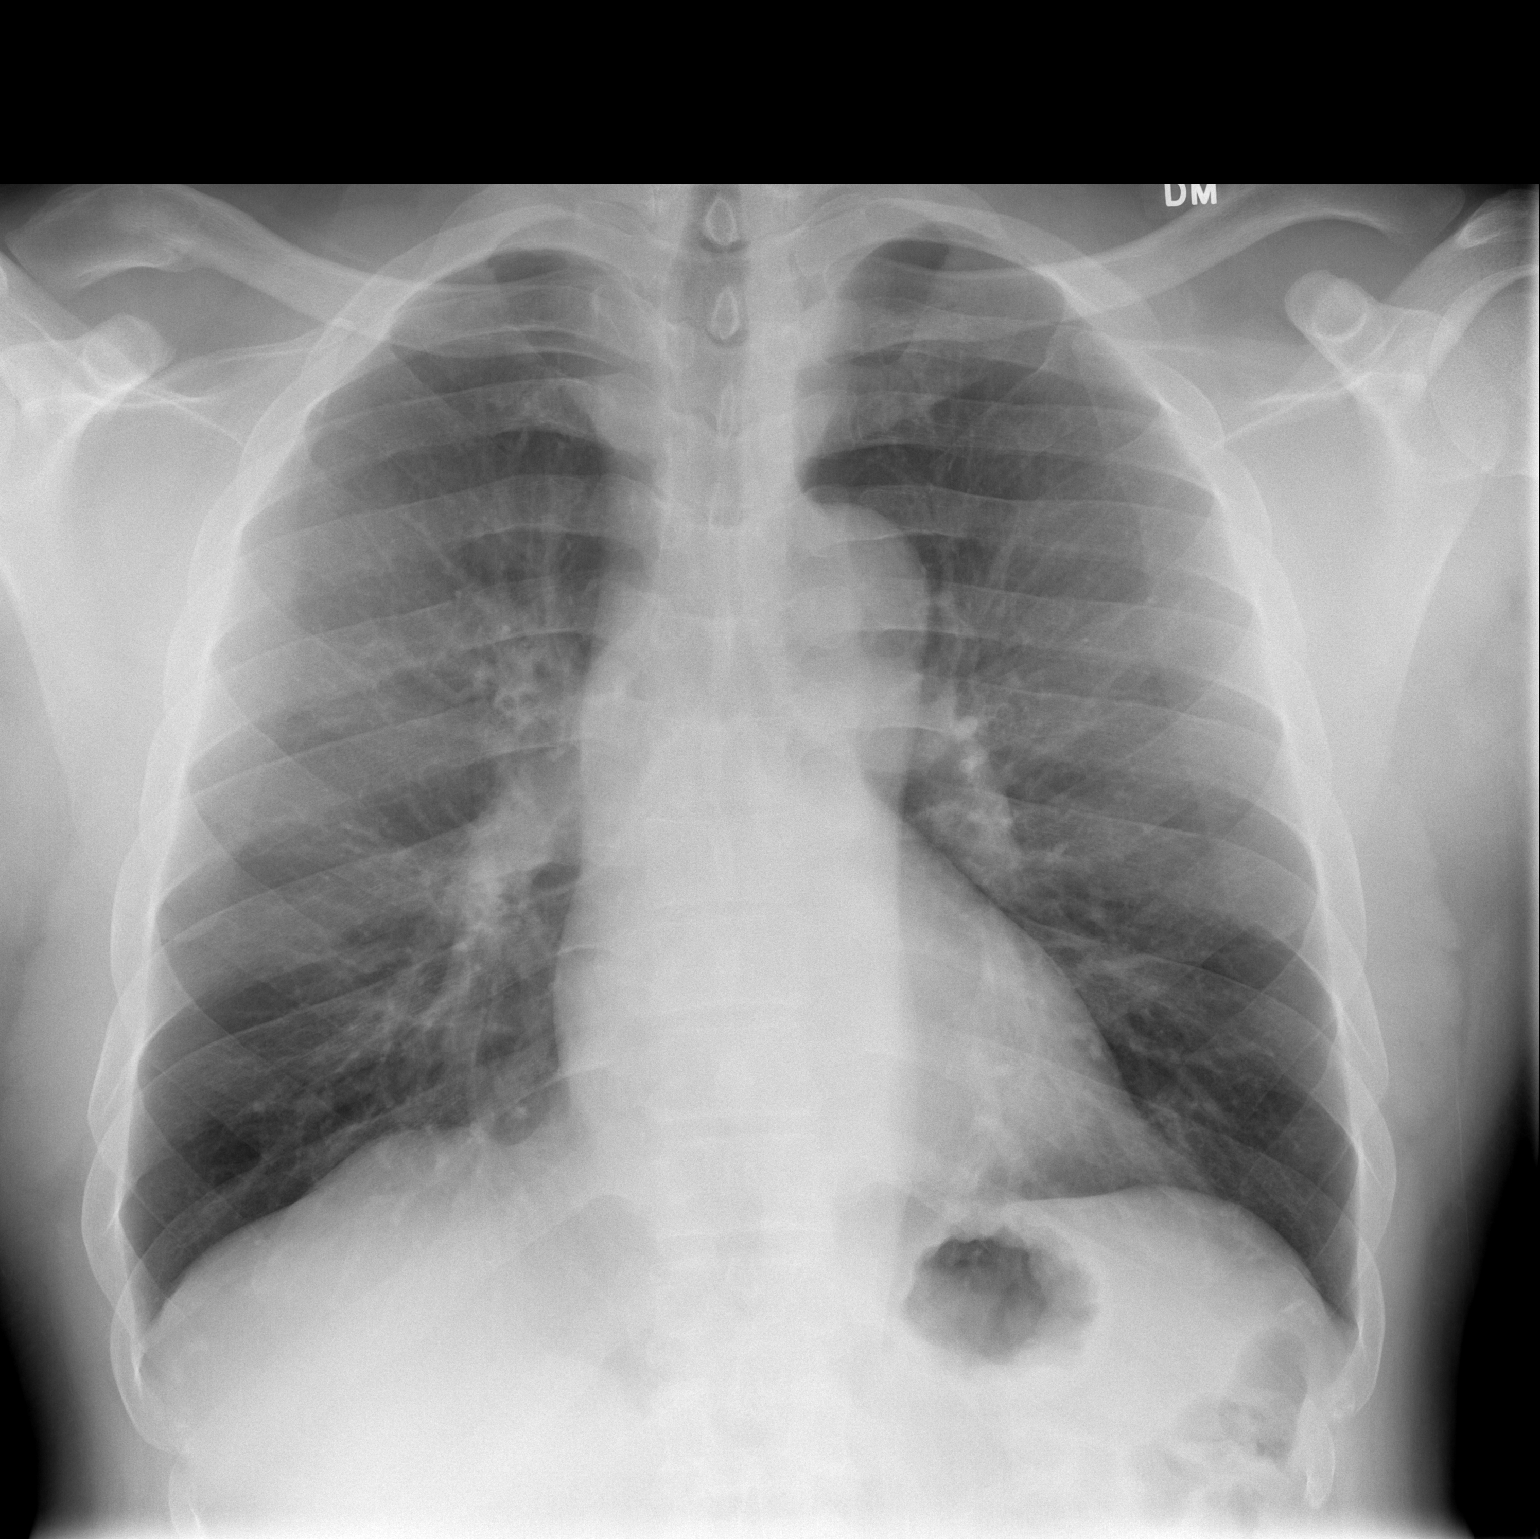

[w chest lat]
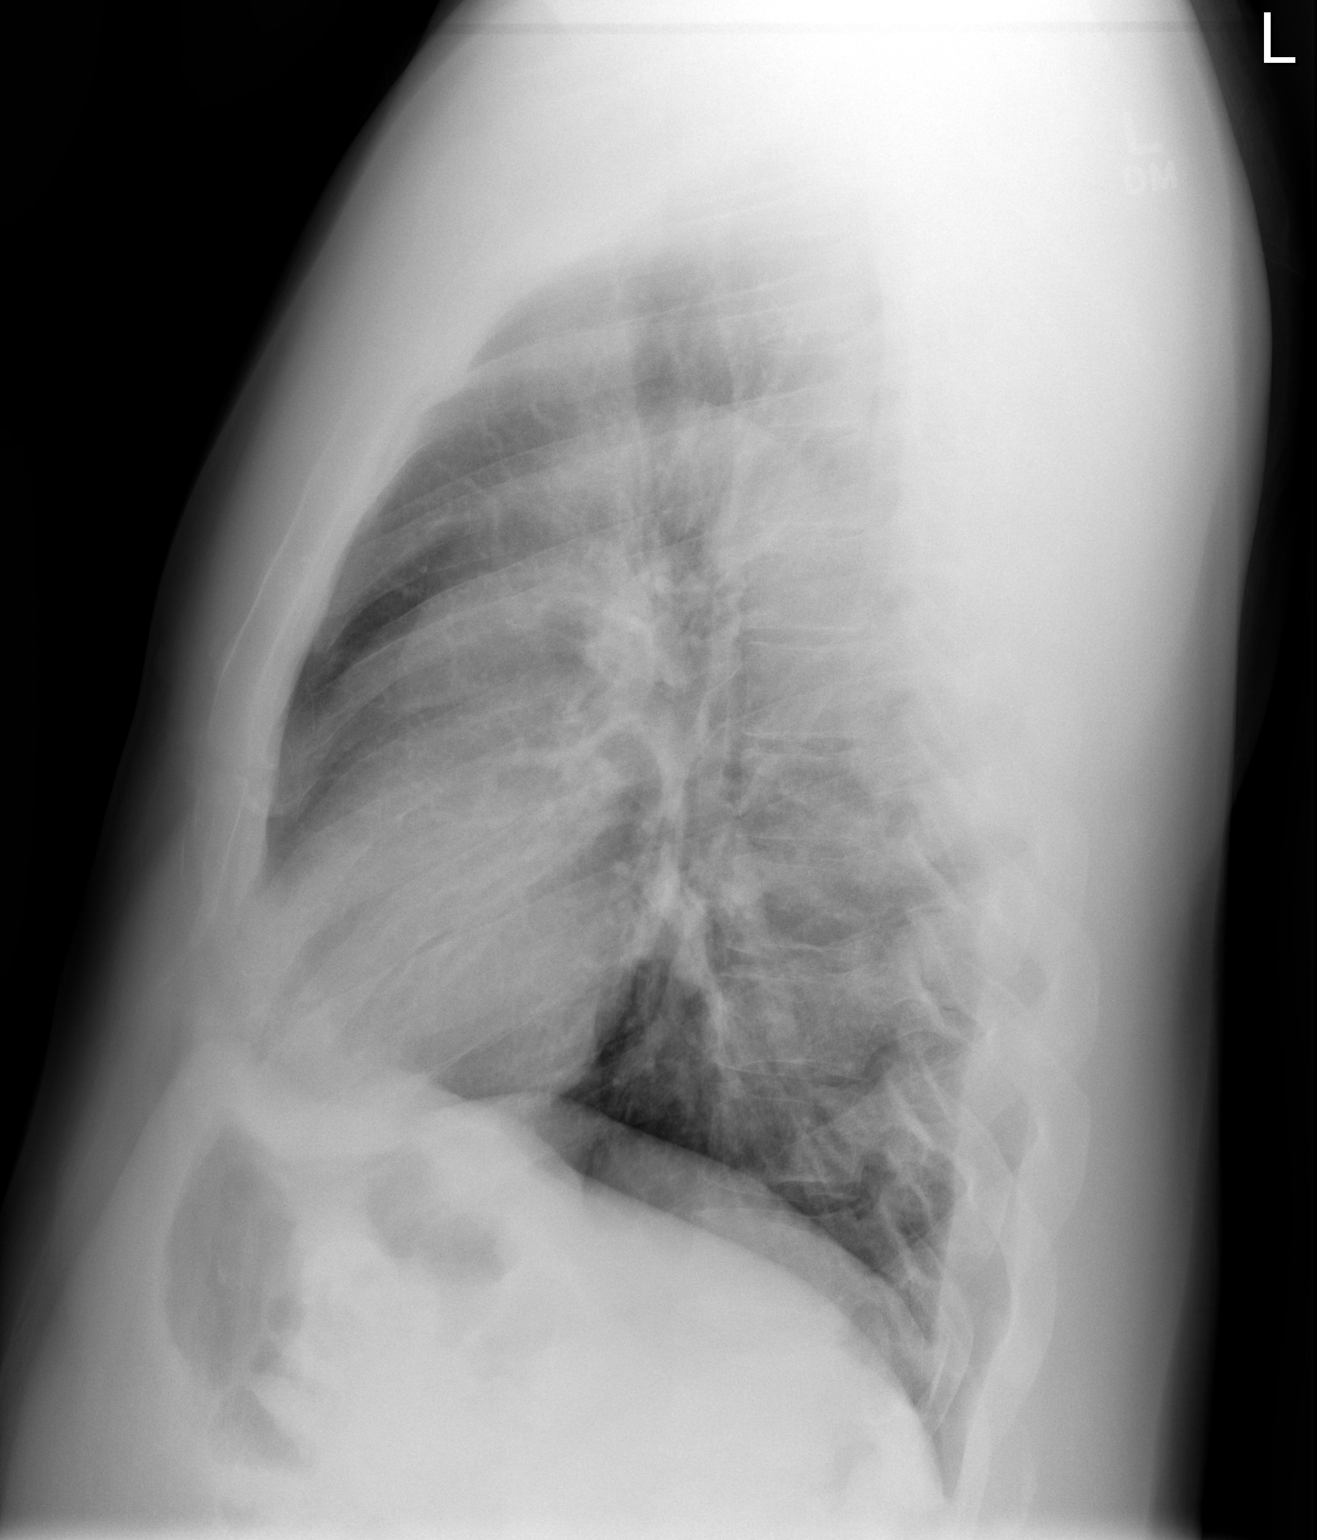

[2 of 2 positions shown; findings below may reference images not displayed]

FINDINGS: The heart size and mediastinal contours are stable.  The
lungs are mildly hyperinflated with mild central airway thickening.
There is no airspace disease, pleural effusion or pneumothorax.
Old fracture of the distal right clavicle appears stable.
IMPRESSION: Stable examination with mild central airway thickening attributed
to asthma.  No acute process identified.

## 2012-10-04 MED ORDER — ALBUTEROL SULFATE (5 MG/ML) 0.5% IN NEBU
5.0000 mg | INHALATION_SOLUTION | Freq: Once | RESPIRATORY_TRACT | Status: AC
  Administered 2012-10-04: 5 mg via RESPIRATORY_TRACT
  Filled 2012-10-04: qty 1

## 2012-10-04 MED ORDER — IPRATROPIUM BROMIDE 0.02 % IN SOLN
0.5000 mg | Freq: Once | RESPIRATORY_TRACT | Status: AC
  Administered 2012-10-04: 0.5 mg via RESPIRATORY_TRACT
  Filled 2012-10-04: qty 2.5

## 2012-10-04 MED ORDER — ALBUTEROL SULFATE HFA 108 (90 BASE) MCG/ACT IN AERS: 1.0000 | INHALATION_SPRAY | Freq: Four times a day (QID) | RESPIRATORY_TRACT | Status: DC | PRN

## 2012-10-04 MED ORDER — PREDNISONE 20 MG PO TABS: 60.0000 mg | ORAL_TABLET | Freq: Every day | ORAL | Status: DC

## 2012-10-04 MED ORDER — PREDNISONE 50 MG PO TABS
60.0000 mg | ORAL_TABLET | Freq: Once | ORAL | Status: AC
  Administered 2012-10-04: 60 mg via ORAL
  Filled 2012-10-04: qty 1

## 2012-10-04 NOTE — ED Provider Notes (Signed)
History     CSN: [id number]  Arrival date & time 10/04/12  4098   First MD Initiated Contact with Patient 10/04/12 [phone]      Chief Complaint  Patient presents with  . Asthma    (Consider location/radiation/quality/duration/timing/severity/associated sxs/prior treatment) Patient is a 47 y.o. male presenting with asthma. The history is provided by the patient.  Asthma This is a recurrent problem. Associated symptoms include abdominal pain, headaches and shortness of breath. Pertinent negatives include no chest pain.   patient has a history of asthma and has been increasing short of breath over the last couple days. He's had a nonproductive cough. No chest pain. No fevers. No clear sick contacts. He states he gets worse on the seasons change. Patient states he is on disability because of his asthma. He is previously been intubated, but not recently. He also has a dull headache and dull upper abdominal pain. He states this is not typical for his asthma exacerbation.  Past Medical History  Diagnosis Date  . Asthma   . Hypertension   . High cholesterol   . Depression     Past Surgical History  Procedure Laterality Date  . Abdominal surgery    . Appendectomy      History reviewed. No pertinent family history.  History  Substance Use Topics  . Smoking status: Never Smoker   . Smokeless tobacco: Not on file  . Alcohol Use: 1.2 oz/week    2 Cans of beer per week      Review of Systems  Constitutional: Negative for fever, chills, activity change, appetite change and fatigue.  HENT: Negative for neck stiffness.   Eyes: Negative for pain.  Respiratory: Positive for cough, shortness of breath and wheezing. Negative for chest tightness.   Cardiovascular: Negative for chest pain and leg swelling.  Gastrointestinal: Positive for abdominal pain. Negative for nausea, vomiting and diarrhea.  Genitourinary: Negative for flank pain.  Musculoskeletal: Negative for back pain.  Skin:  Negative for rash.  Neurological: Positive for headaches. Negative for weakness and numbness.  Psychiatric/Behavioral: Negative for behavioral problems.    Allergies  Shellfish allergy  Home Medications   Current Outpatient Rx  Name  Route  Sig  Dispense  Refill  . albuterol (PROVENTIL) (2.5 MG/3ML) 0.083% nebulizer solution   Nebulization   Take 2.5 mg by nebulization every 2 (two) hours as needed. For wheezing           . amLODipine (NORVASC) 10 MG tablet   Oral   Take 10 mg by mouth daily.           Marland Kitchen atorvastatin (LIPITOR) 10 MG tablet   Oral   Take 10 mg by mouth daily.         Marland Kitchen lisinopril (PRINIVIL,ZESTRIL) 20 MG tablet   Oral   Take 20 mg by mouth daily.           Marland Kitchen albuterol (PROVENTIL HFA;VENTOLIN HFA) 108 (90 BASE) MCG/ACT inhaler   Inhalation   Inhale 1-2 puffs into the lungs every 6 (six) hours as needed for wheezing.   1 Inhaler   5   . albuterol (PROVENTIL HFA;VENTOLIN HFA) 108 (90 BASE) MCG/ACT inhaler   Inhalation   Inhale 1-2 puffs into the lungs every 6 (six) hours as needed for wheezing.   1 Inhaler   0   . albuterol (PROVENTIL,VENTOLIN) 90 MCG/ACT inhaler   Inhalation   Inhale 4 puffs into the lungs 3 (three) times daily as needed. For shortness  of breath          . colesevelam (WELCHOL) 625 MG tablet   Oral   Take 1,250 mg by mouth every other day.           . predniSONE (DELTASONE) 20 MG tablet   Oral   Take 3 tablets (60 mg total) by mouth daily.   9 tablet   0   . predniSONE (DELTASONE) 50 MG tablet   Oral   Take 1 tablet (50 mg total) by mouth daily.   4 tablet   0     BP 164/97  Pulse 91  Temp(Src) 98.3 F (36.8 C) (Oral)  Resp 24  Ht 6\' 3"  (1.905 m)  Wt 252 lb (114.306 kg)  BMI 31.5 kg/m2  SpO2 97%  Physical Exam  Nursing note and vitals reviewed. Constitutional: He is oriented to person, place, and time. He appears well-developed and well-nourished.  HENT:  Head: Normocephalic and atraumatic.  Eyes:  EOM are normal. Pupils are equal, round, and reactive to light.  Neck: Normal range of motion. Neck supple.  Cardiovascular: Normal rate, regular rhythm and normal heart sounds.   No murmur heard. Pulmonary/Chest: Effort normal. He has wheezes.  Diffuse wheezes and prolonged expirations with mild clearing rails on the right lung field.  Abdominal: Soft. Bowel sounds are normal. He exhibits no distension and no mass. There is no tenderness. There is no rebound and no guarding.  Musculoskeletal: Normal range of motion. He exhibits no edema.  Neurological: He is alert and oriented to person, place, and time. No cranial nerve deficit.  Skin: Skin is warm and dry.  Psychiatric: He has a normal mood and affect.    ED Course  Procedures (including critical care time)  Labs Reviewed - No data to display Dg Chest 2 View  10/04/2012  *RADIOLOGY REPORT*  Clinical Data: Shortness of breath and wheezing.  CHEST - 2 VIEW  Comparison: 01/13/2012 and 09/12/2011.  Findings: The heart size and mediastinal contours are stable.  The lungs are mildly hyperinflated with mild central airway thickening. There is no airspace disease, pleural effusion or pneumothorax. Old fracture of the distal right clavicle appears stable.  IMPRESSION: Stable examination with mild central airway thickening attributed to asthma.  No acute process identified.   Original Report Authenticated By: Carey Bullocks, M.D.      1. Asthma       MDM  Patient with asthma exacerbation. Lungs are clear after 2 breathing treatments. His been given prednisone. He'll be discharged home with prescription for albuterol inhaler and a three-day course of prednisone.        Juliet Rude. Rubin Payor, MD 10/04/12 [phone]

## 2012-10-04 NOTE — Discharge Instructions (Signed)

## 2012-10-04 NOTE — ED Notes (Signed)
Pt states that for the past week he has had incr sob, and wheezing.  Pt has been using albuterol inhaler and nebulizer at home with no relief.

## 2013-04-08 HISTORY — PX: OTHER SURGICAL HISTORY: SHX169

## 2013-05-18 ENCOUNTER — Emergency Department (HOSPITAL_BASED_OUTPATIENT_CLINIC_OR_DEPARTMENT_OTHER)
Admission: EM | Admit: 2013-05-18 | Discharge: 2013-05-18 | Disposition: A | Discharge status: Home / Self Care | Payer: Medicaid Other | Attending: Emergency Medicine | Admitting: Emergency Medicine

## 2013-05-18 ENCOUNTER — Emergency Department (HOSPITAL_BASED_OUTPATIENT_CLINIC_OR_DEPARTMENT_OTHER): Payer: Medicaid Other

## 2013-05-18 ENCOUNTER — Encounter (HOSPITAL_BASED_OUTPATIENT_CLINIC_OR_DEPARTMENT_OTHER): Payer: Self-pay | Admitting: Emergency Medicine

## 2013-05-18 DIAGNOSIS — Z79899 Other long term (current) drug therapy: Secondary | ICD-10-CM | POA: Insufficient documentation

## 2013-05-18 DIAGNOSIS — Z8659 Personal history of other mental and behavioral disorders: Secondary | ICD-10-CM | POA: Insufficient documentation

## 2013-05-18 DIAGNOSIS — E7800 Pure hypercholesterolemia, unspecified: Secondary | ICD-10-CM | POA: Insufficient documentation

## 2013-05-18 DIAGNOSIS — Z8719 Personal history of other diseases of the digestive system: Secondary | ICD-10-CM | POA: Insufficient documentation

## 2013-05-18 DIAGNOSIS — S6390XA Sprain of unspecified part of unspecified wrist and hand, initial encounter: Secondary | ICD-10-CM | POA: Insufficient documentation

## 2013-05-18 DIAGNOSIS — J45909 Unspecified asthma, uncomplicated: Secondary | ICD-10-CM | POA: Insufficient documentation

## 2013-05-18 DIAGNOSIS — Y9367 Activity, basketball: Secondary | ICD-10-CM | POA: Insufficient documentation

## 2013-05-18 DIAGNOSIS — X500XXA Overexertion from strenuous movement or load, initial encounter: Secondary | ICD-10-CM | POA: Insufficient documentation

## 2013-05-18 DIAGNOSIS — IMO0001 Reserved for inherently not codable concepts without codable children: Secondary | ICD-10-CM

## 2013-05-18 DIAGNOSIS — I10 Essential (primary) hypertension: Secondary | ICD-10-CM | POA: Insufficient documentation

## 2013-05-18 DIAGNOSIS — IMO0002 Reserved for concepts with insufficient information to code with codable children: Secondary | ICD-10-CM | POA: Insufficient documentation

## 2013-05-18 DIAGNOSIS — Y9239 Other specified sports and athletic area as the place of occurrence of the external cause: Secondary | ICD-10-CM | POA: Insufficient documentation

## 2013-05-18 HISTORY — DX: Personal history of other diseases of the digestive system: Z87.19

## 2013-05-18 HISTORY — DX: Personal history of peptic ulcer disease: Z87.11

## 2013-05-18 IMAGING — CR DG FINGER THUMB 2+V*R*
3 series · 3 of 3 positions shown · non-contrast
Comparison: None.

CLINICAL DATA: Discomfort at the 1st metacarpophalangeal joint
status post injury

EXAM:
RIGHT THUMB 2+V

[x finger pa right]
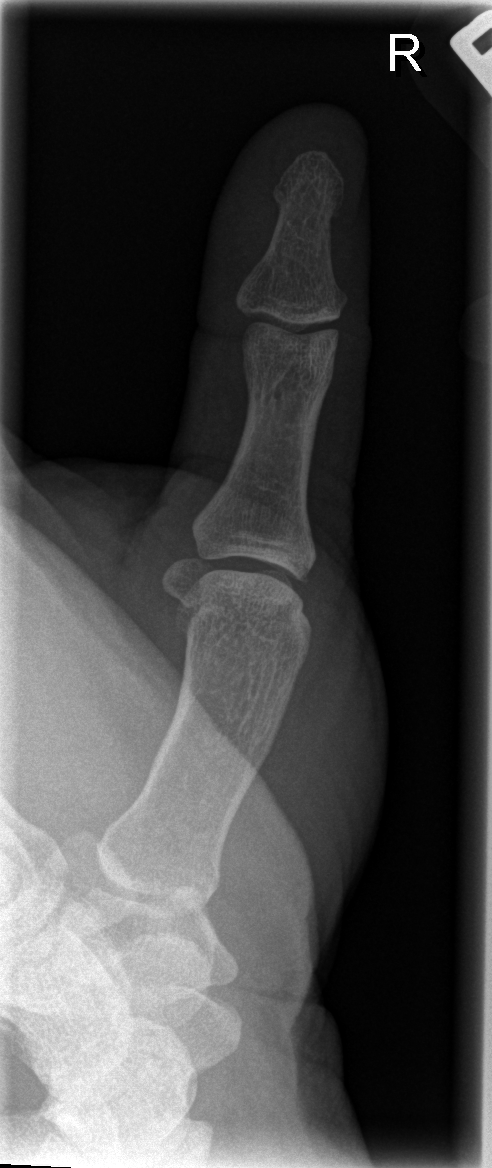

[x finger obl. right]
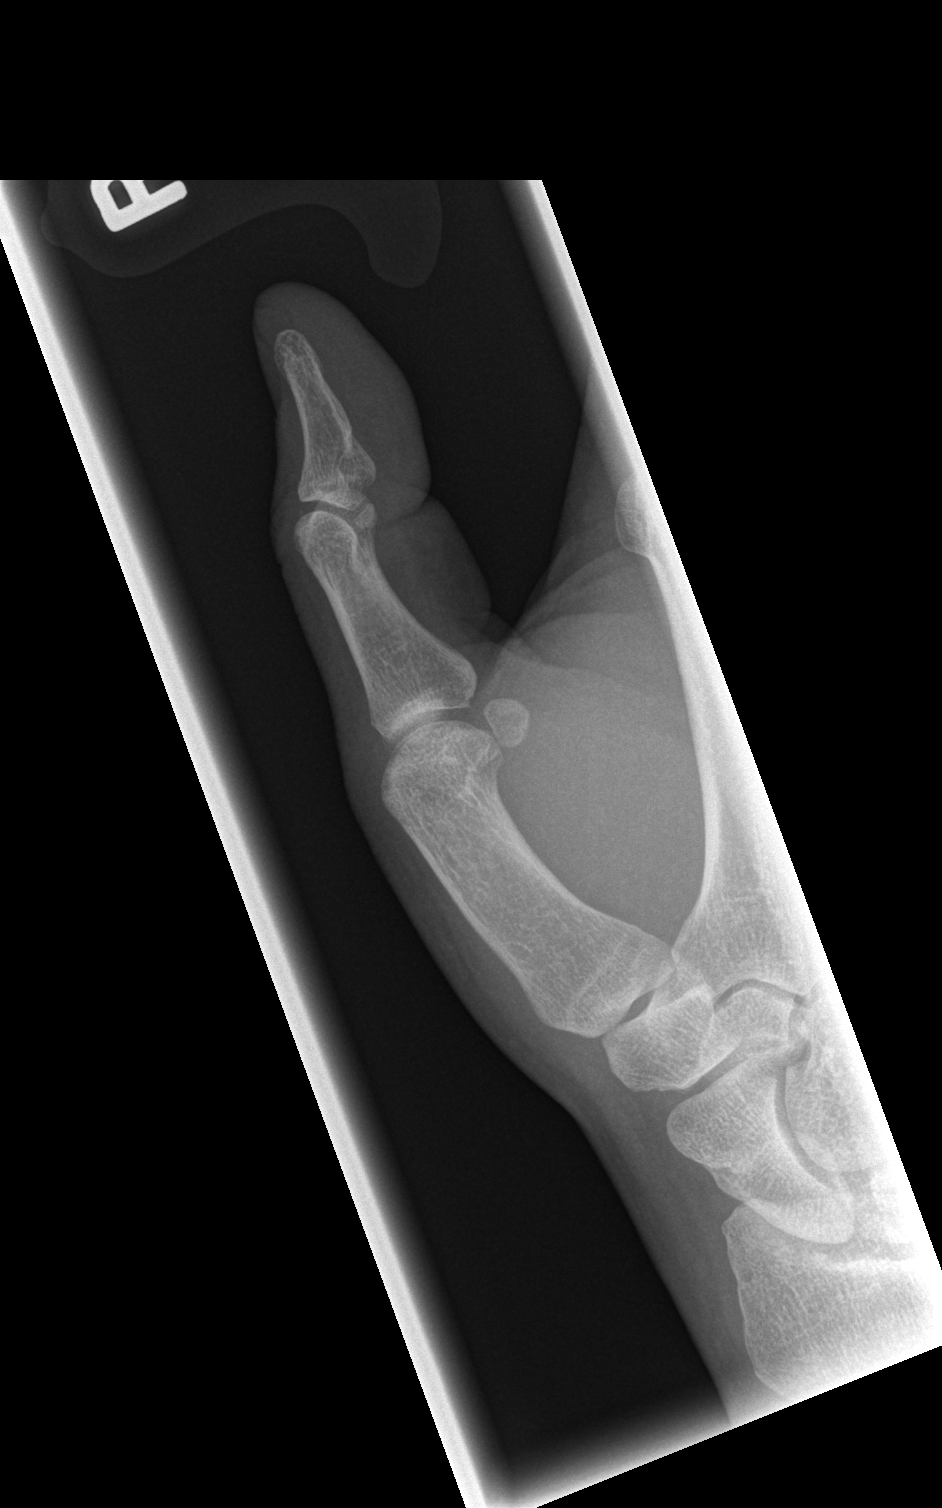

[x finger lateral right]
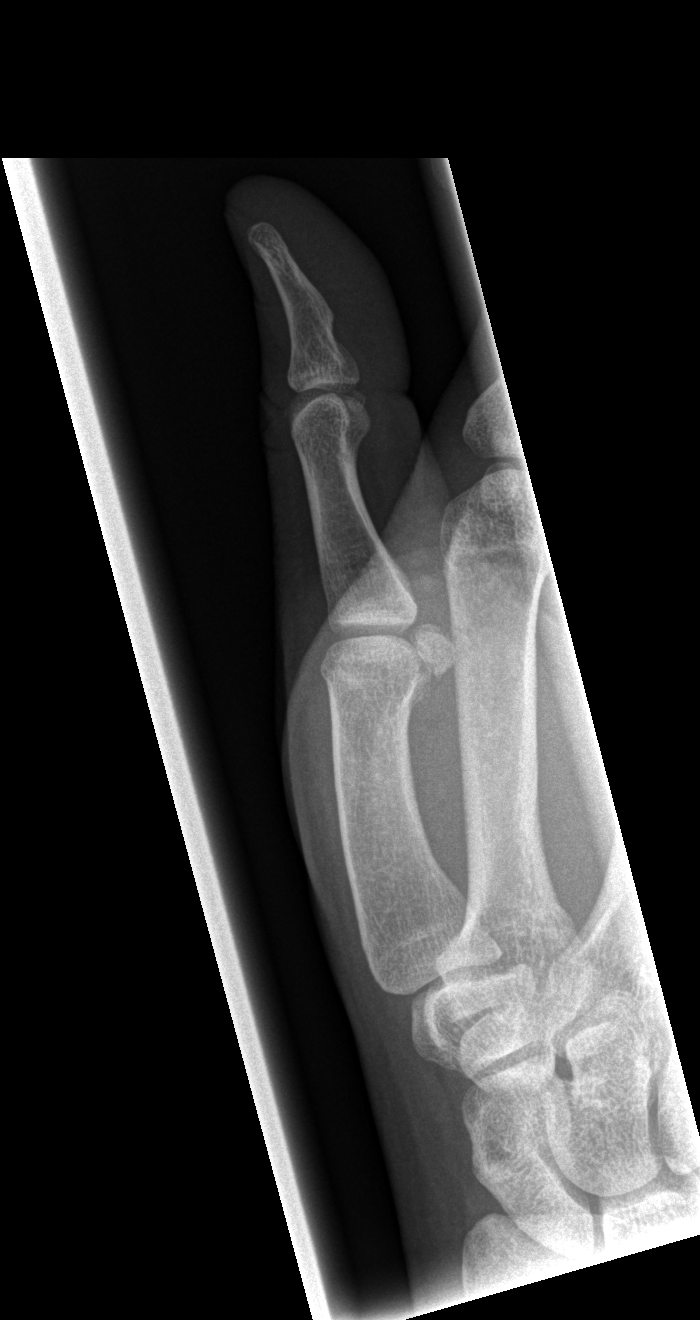

[3 of 3 positions shown; findings below may reference images not displayed]

FINDINGS: Three views of the right thumb reveal the bones to be adequately
mineralized. The interphalangeal joint and the metacarpal phalangeal
joint appear normal. The carpometacarpal joint also appears normal.
The overlying soft tissues are normal in appearance.
IMPRESSION: There is no evidence of an acute fracture nor periosteal reaction.
The soft tissues in the area of concern reveal no suspicious
findings. .
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
## 2013-05-18 NOTE — ED Provider Notes (Signed)
I saw and evaluated the patient, reviewed the resident's note and I agree with the findings and plan.   .Face to face Exam:  General:  Awake HEENT:  Atraumatic Resp:  Normal effort Abd:  Nondistended Neuro:No focal weakness Lymph: No adenopathy   Nelia Shi, MD 05/18/13 [phone]

## 2013-05-18 NOTE — ED Notes (Signed)
Patient states he injured his right thumb approximately two months ago while playing basketball.  States he can not lift any weight with his right thumb.

## 2013-07-09 HISTORY — PX: STRABISMUS SURGERY: SHX218

## 2013-12-02 ENCOUNTER — Encounter (HOSPITAL_BASED_OUTPATIENT_CLINIC_OR_DEPARTMENT_OTHER): Payer: Self-pay | Admitting: Emergency Medicine

## 2013-12-02 ENCOUNTER — Emergency Department (HOSPITAL_BASED_OUTPATIENT_CLINIC_OR_DEPARTMENT_OTHER)
Admission: EM | Admit: 2013-12-02 | Discharge: 2013-12-02 | Discharge status: Against Medical Advice | Payer: Medicaid Other | Attending: Emergency Medicine | Admitting: Emergency Medicine

## 2013-12-02 DIAGNOSIS — R51 Headache: Secondary | ICD-10-CM | POA: Insufficient documentation

## 2013-12-02 DIAGNOSIS — I10 Essential (primary) hypertension: Secondary | ICD-10-CM | POA: Insufficient documentation

## 2013-12-02 DIAGNOSIS — J45909 Unspecified asthma, uncomplicated: Secondary | ICD-10-CM | POA: Insufficient documentation

## 2013-12-02 NOTE — ED Notes (Signed)
Left sided HA since [address] this morning 6/10.  Sts he "does not get headaches."  Also sts he has been having to use his "asthma machine" more than usual due to a chest cold x2 months.

## 2013-12-02 NOTE — ED Notes (Signed)
After triage pt told nurse that he has to be out by 5:15.  Pt informed that all rooms are full and there are people waiting in the waiting room so it is not likely that the patient will be treated and discharged by 1715. Pt stated that he will come back in the morning.  Sts "I usually come in the morning."  Pt encouraged to stay and find another ride home but he would not.  Pt was told to come back if his HA got any worse or for any new or worse sx.

## 2013-12-05 ENCOUNTER — Other Ambulatory Visit: Payer: Self-pay

## 2013-12-05 ENCOUNTER — Emergency Department (HOSPITAL_BASED_OUTPATIENT_CLINIC_OR_DEPARTMENT_OTHER): Payer: Medicaid Other

## 2013-12-05 ENCOUNTER — Encounter (HOSPITAL_BASED_OUTPATIENT_CLINIC_OR_DEPARTMENT_OTHER): Payer: Self-pay | Admitting: Emergency Medicine

## 2013-12-05 ENCOUNTER — Emergency Department (HOSPITAL_BASED_OUTPATIENT_CLINIC_OR_DEPARTMENT_OTHER)
Admission: EM | Admit: 2013-12-05 | Discharge: 2013-12-05 | Disposition: A | Discharge status: Home / Self Care | Payer: Medicaid Other | Attending: Emergency Medicine | Admitting: Emergency Medicine

## 2013-12-05 DIAGNOSIS — F329 Major depressive disorder, single episode, unspecified: Secondary | ICD-10-CM | POA: Insufficient documentation

## 2013-12-05 DIAGNOSIS — E7800 Pure hypercholesterolemia, unspecified: Secondary | ICD-10-CM | POA: Insufficient documentation

## 2013-12-05 DIAGNOSIS — Z792 Long term (current) use of antibiotics: Secondary | ICD-10-CM | POA: Insufficient documentation

## 2013-12-05 DIAGNOSIS — I10 Essential (primary) hypertension: Secondary | ICD-10-CM | POA: Insufficient documentation

## 2013-12-05 DIAGNOSIS — J45901 Unspecified asthma with (acute) exacerbation: Secondary | ICD-10-CM | POA: Insufficient documentation

## 2013-12-05 DIAGNOSIS — R0682 Tachypnea, not elsewhere classified: Secondary | ICD-10-CM | POA: Insufficient documentation

## 2013-12-05 DIAGNOSIS — IMO0002 Reserved for concepts with insufficient information to code with codable children: Secondary | ICD-10-CM | POA: Insufficient documentation

## 2013-12-05 DIAGNOSIS — F3289 Other specified depressive episodes: Secondary | ICD-10-CM | POA: Insufficient documentation

## 2013-12-05 DIAGNOSIS — J159 Unspecified bacterial pneumonia: Secondary | ICD-10-CM | POA: Insufficient documentation

## 2013-12-05 DIAGNOSIS — J189 Pneumonia, unspecified organism: Secondary | ICD-10-CM

## 2013-12-05 DIAGNOSIS — Z79899 Other long term (current) drug therapy: Secondary | ICD-10-CM | POA: Insufficient documentation

## 2013-12-05 LAB — BASIC METABOLIC PANEL
BUN: 13 mg/dL (ref 6–23)
CO2: 26 mEq/L (ref 19–32)
Calcium: 9.6 mg/dL (ref 8.4–10.5)
Chloride: 95 mEq/L — ABNORMAL LOW (ref 96–112)
Creatinine, Ser: 1.1 mg/dL (ref 0.50–1.35)
GFR calc Af Amer: >90 mL/min (ref >90)
GFR calc non Af Amer: 79 mL/min — ABNORMAL LOW (ref >90)
Glucose, Bld: 115 mg/dL — ABNORMAL HIGH (ref 70–99)
Potassium: 3.9 mEq/L (ref 3.7–5.3)
Sodium: 137 mEq/L (ref 137–147)

## 2013-12-05 LAB — CBC
HCT: 41.7 % (ref 39.0–52.0)
Hemoglobin: 14.3 g/dL (ref 13.0–17.0)
MCH: 27.4 pg (ref 26.0–34.0)
MCHC: 34.3 g/dL (ref 30.0–36.0)
MCV: 80 fL (ref 78.0–100.0)
Platelets: 182 10*3/uL (ref 150–400)
RBC: 5.21 MIL/uL (ref 4.22–5.81)
RDW: 14.9 % (ref 11.5–15.5)
WBC: 16 10*3/uL — ABNORMAL HIGH (ref 4.0–10.5)

## 2013-12-05 LAB — TROPONIN I: Troponin I: <0.3 ng/mL (ref <0.30)

## 2013-12-05 LAB — PRO B NATRIURETIC PEPTIDE: Pro B Natriuretic peptide (BNP): <5 pg/mL (ref 0–125)

## 2013-12-05 IMAGING — CR DG CHEST 2V
2 series · 2 of 2 positions shown · non-contrast
Comparison: [DATE]

CLINICAL DATA: Shortness of breath

EXAM:
CHEST  2 VIEW

[w chest pa]
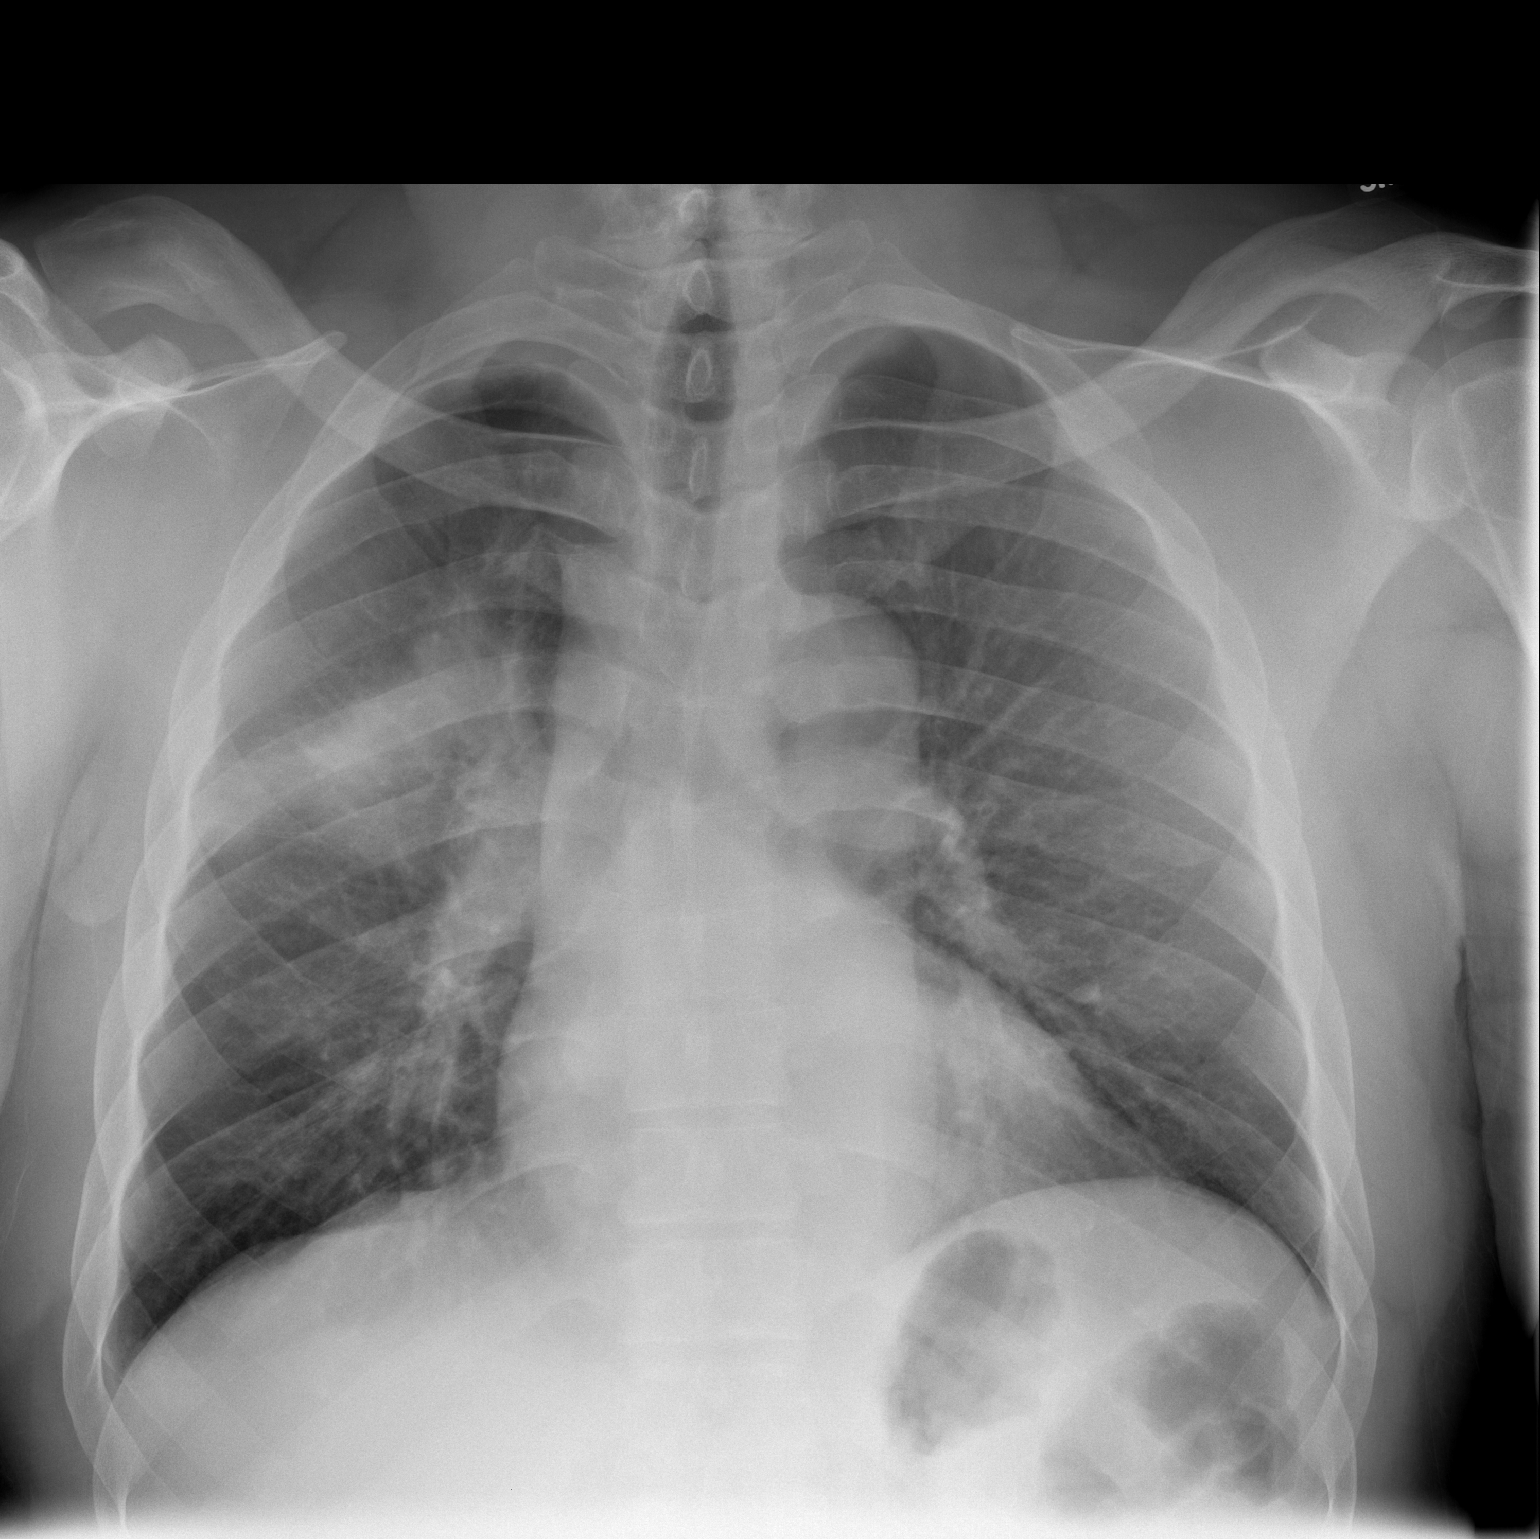

[w chest lat]
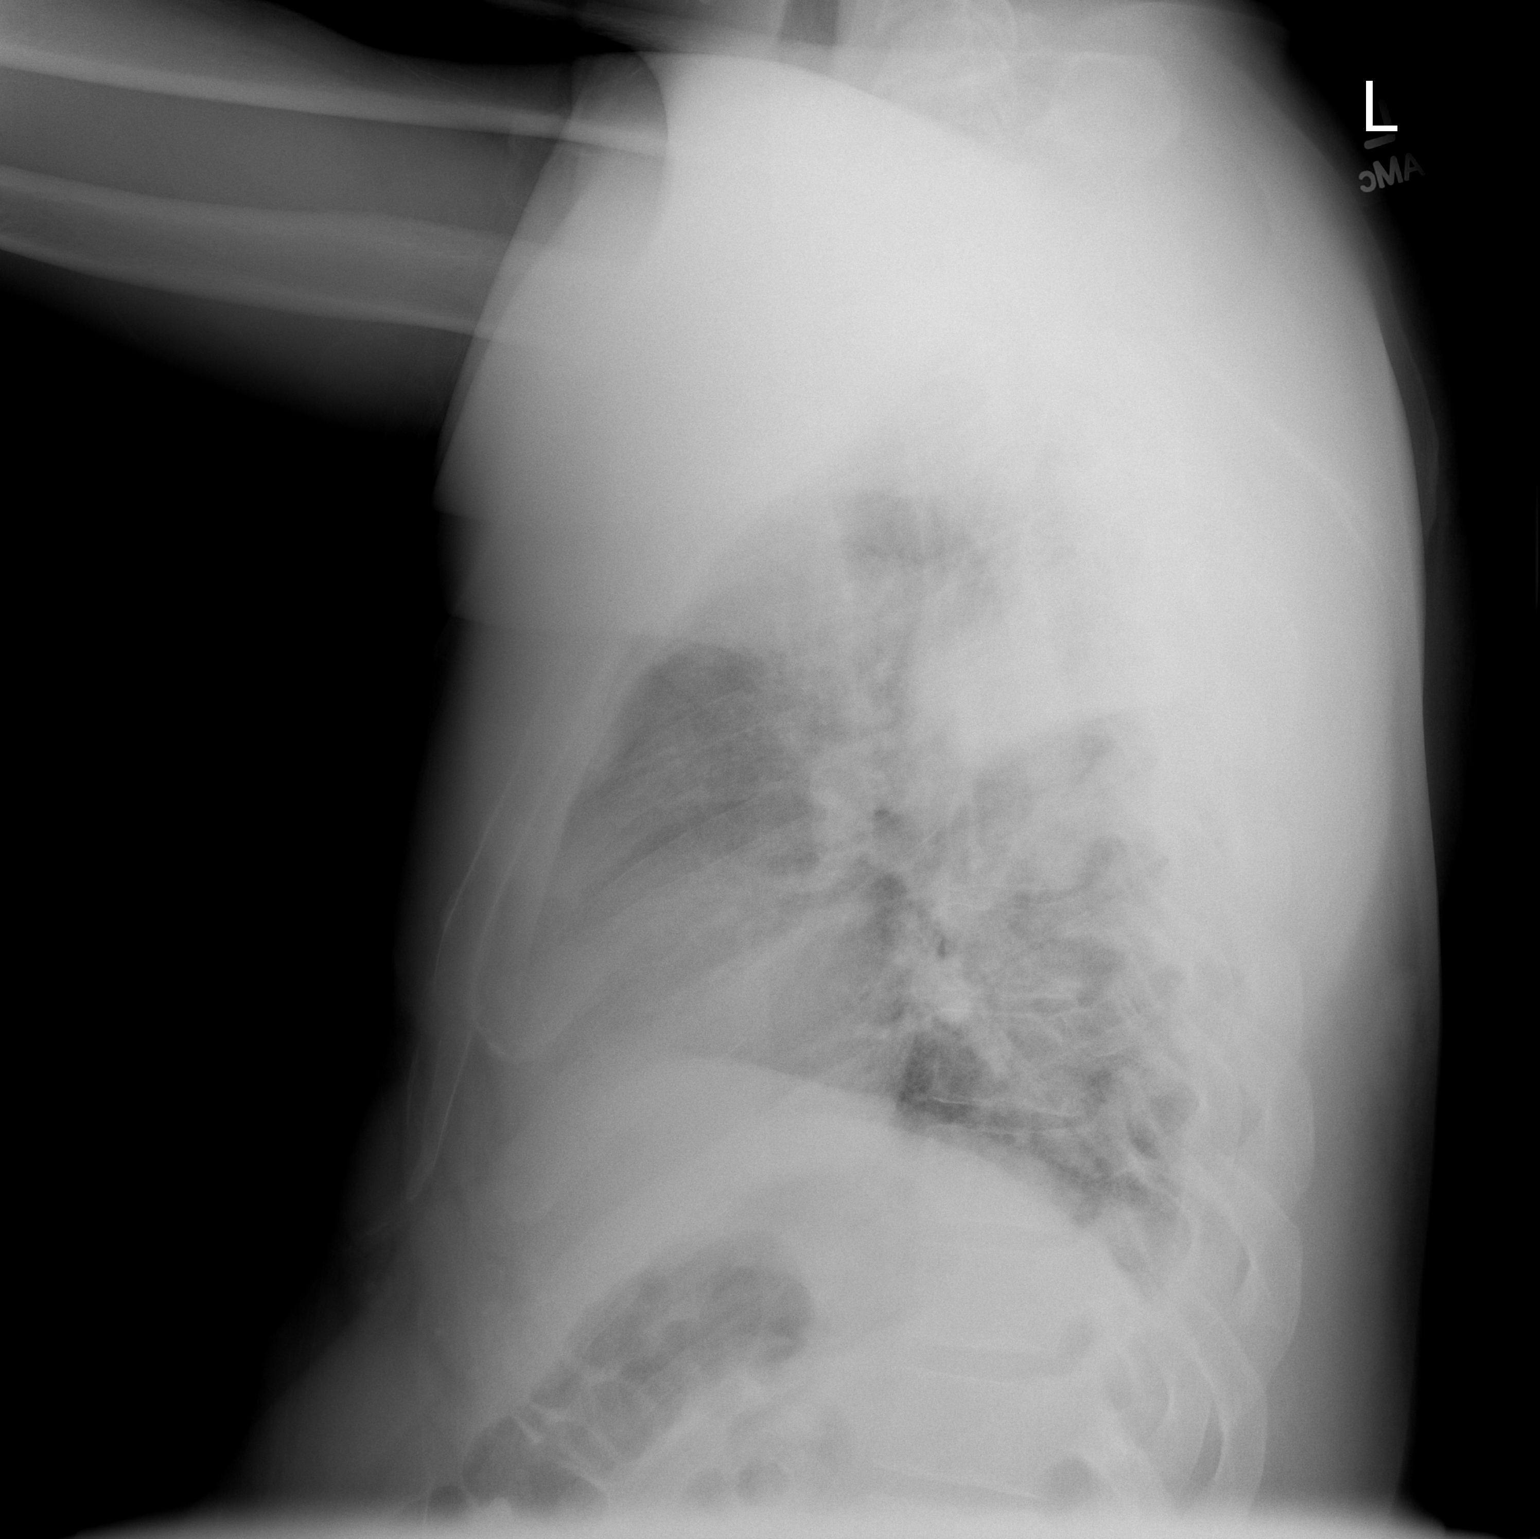

[2 of 2 positions shown; findings below may reference images not displayed]

FINDINGS: Airspace opacity in theposterior segment right upper lobe. No
definitive mediastinal or hilar lymphadenopathy. Normal heart size.
No effusion or pneumothorax.
IMPRESSION: Right upper lobe pneumonia. Recommend outpatient followup to
clearing.

## 2013-12-05 MED ORDER — PREDNISONE 50 MG PO TABS: 50.0000 mg | ORAL_TABLET | Freq: Every day | ORAL | Status: DC

## 2013-12-05 MED ORDER — ACETAMINOPHEN 325 MG PO TABS
ORAL_TABLET | ORAL | Status: AC
  Filled 2013-12-05: qty 2

## 2013-12-05 MED ORDER — ALBUTEROL SULFATE HFA 108 (90 BASE) MCG/ACT IN AERS: 1.0000 | INHALATION_SPRAY | Freq: Four times a day (QID) | RESPIRATORY_TRACT | Status: DC | PRN

## 2013-12-05 MED ORDER — ALBUTEROL SULFATE (2.5 MG/3ML) 0.083% IN NEBU
5.0000 mg | INHALATION_SOLUTION | Freq: Once | RESPIRATORY_TRACT | Status: AC
Start: 2013-12-05 — End: 2013-12-05
  Administered 2013-12-05: 5 mg via RESPIRATORY_TRACT

## 2013-12-05 MED ORDER — BUDESONIDE-FORMOTEROL FUMARATE 160-4.5 MCG/ACT IN AERO: 2.0000 | INHALATION_SPRAY | Freq: Two times a day (BID) | RESPIRATORY_TRACT | Status: DC

## 2013-12-05 MED ORDER — IPRATROPIUM-ALBUTEROL 0.5-2.5 (3) MG/3ML IN SOLN
3.0000 mL | Freq: Once | RESPIRATORY_TRACT | Status: AC
  Administered 2013-12-05: 3 mL via RESPIRATORY_TRACT
  Filled 2013-12-05: qty 3

## 2013-12-05 MED ORDER — ALBUTEROL SULFATE (2.5 MG/3ML) 0.083% IN NEBU
2.5000 mg | INHALATION_SOLUTION | Freq: Once | RESPIRATORY_TRACT | Status: AC
  Administered 2013-12-05: 2.5 mg via RESPIRATORY_TRACT

## 2013-12-05 MED ORDER — ALBUTEROL SULFATE (2.5 MG/3ML) 0.083% IN NEBU
5.0000 mg | INHALATION_SOLUTION | Freq: Once | RESPIRATORY_TRACT | Status: DC
Start: 2013-12-05 — End: 2013-12-05
  Filled 2013-12-05: qty 6

## 2013-12-05 MED ORDER — AZITHROMYCIN 250 MG PO TABS
500.0000 mg | ORAL_TABLET | Freq: Once | ORAL | Status: AC
  Administered 2013-12-05: 500 mg via ORAL
  Filled 2013-12-05: qty 2

## 2013-12-05 MED ORDER — ACETAMINOPHEN 325 MG PO TABS
650.0000 mg | ORAL_TABLET | Freq: Once | ORAL | Status: AC
  Administered 2013-12-05: 650 mg via ORAL

## 2013-12-05 MED ORDER — AZITHROMYCIN 250 MG PO TABS: 250.0000 mg | ORAL_TABLET | Freq: Every day | ORAL | Status: DC

## 2013-12-05 MED ORDER — ALBUTEROL SULFATE (2.5 MG/3ML) 0.083% IN NEBU
INHALATION_SOLUTION | RESPIRATORY_TRACT | Status: AC
  Administered 2013-12-05: 5 mg via RESPIRATORY_TRACT
  Filled 2013-12-05: qty 6

## 2013-12-05 MED ORDER — PREDNISONE 50 MG PO TABS
60.0000 mg | ORAL_TABLET | Freq: Once | ORAL | Status: AC
  Administered 2013-12-05: 60 mg via ORAL
  Filled 2013-12-05 (×2): qty 1

## 2013-12-05 NOTE — ED Notes (Signed)
Pt c/o SOB/productive cough x5 days, yellow sputum, audible wheezing

## 2013-12-05 NOTE — ED Notes (Signed)
Ambulated patient in hall on room air, SpO2 95-96%, HR 121-130, RR 30.  No increased DOE per patient.  MD aware of results.
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
## 2013-12-05 NOTE — Discharge Instructions (Signed)
Your Xray shows that you have a pneumonia. 1st dose of antibiotics and prednisone given today. Start taking the meds prescribed starting tomorrow. RETURN TO THE ER IF THERE IS ANY WORSENING OF CONDITION, DIFFICULTY BREATHING. See your primary doctor in 1 week. DO NOT TAKE 30 TREATMENTS AT HOME - THAT IS DANGEROUS.   Asthma, Adult Asthma is a recurring condition in which the airways tighten and narrow. Asthma can make it difficult to breathe. It can cause coughing, wheezing, and shortness of breath. Asthma episodes (also called asthma attacks) range from minor to life-threatening. Asthma cannot be cured, but medicines and lifestyle changes can help control it. CAUSES Asthma is believed to be caused by inherited (genetic) and environmental factors, but its exact cause is unknown. Asthma may be triggered by allergens, lung infections, or irritants in the air. Asthma triggers are different for each person. Common triggers include:   Animal dander.  Dust mites.  Cockroaches.  Pollen from trees or grass.  Mold.  Smoke.  Air pollutants such as dust, household cleaners, hair sprays, aerosol sprays, paint fumes, strong chemicals, or strong odors.  Cold air, weather changes, and winds (which increase molds and pollens in the air).  Strong emotional expressions such as crying or laughing hard.  Stress.  Certain medicines (such as aspirin) or types of drugs (such as beta-blockers).  Sulfites in foods and drinks. Foods and drinks that may contain sulfites include dried fruit, potato chips, and sparkling grape juice.  Infections or inflammatory conditions such as the flu, a cold, or an inflammation of the nasal membranes (rhinitis).  Gastroesophageal reflux disease (GERD).  Exercise or strenuous activity. SYMPTOMS Symptoms may occur immediately after asthma is triggered or many hours later. Symptoms include:  Wheezing.  Excessive nighttime or early morning coughing.  Frequent or  severe coughing with a common cold.  Chest tightness.  Shortness of breath. DIAGNOSIS  The diagnosis of asthma is made by a review of your medical history and a physical exam. Tests may also be performed. These may include:  Lung function studies. These tests show how much air you breath in and out.  Allergy tests.  Imaging tests such as X-rays. TREATMENT  Asthma cannot be cured, but it can usually be controlled. Treatment involves identifying and avoiding your asthma triggers. It also involves medicines. There are 2 classes of medicine used for asthma treatment:   Controller medicines. These prevent asthma symptoms from occurring. They are usually taken every day.  Reliever or rescue medicines. These quickly relieve asthma symptoms. They are used as needed and provide short-term relief. Your health care provider will help you create an asthma action plan. An asthma action plan is a written plan for managing and treating your asthma attacks. It includes a list of your asthma triggers and how they may be avoided. It also includes information on when medicines should be taken and when their dosage should be changed. An action plan may also involve the use of a device called a peak flow meter. A peak flow meter measures how well the lungs are working. It helps you monitor your condition. HOME CARE INSTRUCTIONS   Take medicine as directed by your health care provider. Speak with your health care provider if you have questions about how or when to take the medicines.  Use a peak flow meter as directed by your health care provider. Record and keep track of readings.  Understand and use the action plan to help minimize or stop an asthma attack without  needing to seek medical care.  Control your home environment in the following ways to help prevent asthma attacks:  Do not smoke. Avoid being exposed to secondhand smoke.  Change your heating and air conditioning filter regularly.  Limit your  use of fireplaces and wood stoves.  Get rid of pests (such as roaches and mice) and their droppings.  Throw away plants if you see mold on them.  Clean your floors and dust regularly. Use unscented cleaning products.  Try to have someone else vacuum for you regularly. Stay out of rooms while they are being vacuumed and for a short while afterward. If you vacuum, use a dust mask from a hardware store, a double-layered or microfilter vacuum cleaner bag, or a vacuum cleaner with a HEPA filter.  Replace carpet with wood, tile, or vinyl flooring. Carpet can trap dander and dust.  Use allergy-proof pillows, mattress covers, and box spring covers.  Wash bed sheets and blankets every week in hot water and dry them in a dryer.  Use blankets that are made of polyester or cotton.  Clean bathrooms and kitchens with bleach. If possible, have someone repaint the walls in these rooms with mold-resistant paint. Keep out of the rooms that are being cleaned and painted.  Wash hands frequently. SEEK MEDICAL CARE IF:   You have wheezing, shortness of breath, or a cough even if taking medicine to prevent attacks.  The colored mucus you cough up (sputum) is thicker than usual.  Your sputum changes from clear or white to yellow, green, gray, or bloody.  You have any problems that may be related to the medicines you are taking (such as a rash, itching, swelling, or trouble breathing).  You are using a reliever medicine more than 2 3 times per week.  Your peak flow is still at 50 79% of you personal best after following your action plan for 1 hour. SEEK IMMEDIATE MEDICAL CARE IF:   You seem to be getting worse and are unresponsive to treatment during an asthma attack.  You are short of breath even at rest.  You get short of breath when doing very little physical activity.  You have difficulty eating, drinking, or talking due to asthma symptoms.  You develop chest pain.  You develop a fast  heartbeat.  You have a bluish color to your lips or fingernails.  You are lightheaded, dizzy, or faint.  Your peak flow is less than 50% of your personal best.  You have a fever or persistent symptoms for more than 2 3 days.  You have a fever and symptoms suddenly get worse. MAKE SURE YOU:   Understand these instructions.  Will watch your condition.  Will get help right away if you are not doing well or get worse. Document Released: 06/25/2005 Document Revised: 02/25/2013 Document Reviewed: 01/22/2013 [facility] Patient Information 2014 [place], [place].  Pneumonia, Adult Pneumonia is an infection of the lungs.  CAUSES Pneumonia may be caused by bacteria or a virus. Usually, these infections are caused by breathing infectious particles into the lungs (respiratory tract). SYMPTOMS   Cough.  Fever.  Chest pain.  Increased rate of breathing.  Wheezing.  Mucus production. DIAGNOSIS  If you have the common symptoms of pneumonia, your caregiver will typically confirm the diagnosis with a chest X-ray. The X-ray will show an abnormality in the lung (pulmonary infiltrate) if you have pneumonia. Other tests of your blood, urine, or sputum may be done to find the specific cause of your pneumonia.  Your caregiver may also do tests (blood gases or pulse oximetry) to see how well your lungs are working. TREATMENT  Some forms of pneumonia may be spread to other people when you cough or sneeze. You may be asked to wear a mask before and during your exam. Pneumonia that is caused by bacteria is treated with antibiotic medicine. Pneumonia that is caused by the influenza virus may be treated with an antiviral medicine. Most other viral infections must run their course. These infections will not respond to antibiotics.  PREVENTION A pneumococcal shot (vaccine) is available to prevent a common bacterial cause of pneumonia. This is usually suggested for:  People over 84 years old.  Patients on  chemotherapy.  People with chronic lung problems, such as bronchitis or emphysema.  People with immune system problems. If you are over 65 or have a high risk condition, you may receive the pneumococcal vaccine if you have not received it before. In some countries, a routine influenza vaccine is also recommended. This vaccine can help prevent some cases of pneumonia.You may be offered the influenza vaccine as part of your care. If you smoke, it is time to quit. You may receive instructions on how to stop smoking. Your caregiver can provide medicines and counseling to help you quit. HOME CARE INSTRUCTIONS   Cough suppressants may be used if you are losing too much rest. However, coughing protects you by clearing your lungs. You should avoid using cough suppressants if you can.  Your caregiver may have prescribed medicine if he or she thinks your pneumonia is caused by a bacteria or influenza. Finish your medicine even if you start to feel better.  Your caregiver may also prescribe an expectorant. This loosens the mucus to be coughed up.  Only take over-the-counter or prescription medicines for pain, discomfort, or fever as directed by your caregiver.  Do not smoke. Smoking is a common cause of bronchitis and can contribute to pneumonia. If you are a smoker and continue to smoke, your cough may last several weeks after your pneumonia has cleared.  A cold steam vaporizer or humidifier in your room or home may help loosen mucus.  Coughing is often worse at night. Sleeping in a semi-upright position in a recliner or using a couple pillows under your head will help with this.  Get rest as you feel it is needed. Your body will usually let you know when you need to rest. SEEK IMMEDIATE MEDICAL CARE IF:   Your illness becomes worse. This is especially true if you are elderly or weakened from any other disease.  You cannot control your cough with suppressants and are losing sleep.  You begin  coughing up blood.  You develop pain which is getting worse or is uncontrolled with medicines.  You have a fever.  Any of the symptoms which initially brought you in for treatment are getting worse rather than better.  You develop shortness of breath or chest pain. MAKE SURE YOU:   Understand these instructions.  Will watch your condition.  Will get help right away if you are not doing well or get worse. Document Released: 06/25/2005 Document Revised: 09/17/2011 Document Reviewed: 09/14/2010 [facility] Patient Information 2014 [place], [place].

## 2014-03-13 ENCOUNTER — Emergency Department (HOSPITAL_BASED_OUTPATIENT_CLINIC_OR_DEPARTMENT_OTHER)
Admission: EM | Admit: 2014-03-13 | Discharge: 2014-03-13 | Disposition: A | Discharge status: Home / Self Care | Payer: Medicaid Other | Attending: Emergency Medicine | Admitting: Emergency Medicine

## 2014-03-13 ENCOUNTER — Encounter (HOSPITAL_BASED_OUTPATIENT_CLINIC_OR_DEPARTMENT_OTHER): Payer: Self-pay | Admitting: Emergency Medicine

## 2014-03-13 ENCOUNTER — Emergency Department (HOSPITAL_BASED_OUTPATIENT_CLINIC_OR_DEPARTMENT_OTHER): Payer: Medicaid Other

## 2014-03-13 DIAGNOSIS — Z79899 Other long term (current) drug therapy: Secondary | ICD-10-CM | POA: Diagnosis not present

## 2014-03-13 DIAGNOSIS — R1084 Generalized abdominal pain: Secondary | ICD-10-CM | POA: Insufficient documentation

## 2014-03-13 DIAGNOSIS — R197 Diarrhea, unspecified: Secondary | ICD-10-CM | POA: Diagnosis not present

## 2014-03-13 DIAGNOSIS — Z9889 Other specified postprocedural states: Secondary | ICD-10-CM | POA: Insufficient documentation

## 2014-03-13 DIAGNOSIS — Z792 Long term (current) use of antibiotics: Secondary | ICD-10-CM | POA: Insufficient documentation

## 2014-03-13 DIAGNOSIS — I10 Essential (primary) hypertension: Secondary | ICD-10-CM | POA: Diagnosis not present

## 2014-03-13 DIAGNOSIS — J45909 Unspecified asthma, uncomplicated: Secondary | ICD-10-CM | POA: Diagnosis not present

## 2014-03-13 DIAGNOSIS — Z8659 Personal history of other mental and behavioral disorders: Secondary | ICD-10-CM | POA: Diagnosis not present

## 2014-03-13 DIAGNOSIS — Z8719 Personal history of other diseases of the digestive system: Secondary | ICD-10-CM | POA: Diagnosis not present

## 2014-03-13 DIAGNOSIS — R112 Nausea with vomiting, unspecified: Secondary | ICD-10-CM | POA: Diagnosis not present

## 2014-03-13 DIAGNOSIS — E7800 Pure hypercholesterolemia, unspecified: Secondary | ICD-10-CM | POA: Diagnosis not present

## 2014-03-13 LAB — CBC WITH DIFFERENTIAL/PLATELET
Basophils Absolute: 0 10*3/uL (ref 0.0–0.1)
Basophils Relative: 0 % (ref 0–1)
Eosinophils Absolute: 0.2 10*3/uL (ref 0.0–0.7)
Eosinophils Relative: 4 % (ref 0–5)
HCT: 45.2 % (ref 39.0–52.0)
Hemoglobin: 14.9 g/dL (ref 13.0–17.0)
Lymphocytes Relative: 25 % (ref 12–46)
Lymphs Abs: 1.6 10*3/uL (ref 0.7–4.0)
MCH: 26.6 pg (ref 26.0–34.0)
MCHC: 33 g/dL (ref 30.0–36.0)
MCV: 80.7 fL (ref 78.0–100.0)
Monocytes Absolute: 0.7 10*3/uL (ref 0.1–1.0)
Monocytes Relative: 11 % (ref 3–12)
Neutro Abs: 3.8 10*3/uL (ref 1.7–7.7)
Neutrophils Relative %: 60 % (ref 43–77)
Platelets: 186 10*3/uL (ref 150–400)
RBC: 5.6 MIL/uL (ref 4.22–5.81)
RDW: 15.6 % — ABNORMAL HIGH (ref 11.5–15.5)
WBC: 6.3 10*3/uL (ref 4.0–10.5)

## 2014-03-13 LAB — COMPREHENSIVE METABOLIC PANEL
ALT: 44 U/L (ref 0–53)
AST: 47 U/L — ABNORMAL HIGH (ref 0–37)
Albumin: 4.2 g/dL (ref 3.5–5.2)
Alkaline Phosphatase: 114 U/L (ref 39–117)
Anion gap: 16 — ABNORMAL HIGH (ref 5–15)
BUN: 14 mg/dL (ref 6–23)
CO2: 23 mEq/L (ref 19–32)
Calcium: 9.9 mg/dL (ref 8.4–10.5)
Chloride: 100 mEq/L (ref 96–112)
Creatinine, Ser: 1 mg/dL (ref 0.50–1.35)
GFR calc Af Amer: >90 mL/min (ref >90)
GFR calc non Af Amer: 89 mL/min — ABNORMAL LOW (ref >90)
Glucose, Bld: 105 mg/dL — ABNORMAL HIGH (ref 70–99)
Potassium: 3.8 mEq/L (ref 3.7–5.3)
Sodium: 139 mEq/L (ref 137–147)
Total Bilirubin: 0.5 mg/dL (ref 0.3–1.2)
Total Protein: 8.7 g/dL — ABNORMAL HIGH (ref 6.0–8.3)

## 2014-03-13 LAB — CLOSTRIDIUM DIFFICILE BY PCR: Toxigenic C. Difficile by PCR: NEGATIVE

## 2014-03-13 LAB — LIPASE, BLOOD: Lipase: 24 U/L (ref 11–59)

## 2014-03-13 IMAGING — CR DG ABDOMEN ACUTE W/ 1V CHEST
3 series · 3 of 3 positions shown · non-contrast
Comparison: Chest x-ray, [DATE]

CLINICAL DATA: Abdominal pain, vomiting and diarrhea.

EXAM:
ACUTE ABDOMEN SERIES (ABDOMEN 2 VIEW & CHEST 1 VIEW)

[w chest pa]
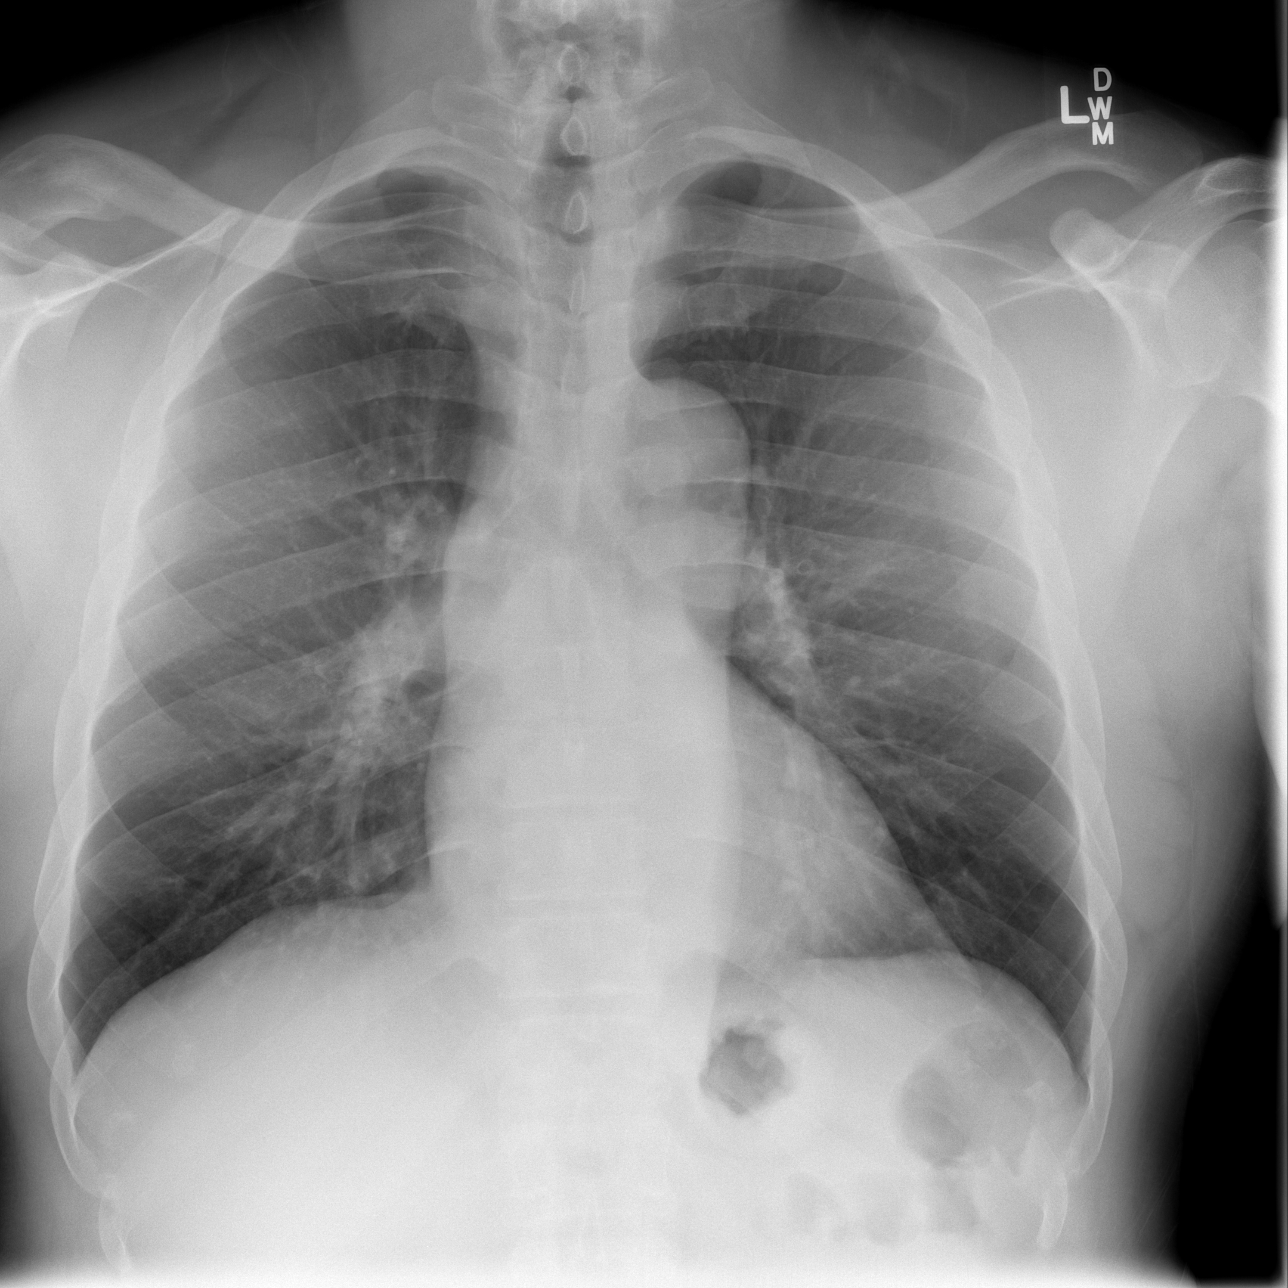

[w abdomen upright]
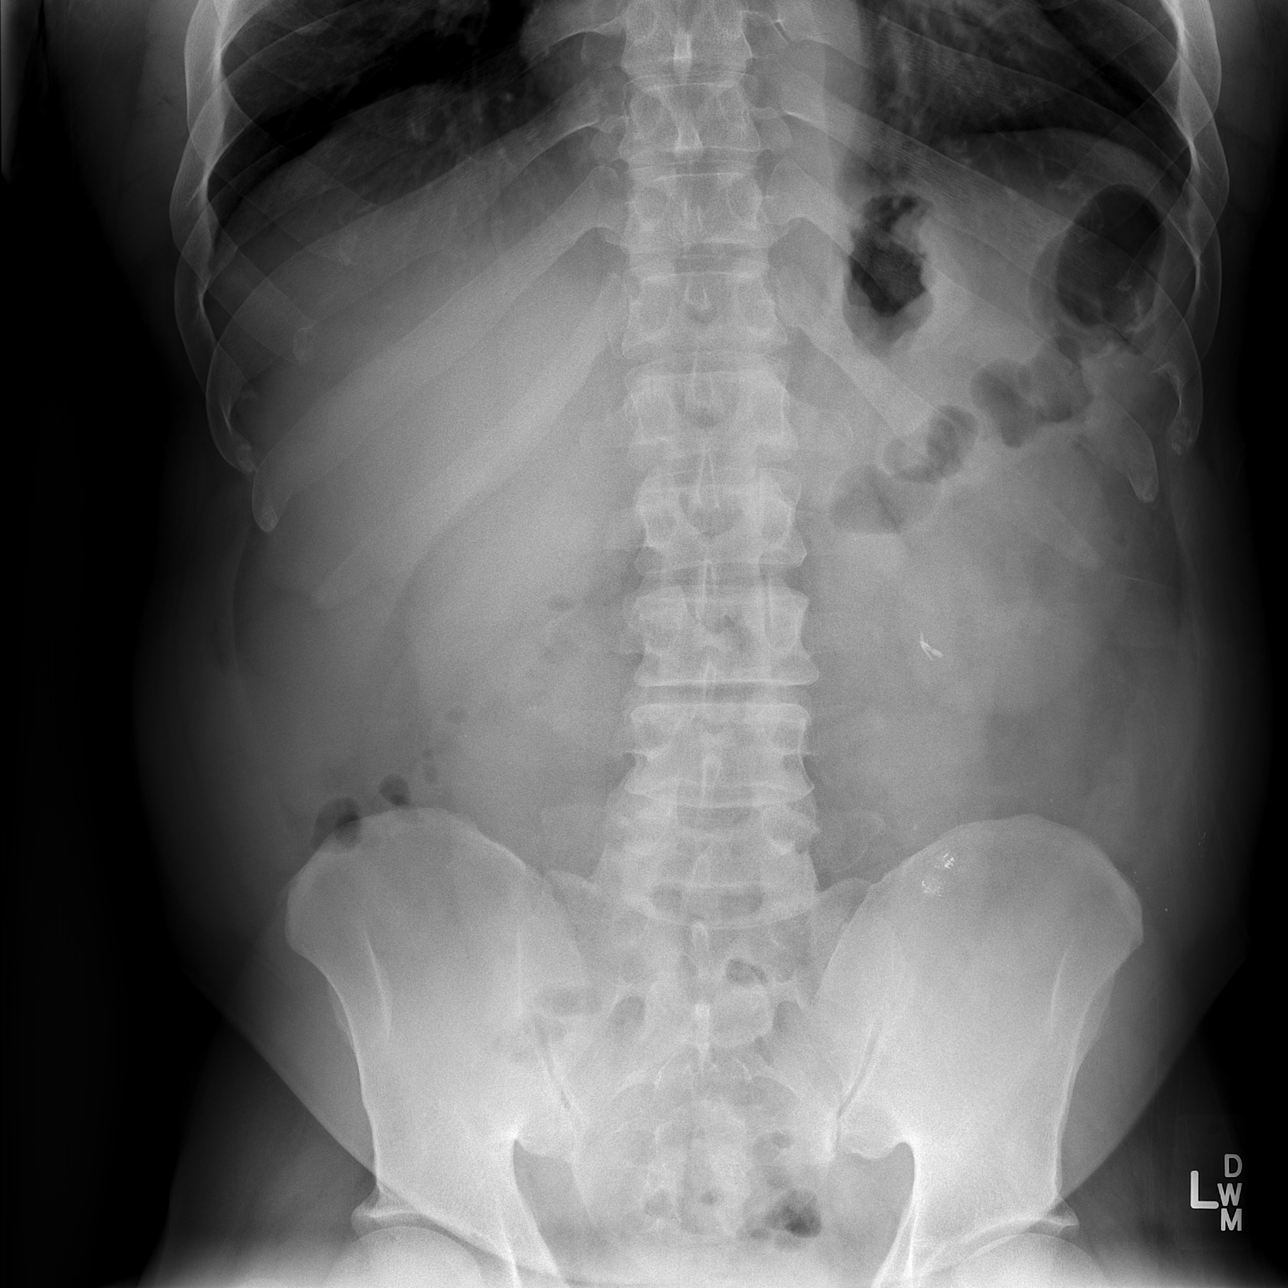

[t abdomen supine]
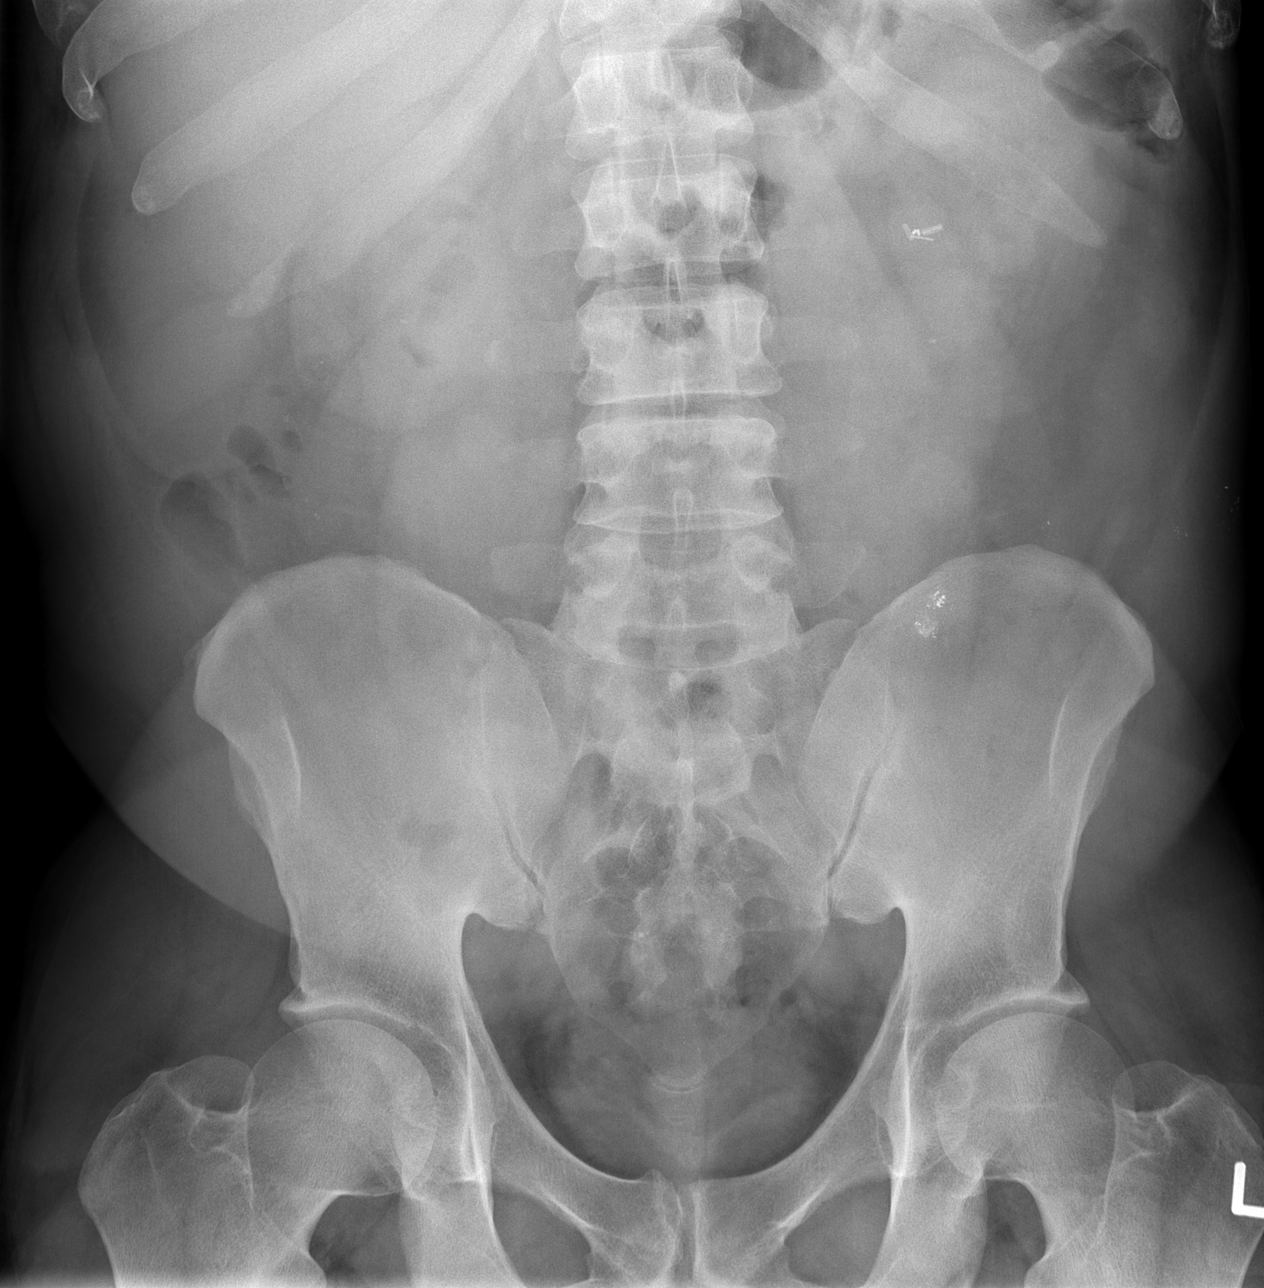

[3 of 3 positions shown; findings below may reference images not displayed]

FINDINGS: There is no evidence of pulmonary edema, consolidation,
pneumothorax, nodule or pleural fluid. The heart size is normal.

Abdominal films show no evidence of bowel obstruction or ileus. No
free air is identified. Clips are present in the left mid abdomen.
Calcific density overlying the region of the left iliac crest may be
within bowel or in soft tissues. This may be some retained barium.
Bony structures are unremarkable.
IMPRESSION: No evidence of bowel obstruction, ileus or free air.

## 2014-03-13 MED ORDER — GI COCKTAIL ~~LOC~~
30.0000 mL | Freq: Once | ORAL | Status: AC
  Administered 2014-03-13: 30 mL via ORAL
  Filled 2014-03-13: qty 30

## 2014-03-13 MED ORDER — ONDANSETRON HCL 8 MG PO TABS: 8.0000 mg | ORAL_TABLET | Freq: Three times a day (TID) | ORAL | Status: DC | PRN

## 2014-03-13 NOTE — Discharge Instructions (Signed)
Avoid milk or food containing milk such as cheese or ice cream while having diarrhea. Take the medication as prescribed as needed for nausea. Take Imodium as directed for diarrhea. See your doctor if you continue to have nausea vomiting or diarrhea by next week. Take your blood pressure medication as soon as you get home today. Your blood pressure today was elevated at 170/107. Get your blood pressure rechecked within a week
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
## 2014-03-13 NOTE — ED Notes (Signed)
Patient c/o nausea/vomiting and diarrhea since thursday

## 2014-10-16 ENCOUNTER — Emergency Department (HOSPITAL_BASED_OUTPATIENT_CLINIC_OR_DEPARTMENT_OTHER)
Admission: EM | Admit: 2014-10-16 | Discharge: 2014-10-16 | Disposition: A | Discharge status: Home / Self Care | Payer: No Typology Code available for payment source | Attending: Emergency Medicine | Admitting: Emergency Medicine

## 2014-10-16 ENCOUNTER — Encounter (HOSPITAL_BASED_OUTPATIENT_CLINIC_OR_DEPARTMENT_OTHER): Payer: Self-pay

## 2014-10-16 ENCOUNTER — Emergency Department (HOSPITAL_BASED_OUTPATIENT_CLINIC_OR_DEPARTMENT_OTHER): Payer: No Typology Code available for payment source

## 2014-10-16 DIAGNOSIS — Y92410 Unspecified street and highway as the place of occurrence of the external cause: Secondary | ICD-10-CM | POA: Insufficient documentation

## 2014-10-16 DIAGNOSIS — Z8719 Personal history of other diseases of the digestive system: Secondary | ICD-10-CM | POA: Insufficient documentation

## 2014-10-16 DIAGNOSIS — F329 Major depressive disorder, single episode, unspecified: Secondary | ICD-10-CM | POA: Diagnosis not present

## 2014-10-16 DIAGNOSIS — Y9389 Activity, other specified: Secondary | ICD-10-CM | POA: Insufficient documentation

## 2014-10-16 DIAGNOSIS — I10 Essential (primary) hypertension: Secondary | ICD-10-CM | POA: Insufficient documentation

## 2014-10-16 DIAGNOSIS — S161XXA Strain of muscle, fascia and tendon at neck level, initial encounter: Secondary | ICD-10-CM | POA: Diagnosis not present

## 2014-10-16 DIAGNOSIS — S39012A Strain of muscle, fascia and tendon of lower back, initial encounter: Secondary | ICD-10-CM

## 2014-10-16 DIAGNOSIS — S29012A Strain of muscle and tendon of back wall of thorax, initial encounter: Secondary | ICD-10-CM | POA: Insufficient documentation

## 2014-10-16 DIAGNOSIS — Y998 Other external cause status: Secondary | ICD-10-CM | POA: Insufficient documentation

## 2014-10-16 DIAGNOSIS — J45909 Unspecified asthma, uncomplicated: Secondary | ICD-10-CM | POA: Insufficient documentation

## 2014-10-16 DIAGNOSIS — E780 Pure hypercholesterolemia: Secondary | ICD-10-CM | POA: Diagnosis not present

## 2014-10-16 DIAGNOSIS — Z87891 Personal history of nicotine dependence: Secondary | ICD-10-CM | POA: Diagnosis not present

## 2014-10-16 DIAGNOSIS — S3992XA Unspecified injury of lower back, initial encounter: Secondary | ICD-10-CM | POA: Diagnosis present
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
## 2014-10-16 MED ORDER — HYDROCODONE-ACETAMINOPHEN 5-325 MG PO TABS: 1.0000 | ORAL_TABLET | ORAL | Status: DC | PRN

## 2014-10-16 MED ORDER — IBUPROFEN 800 MG PO TABS
800.0000 mg | ORAL_TABLET | Freq: Once | ORAL | Status: AC
  Administered 2014-10-16: 800 mg via ORAL
  Filled 2014-10-16: qty 1

## 2014-10-16 MED ORDER — CYCLOBENZAPRINE HCL 10 MG PO TABS: 10.0000 mg | ORAL_TABLET | Freq: Two times a day (BID) | ORAL | Status: DC | PRN
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
## 2014-10-16 NOTE — Discharge Instructions (Signed)
Back Pain, Adult °Low back pain is very common. About 1 in 5 people have back pain. The cause of low back pain is rarely dangerous. The pain often gets better over time. About half of people with a sudden onset of back pain feel better in just 2 weeks. About 8 in 10 people feel better by 6 weeks.  °CAUSES °Some common causes of back pain include: °· Strain of the muscles or ligaments supporting the spine. °· Wear and tear (degeneration) of the spinal discs. °· Arthritis. °· Direct injury to the back. °DIAGNOSIS °Most of the time, the direct cause of low back pain is not known. However, back pain can be treated effectively even when the exact cause of the pain is unknown. Answering your caregiver's questions about your overall health and symptoms is one of the most accurate ways to make sure the cause of your pain is not dangerous. If your caregiver needs more information, he or she may order lab work or imaging tests (X-rays or MRIs). However, even if imaging tests show changes in your back, this usually does not require surgery. °HOME CARE INSTRUCTIONS °For many people, back pain returns. Since low back pain is rarely dangerous, it is often a condition that people can learn to manage on their own.  °· Remain active. It is stressful on the back to sit or stand in one place. Do not sit, drive, or stand in one place for more than 30 minutes at a time. Take short walks on level surfaces as soon as pain allows. Try to increase the length of time you walk each day. °· Do not stay in bed. Resting more than 1 or 2 days can delay your recovery. °· Do not avoid exercise or work. Your body is made to move. It is not dangerous to be active, even though your back may hurt. Your back will likely heal faster if you return to being active before your pain is gone. °· Pay attention to your body when you  bend and lift. Many people have less discomfort when lifting if they bend their knees, keep the load close to their bodies, and  avoid twisting. Often, the most comfortable positions are those that put less stress on your recovering back. °· Find a comfortable position to sleep. Use a firm mattress and lie on your side with your knees slightly bent. If you lie on your back, put a pillow under your knees. °· Only take over-the-counter or prescription medicines as directed by your caregiver. Over-the-counter medicines to reduce pain and inflammation are often the most helpful. Your caregiver may prescribe muscle relaxant drugs. These medicines help dull your pain so you can more quickly return to your normal activities and healthy exercise. °· Put ice on the injured area. °· Put ice in a plastic bag. °· Place a towel between your skin and the bag. °· Leave the ice on for 15-20 minutes, 03-04 times a day for the first 2 to 3 days. After that, ice and heat may be alternated to reduce pain and spasms. °· Ask your caregiver about trying back exercises and gentle massage. This may be of some benefit. °· Avoid feeling anxious or stressed. Stress increases muscle tension and can worsen back pain. It is important to recognize when you are anxious or stressed and learn ways to manage it. Exercise is a great option. °SEEK MEDICAL CARE IF: °· You have pain that is not relieved with rest or medicine. °· You have pain that does not improve in 1 week. °· You have new symptoms. °· You are generally not feeling well. °SEEK   IMMEDIATE MEDICAL CARE IF:  °· You have pain that radiates from your back into your legs. °· You develop new bowel or bladder control problems. °· You have unusual weakness or numbness in your arms or legs. °· You develop nausea or vomiting. °· You develop abdominal pain. °· You feel faint. °Document Released: 06/25/2005 Document Revised: 12/25/2011 Document Reviewed: 10/27/2013 °Exit[employer]® Patient Information ©2015 [employer]. This information is not intended to replace advice given to you by your health care provider. Make sure you  discuss any questions you have with your health care provider. ° °Cervical Sprain °A cervical sprain is an injury in the neck in which the strong, fibrous tissues (ligaments) that connect your neck bones stretch or tear. Cervical sprains can range from mild to severe. Severe cervical sprains can cause the neck vertebrae to be unstable. This can lead to damage of the spinal cord and can result in serious nervous system problems. The amount of time it takes for a cervical sprain to get better depends on the cause and extent of the injury. Most cervical sprains heal in 1 to 3 weeks. °CAUSES  °Severe cervical sprains may be caused by:  °· Contact sport injuries (such as from football, rugby, wrestling, hockey, auto racing, gymnastics, diving, martial arts, or boxing).   °· Motor vehicle collisions.   °· Whiplash injuries. This is an injury from a sudden forward and backward whipping movement of the head and neck.  °· Falls.   °Mild cervical sprains may be caused by:  °· Being in an awkward position, such as while cradling a telephone between your ear and shoulder.   °· Sitting in a chair that does not offer proper support.   °· Working at a poorly designed computer station.   °· Looking up or down for long periods of time.   °SYMPTOMS  °· Pain, soreness, stiffness, or a burning sensation in the front, back, or sides of the neck. This discomfort may develop immediately after the injury or slowly, 24 hours or more after the injury.   °· Pain or tenderness directly in the middle of the back of the neck.   °· Shoulder or upper back pain.   °· Limited ability to move the neck.   °· Headache.   °· Dizziness.   °· Weakness, numbness, or tingling in the hands or arms.   °· Muscle spasms.   °· Difficulty swallowing or chewing.   °· Tenderness and swelling of the neck.   °DIAGNOSIS  °Most of the time your health care provider can diagnose a cervical sprain by taking your history and doing a physical exam. Your health care  provider will ask about previous neck injuries and any known neck problems, such as arthritis in the neck. X-rays may be taken to find out if there are any other problems, such as with the bones of the neck. Other tests, such as a CT scan or MRI, may also be needed.  °TREATMENT  °Treatment depends on the severity of the cervical sprain. Mild sprains can be treated with rest, keeping the neck in place (immobilization), and pain medicines. Severe cervical sprains are immediately immobilized. Further treatment is done to help with pain, muscle spasms, and other symptoms and may include: °· Medicines, such as pain relievers, numbing medicines, or muscle relaxants.   °· Physical therapy. This may involve stretching exercises, strengthening exercises, and posture training. Exercises and improved posture can help stabilize the neck, strengthen muscles, and help stop symptoms from returning.   °HOME CARE INSTRUCTIONS  °· Put ice on the injured area.   °¨ Put ice in a   plastic bag.   °¨ Place a towel between your skin and the bag.   °¨ Leave the ice on for 15-20 minutes, 3-4 times a day.   °· If your injury was severe, you may have been given a cervical collar to wear. A cervical collar is a two-piece collar designed to keep your neck from moving while it heals. °¨ Do not remove the collar unless instructed by your health care provider. °¨ If you have long hair, keep it outside of the collar. °¨ Ask your health care provider before making any adjustments to your collar. Minor adjustments may be required over time to improve comfort and reduce pressure on your chin or on the back of your head. °¨ If you are allowed to remove the collar for cleaning or bathing, follow your health care provider's instructions on how to do so safely. °¨ Keep your collar clean by wiping it with mild soap and water and drying it completely. If the collar you have been given includes removable pads, remove them every 1-2 days and hand wash them with  soap and water. Allow them to air dry. They should be completely dry before you wear them in the collar. °¨ If you are allowed to remove the collar for cleaning and bathing, wash and dry the skin of your neck. Check your skin for irritation or sores. If you see any, tell your health care provider. °¨ Do not drive while wearing the collar.   °· Only take over-the-counter or prescription medicines for pain, discomfort, or fever as directed by your health care provider.   °· Keep all follow-up appointments as directed by your health care provider.   °· Keep all physical therapy appointments as directed by your health care provider.   °· Make any needed adjustments to your workstation to promote good posture.   °· Avoid positions and activities that make your symptoms worse.   °· Warm up and stretch before being active to help prevent problems.   °SEEK MEDICAL CARE IF:  °· Your pain is not controlled with medicine.   °· You are unable to decrease your pain medicine over time as planned.   °· Your activity level is not improving as expected.   °SEEK IMMEDIATE MEDICAL CARE IF:  °· You develop any bleeding. °· You develop stomach upset. °· You have signs of an allergic reaction to your medicine.   °· Your symptoms get worse.   °· You develop new, unexplained symptoms.   °· You have numbness, tingling, weakness, or paralysis in any part of your body.   °MAKE SURE YOU:  °· Understand these instructions. °· Will watch your condition. °· Will get help right away if you are not doing well or get worse. °Document Released: 04/22/2007 Document Revised: 06/30/2013 Document Reviewed: 12/31/2012 °ExitCare® Patient Information ©2015 [employer]. This information is not intended to replace advice given to you by your health care provider. Make sure you discuss any questions you have with your health care provider. ° °
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
## 2014-10-16 NOTE — ED Notes (Signed)
D/c home with rx x 2 for flexeril and norco

## 2014-11-27 ENCOUNTER — Emergency Department (HOSPITAL_BASED_OUTPATIENT_CLINIC_OR_DEPARTMENT_OTHER): Payer: Medicaid Other

## 2014-11-27 ENCOUNTER — Encounter (HOSPITAL_BASED_OUTPATIENT_CLINIC_OR_DEPARTMENT_OTHER): Payer: Self-pay | Admitting: *Deleted

## 2014-11-27 ENCOUNTER — Emergency Department (HOSPITAL_BASED_OUTPATIENT_CLINIC_OR_DEPARTMENT_OTHER)
Admission: EM | Admit: 2014-11-27 | Discharge: 2014-11-27 | Disposition: A | Discharge status: Home / Self Care | Payer: Medicaid Other | Attending: Emergency Medicine | Admitting: Emergency Medicine

## 2014-11-27 DIAGNOSIS — Z8719 Personal history of other diseases of the digestive system: Secondary | ICD-10-CM | POA: Diagnosis not present

## 2014-11-27 DIAGNOSIS — Z7951 Long term (current) use of inhaled steroids: Secondary | ICD-10-CM | POA: Diagnosis not present

## 2014-11-27 DIAGNOSIS — J45901 Unspecified asthma with (acute) exacerbation: Secondary | ICD-10-CM

## 2014-11-27 DIAGNOSIS — Z792 Long term (current) use of antibiotics: Secondary | ICD-10-CM | POA: Diagnosis not present

## 2014-11-27 DIAGNOSIS — E780 Pure hypercholesterolemia: Secondary | ICD-10-CM | POA: Diagnosis not present

## 2014-11-27 DIAGNOSIS — Z79899 Other long term (current) drug therapy: Secondary | ICD-10-CM | POA: Diagnosis not present

## 2014-11-27 DIAGNOSIS — Z87891 Personal history of nicotine dependence: Secondary | ICD-10-CM | POA: Insufficient documentation

## 2014-11-27 DIAGNOSIS — Z8659 Personal history of other mental and behavioral disorders: Secondary | ICD-10-CM | POA: Insufficient documentation

## 2014-11-27 DIAGNOSIS — R0602 Shortness of breath: Secondary | ICD-10-CM | POA: Diagnosis present

## 2014-11-27 DIAGNOSIS — I10 Essential (primary) hypertension: Secondary | ICD-10-CM | POA: Insufficient documentation

## 2014-11-27 IMAGING — DX DG CHEST 2V
2 series · 2 of 2 positions shown · non-contrast
Comparison: Prior chest x-ray [DATE]

CLINICAL DATA: 48-year-old male with shortness of breath and
productive cough

EXAM:
CHEST  2 VIEW

[chest pa]
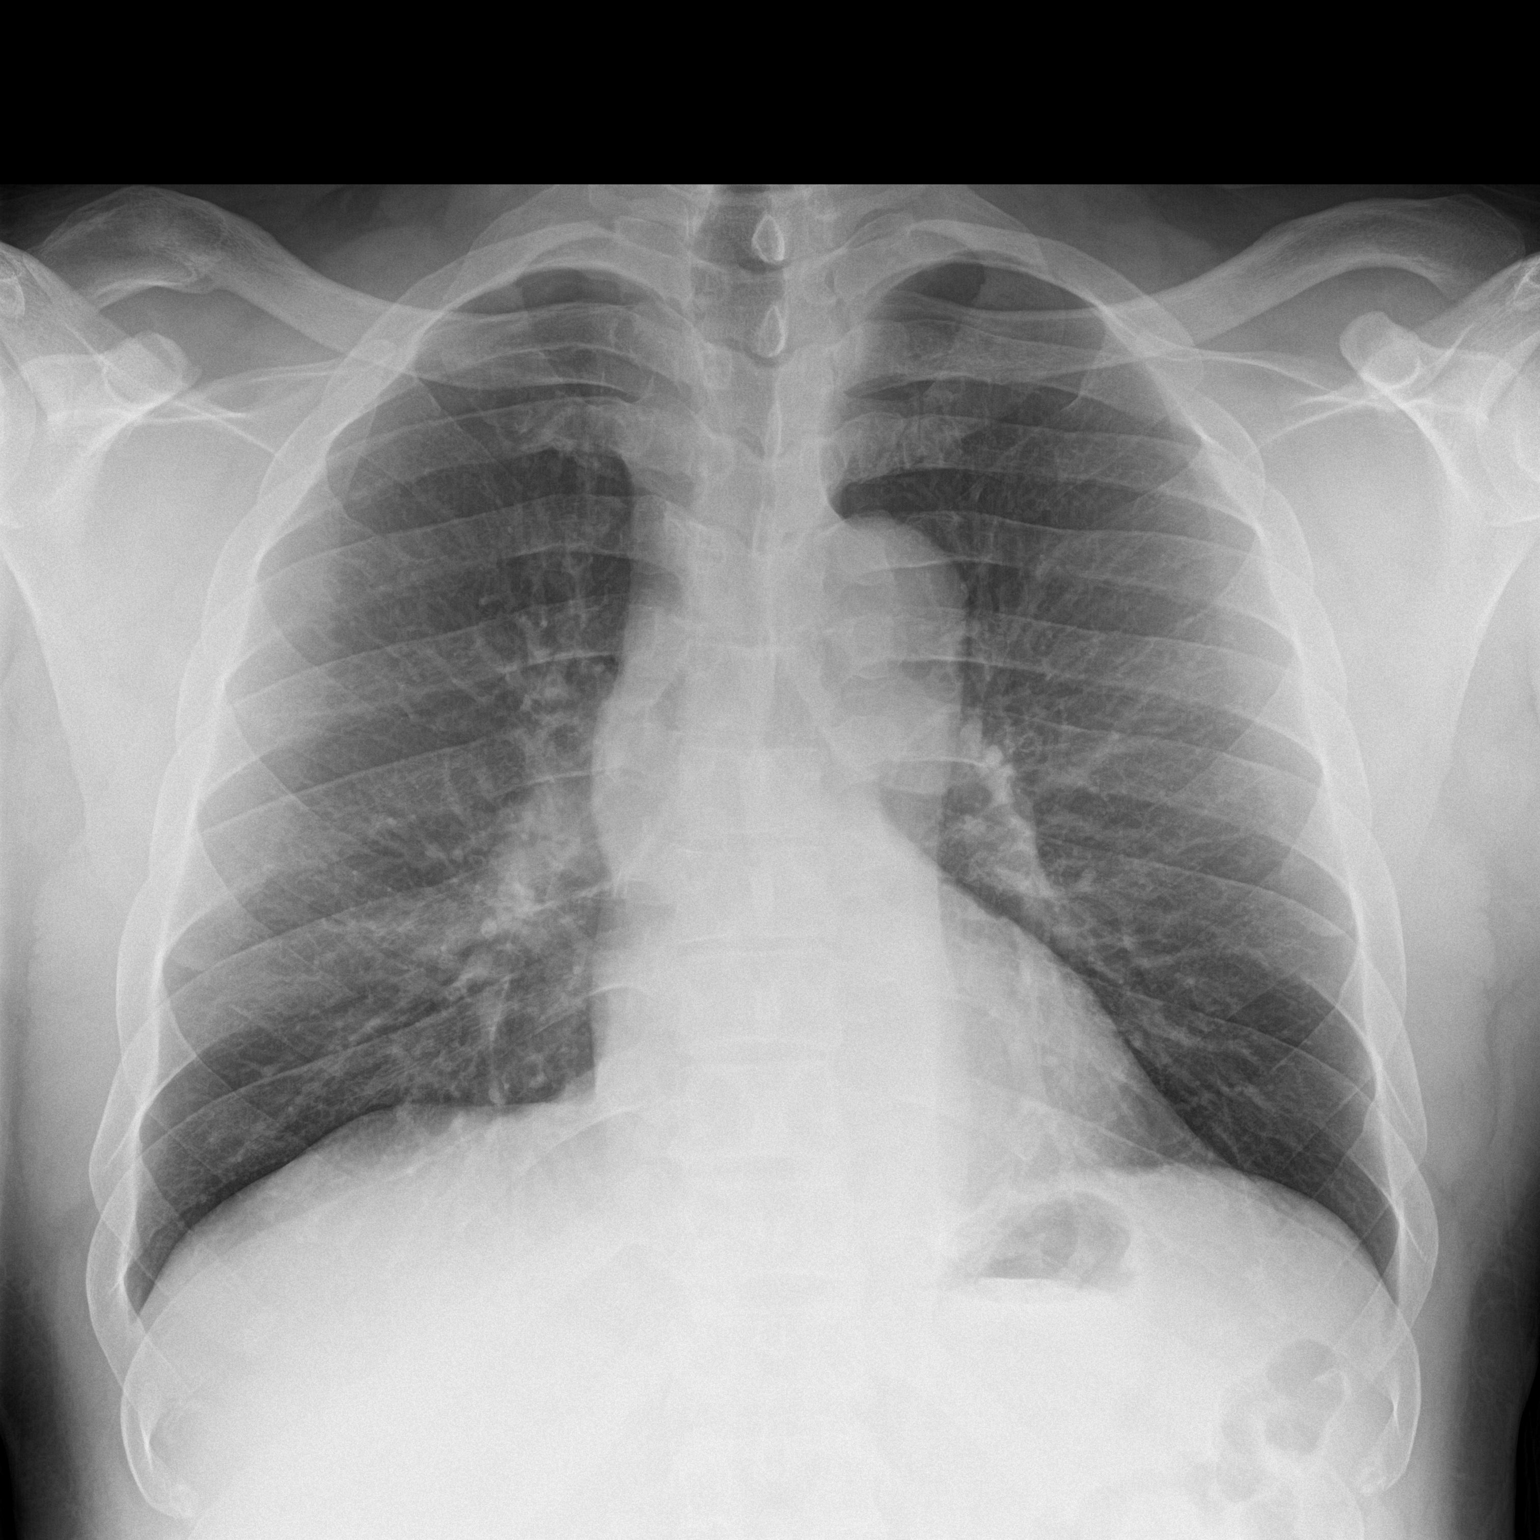

[chest lat]
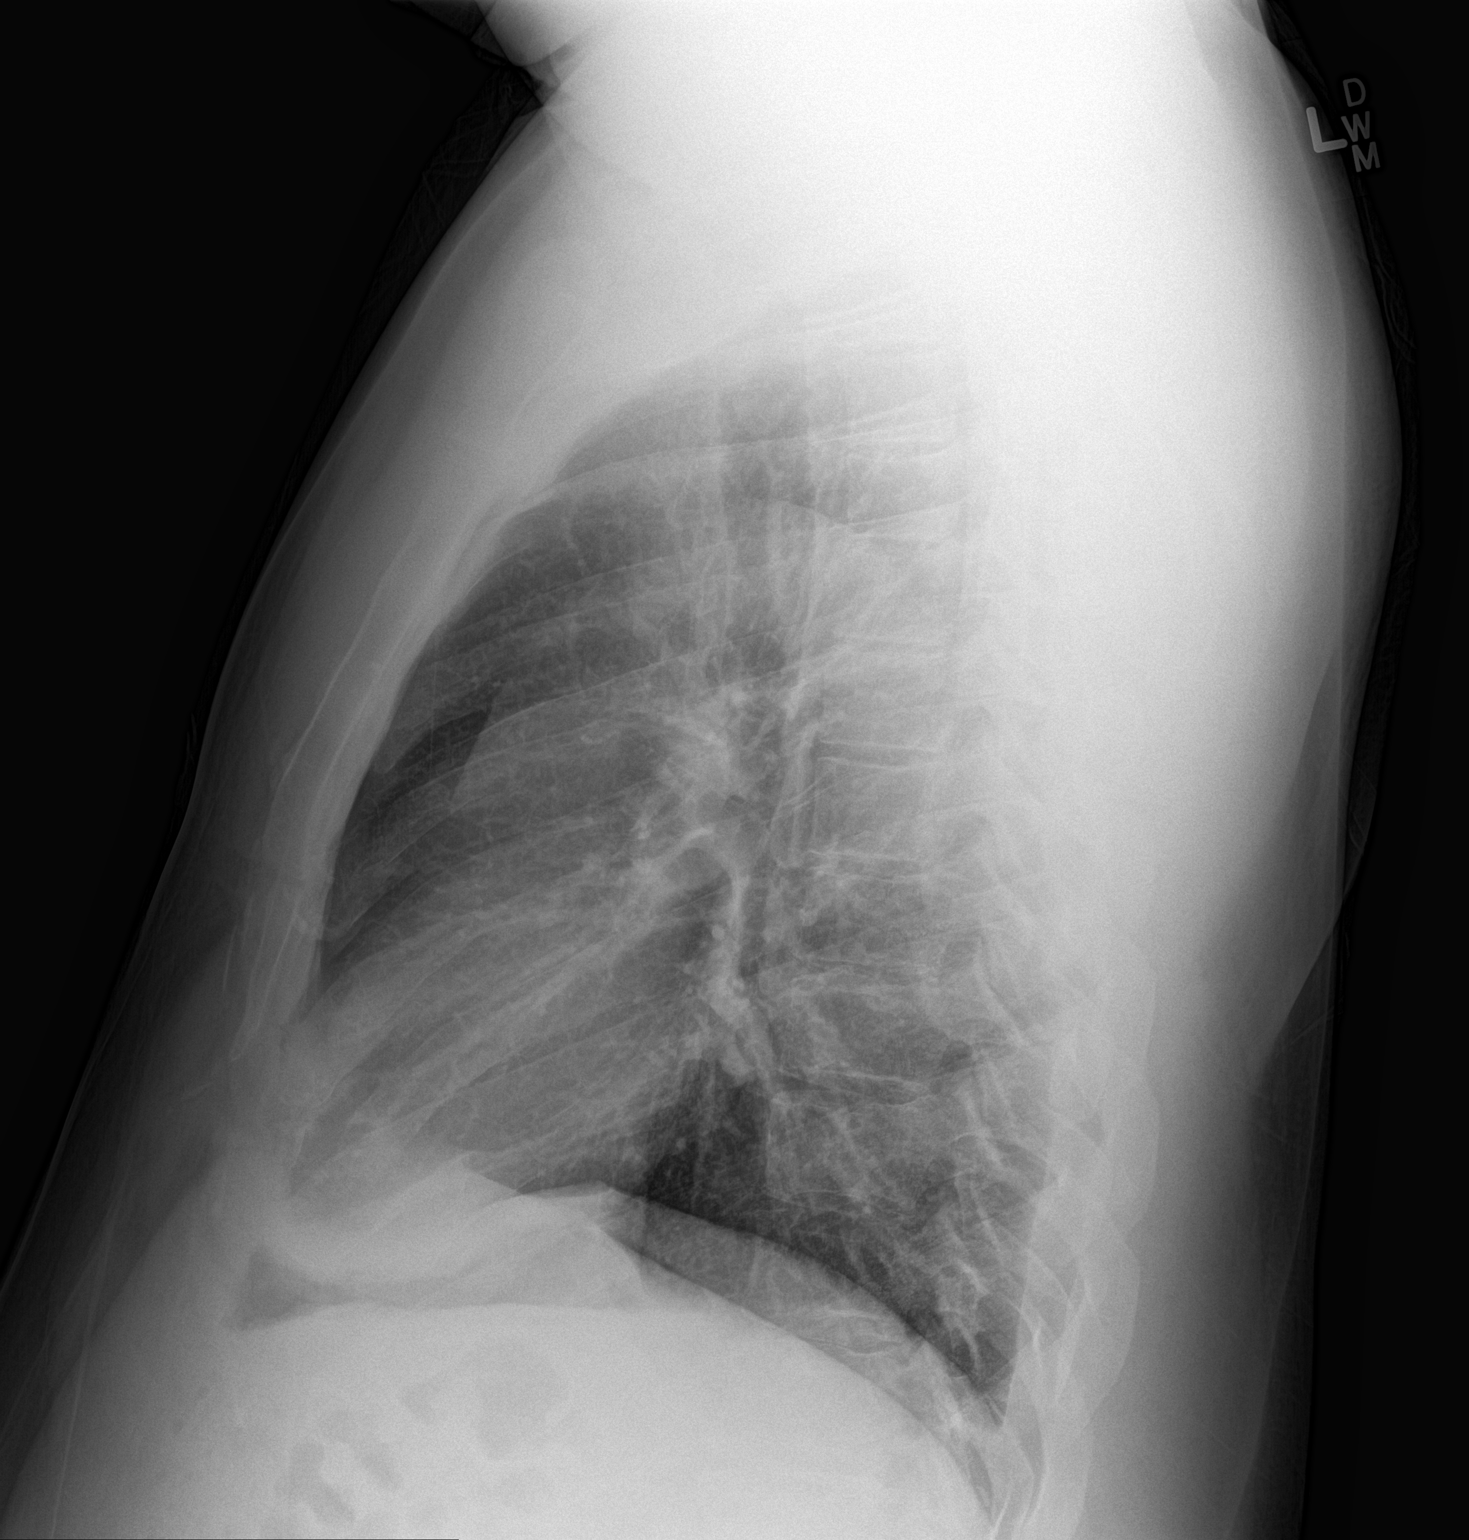

[2 of 2 positions shown; findings below may reference images not displayed]

FINDINGS: Cardiac and mediastinal contours are within normal limits. The lungs
are clear. No pleural effusion or pneumothorax. No suspicious
pulmonary mass or nodule. No acute osseous abnormality.
IMPRESSION: No acute cardiopulmonary process.

## 2014-11-27 MED ORDER — ALBUTEROL SULFATE (2.5 MG/3ML) 0.083% IN NEBU
INHALATION_SOLUTION | RESPIRATORY_TRACT | Status: AC
  Administered 2014-11-27: 2.5 mg
  Filled 2014-11-27: qty 3

## 2014-11-27 MED ORDER — PREDNISONE 50 MG PO TABS
60.0000 mg | ORAL_TABLET | Freq: Once | ORAL | Status: AC
  Administered 2014-11-27: 60 mg via ORAL
  Filled 2014-11-27 (×2): qty 1

## 2014-11-27 MED ORDER — PREDNISONE 20 MG PO TABS: ORAL_TABLET | ORAL | Status: DC

## 2014-11-27 MED ORDER — IPRATROPIUM-ALBUTEROL 0.5-2.5 (3) MG/3ML IN SOLN
RESPIRATORY_TRACT | Status: AC
  Administered 2014-11-27: 3 mL
  Filled 2014-11-27: qty 3

## 2014-11-27 NOTE — Discharge Instructions (Signed)
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
## 2014-11-27 NOTE — ED Notes (Signed)
rtns from radiology, placed on cont POX

## 2014-11-27 NOTE — ED Notes (Signed)
Pt rec Neb HHN tx by RT staff, [facility]

## 2014-11-27 NOTE — ED Notes (Signed)
Patient transported to X-ray 

## 2014-11-27 NOTE — ED Notes (Signed)
MD at bedside. 

## 2014-11-27 NOTE — ED Notes (Signed)
Patient c/o sob, productive cough for the past few days, green sputum, used albuterol inhaler this morning

## 2015-01-01 ENCOUNTER — Encounter (HOSPITAL_BASED_OUTPATIENT_CLINIC_OR_DEPARTMENT_OTHER): Payer: Self-pay | Admitting: *Deleted

## 2015-01-01 ENCOUNTER — Emergency Department (HOSPITAL_BASED_OUTPATIENT_CLINIC_OR_DEPARTMENT_OTHER)
Admission: EM | Admit: 2015-01-01 | Discharge: 2015-01-01 | Disposition: A | Discharge status: Home / Self Care | Payer: Medicaid Other | Attending: Emergency Medicine | Admitting: Emergency Medicine

## 2015-01-01 DIAGNOSIS — Z8659 Personal history of other mental and behavioral disorders: Secondary | ICD-10-CM | POA: Insufficient documentation

## 2015-01-01 DIAGNOSIS — Z8719 Personal history of other diseases of the digestive system: Secondary | ICD-10-CM | POA: Diagnosis not present

## 2015-01-01 DIAGNOSIS — M778 Other enthesopathies, not elsewhere classified: Secondary | ICD-10-CM | POA: Insufficient documentation

## 2015-01-01 DIAGNOSIS — E780 Pure hypercholesterolemia: Secondary | ICD-10-CM | POA: Diagnosis not present

## 2015-01-01 DIAGNOSIS — Z87891 Personal history of nicotine dependence: Secondary | ICD-10-CM | POA: Diagnosis not present

## 2015-01-01 DIAGNOSIS — Z79899 Other long term (current) drug therapy: Secondary | ICD-10-CM | POA: Diagnosis not present

## 2015-01-01 DIAGNOSIS — Y9389 Activity, other specified: Secondary | ICD-10-CM | POA: Insufficient documentation

## 2015-01-01 DIAGNOSIS — I10 Essential (primary) hypertension: Secondary | ICD-10-CM | POA: Insufficient documentation

## 2015-01-01 DIAGNOSIS — Y9289 Other specified places as the place of occurrence of the external cause: Secondary | ICD-10-CM | POA: Diagnosis not present

## 2015-01-01 DIAGNOSIS — X58XXXA Exposure to other specified factors, initial encounter: Secondary | ICD-10-CM | POA: Diagnosis not present

## 2015-01-01 DIAGNOSIS — J45909 Unspecified asthma, uncomplicated: Secondary | ICD-10-CM | POA: Diagnosis not present

## 2015-01-01 DIAGNOSIS — S199XXA Unspecified injury of neck, initial encounter: Secondary | ICD-10-CM | POA: Diagnosis present

## 2015-01-01 DIAGNOSIS — S161XXA Strain of muscle, fascia and tendon at neck level, initial encounter: Secondary | ICD-10-CM | POA: Diagnosis not present

## 2015-01-01 DIAGNOSIS — Z7952 Long term (current) use of systemic steroids: Secondary | ICD-10-CM | POA: Diagnosis not present

## 2015-01-01 DIAGNOSIS — Z792 Long term (current) use of antibiotics: Secondary | ICD-10-CM | POA: Insufficient documentation

## 2015-01-01 DIAGNOSIS — T148XXA Other injury of unspecified body region, initial encounter: Secondary | ICD-10-CM

## 2015-01-01 DIAGNOSIS — Y998 Other external cause status: Secondary | ICD-10-CM | POA: Insufficient documentation

## 2015-01-01 MED ORDER — PREDNISONE 20 MG PO TABS: ORAL_TABLET | ORAL | Status: DC

## 2015-01-01 MED ORDER — CYCLOBENZAPRINE HCL 10 MG PO TABS: 10.0000 mg | ORAL_TABLET | Freq: Two times a day (BID) | ORAL | Status: DC | PRN
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
## 2015-01-01 NOTE — ED Notes (Addendum)
Pt states using "icy-hot" to help relieve neck pain. Also c/o rt wrist pain

## 2015-01-01 NOTE — ED Notes (Signed)
MD at bedside. 

## 2015-01-01 NOTE — ED Notes (Signed)
Presents with neck pain, states just awoke with neck pain, approx 2 weeks ago.

## 2015-01-01 NOTE — Discharge Instructions (Signed)
Tendinitis Tendinitis is swelling and inflammation of the tendons. Tendons are band-like tissues that connect muscle to bone. Tendinitis commonly occurs in the:   Shoulders (rotator cuff).  Heels (Achilles tendon).  Elbows (triceps tendon). CAUSES Tendinitis is usually caused by overusing the tendon, muscles, and joints involved. When the tissue surrounding a tendon (synovium) becomes inflamed, it is called tenosynovitis. Tendinitis commonly develops in people whose jobs require repetitive motions. SYMPTOMS  Pain.  Tenderness.  Mild swelling. DIAGNOSIS Tendinitis is usually diagnosed by physical exam. Your health care provider may also order X-rays or other imaging tests. TREATMENT Your health care provider may recommend certain medicines or exercises for your treatment. HOME CARE INSTRUCTIONS   Use a sling or splint for as long as directed by your health care provider until the pain decreases.  Put ice on the injured area.  Put ice in a plastic bag.  Place a towel between your skin and the bag.  Leave the ice on for 15-20 minutes, 3-4 times a day, or as directed by your health care provider.  Avoid using the limb while the tendon is painful. Perform gentle range of motion exercises only as directed by your health care provider. Stop exercises if pain or discomfort increase, unless directed otherwise by your health care provider.  Only take over-the-counter or prescription medicines for pain, discomfort, or fever as directed by your health care provider. SEEK MEDICAL CARE IF:   Your pain and swelling increase.  You develop new, unexplained symptoms, especially increased numbness in the hands. MAKE SURE YOU:   Understand these instructions.  Will watch your condition.  Will get help right away if you are not doing well or get worse. Document Released: 06/22/2000 Document Revised: 11/09/2013 Document Reviewed: 09/11/2010 [facility] Patient Information 2015 [place],  [place]. This information is not intended to replace advice given to you by your health care provider. Make sure you discuss any questions you have with your health care provider.  Cervical Sprain A cervical sprain is an injury in the neck in which the strong, fibrous tissues (ligaments) that connect your neck bones stretch or tear. Cervical sprains can range from mild to severe. Severe cervical sprains can cause the neck vertebrae to be unstable. This can lead to damage of the spinal cord and can result in serious nervous system problems. The amount of time it takes for a cervical sprain to get better depends on the cause and extent of the injury. Most cervical sprains heal in 1 to 3 weeks. CAUSES  Severe cervical sprains may be caused by:   Contact sport injuries (such as from football, rugby, wrestling, hockey, auto racing, gymnastics, diving, martial arts, or boxing).   Motor vehicle collisions.   Whiplash injuries. This is an injury from a sudden forward and backward whipping movement of the head and neck.  Falls.  Mild cervical sprains may be caused by:   Being in an awkward position, such as while cradling a telephone between your ear and shoulder.   Sitting in a chair that does not offer proper support.   Working at a poorly Marketing executive station.   Looking up or down for long periods of time.  SYMPTOMS   Pain, soreness, stiffness, or a burning sensation in the front, back, or sides of the neck. This discomfort may develop immediately after the injury or slowly, 24 hours or more after the injury.   Pain or tenderness directly in the middle of the back of the neck.   Shoulder  or upper back pain.   Limited ability to move the neck.   Headache.   Dizziness.   Weakness, numbness, or tingling in the hands or arms.   Muscle spasms.   Difficulty swallowing or chewing.   Tenderness and swelling of the neck.  DIAGNOSIS  Most of the time your health care  provider can diagnose a cervical sprain by taking your history and doing a physical exam. Your health care provider will ask about previous neck injuries and any known neck problems, such as arthritis in the neck. X-rays may be taken to find out if there are any other problems, such as with the bones of the neck. Other tests, such as a CT scan or MRI, may also be needed.  TREATMENT  Treatment depends on the severity of the cervical sprain. Mild sprains can be treated with rest, keeping the neck in place (immobilization), and pain medicines. Severe cervical sprains are immediately immobilized. Further treatment is done to help with pain, muscle spasms, and other symptoms and may include:  Medicines, such as pain relievers, numbing medicines, or muscle relaxants.   Physical therapy. This may involve stretching exercises, strengthening exercises, and posture training. Exercises and improved posture can help stabilize the neck, strengthen muscles, and help stop symptoms from returning.  HOME CARE INSTRUCTIONS   Put ice on the injured area.   Put ice in a plastic bag.   Place a towel between your skin and the bag.   Leave the ice on for 15-20 minutes, 3-4 times a day.   If your injury was severe, you may have been given a cervical collar to wear. A cervical collar is a two-piece collar designed to keep your neck from moving while it heals.  Do not remove the collar unless instructed by your health care provider.  If you have long hair, keep it outside of the collar.  Ask your health care provider before making any adjustments to your collar. Minor adjustments may be required over time to improve comfort and reduce pressure on your chin or on the back of your head.  Ifyou are allowed to remove the collar for cleaning or bathing, follow your health care provider's instructions on how to do so safely.  Keep your collar clean by wiping it with mild soap and water and drying it completely. If  the collar you have been given includes removable pads, remove them every 1-2 days and hand wash them with soap and water. Allow them to air dry. They should be completely dry before you wear them in the collar.  If you are allowed to remove the collar for cleaning and bathing, wash and dry the skin of your neck. Check your skin for irritation or sores. If you see any, tell your health care provider.  Do not drive while wearing the collar.   Only take over-the-counter or prescription medicines for pain, discomfort, or fever as directed by your health care provider.   Keep all follow-up appointments as directed by your health care provider.   Keep all physical therapy appointments as directed by your health care provider.   Make any needed adjustments to your workstation to promote good posture.   Avoid positions and activities that make your symptoms worse.   Warm up and stretch before being active to help prevent problems.  SEEK MEDICAL CARE IF:   Your pain is not controlled with medicine.   You are unable to decrease your pain medicine over time as planned.  Your activity level is not improving as expected.  SEEK IMMEDIATE MEDICAL CARE IF:   You develop any bleeding.  You develop stomach upset.  You have signs of an allergic reaction to your medicine.   Your symptoms get worse.   You develop new, unexplained symptoms.   You have numbness, tingling, weakness, or paralysis in any part of your body.  MAKE SURE YOU:   Understand these instructions.  Will watch your condition.  Will get help right away if you are not doing well or get worse. Document Released: 04/22/2007 Document Revised: 06/30/2013 Document Reviewed: 12/31/2012 [facility] Patient Information 2015 [place], [place]. This information is not intended to replace advice given to you by your health care provider. Make sure you discuss any questions you have with your health care provider.

## 2015-01-01 NOTE — ED Notes (Signed)
Wrist splint applied to rt wrist per EDP orders, pt teaching done

## 2015-01-21 ENCOUNTER — Emergency Department (HOSPITAL_BASED_OUTPATIENT_CLINIC_OR_DEPARTMENT_OTHER)
Admission: EM | Admit: 2015-01-21 | Discharge: 2015-01-21 | Disposition: A | Discharge status: Home / Self Care | Payer: Medicaid Other | Attending: Emergency Medicine | Admitting: Emergency Medicine

## 2015-01-21 ENCOUNTER — Encounter (HOSPITAL_BASED_OUTPATIENT_CLINIC_OR_DEPARTMENT_OTHER): Payer: Self-pay | Admitting: Emergency Medicine

## 2015-01-21 ENCOUNTER — Emergency Department (HOSPITAL_BASED_OUTPATIENT_CLINIC_OR_DEPARTMENT_OTHER): Payer: Medicaid Other

## 2015-01-21 DIAGNOSIS — E780 Pure hypercholesterolemia: Secondary | ICD-10-CM | POA: Insufficient documentation

## 2015-01-21 DIAGNOSIS — I10 Essential (primary) hypertension: Secondary | ICD-10-CM | POA: Diagnosis not present

## 2015-01-21 DIAGNOSIS — J45909 Unspecified asthma, uncomplicated: Secondary | ICD-10-CM | POA: Diagnosis not present

## 2015-01-21 DIAGNOSIS — Z7951 Long term (current) use of inhaled steroids: Secondary | ICD-10-CM | POA: Insufficient documentation

## 2015-01-21 DIAGNOSIS — M79675 Pain in left toe(s): Secondary | ICD-10-CM | POA: Diagnosis present

## 2015-01-21 DIAGNOSIS — Z7952 Long term (current) use of systemic steroids: Secondary | ICD-10-CM | POA: Diagnosis not present

## 2015-01-21 DIAGNOSIS — Z8659 Personal history of other mental and behavioral disorders: Secondary | ICD-10-CM | POA: Insufficient documentation

## 2015-01-21 DIAGNOSIS — Z87891 Personal history of nicotine dependence: Secondary | ICD-10-CM | POA: Insufficient documentation

## 2015-01-21 DIAGNOSIS — Z79899 Other long term (current) drug therapy: Secondary | ICD-10-CM | POA: Diagnosis not present

## 2015-01-21 DIAGNOSIS — M109 Gout, unspecified: Secondary | ICD-10-CM | POA: Diagnosis not present

## 2015-01-21 DIAGNOSIS — Z8719 Personal history of other diseases of the digestive system: Secondary | ICD-10-CM | POA: Insufficient documentation

## 2015-01-21 HISTORY — DX: Type 2 diabetes mellitus without complications: E11.9

## 2015-01-21 LAB — CBG MONITORING, ED: Glucose-Capillary: 125 mg/dL — ABNORMAL HIGH (ref 65–99)

## 2015-01-21 IMAGING — DX DG FOOT COMPLETE 3+V*L*
3 series · 3 of 3 positions shown · non-contrast
Comparison: None.

CLINICAL DATA: LEFT great toe pain. Medial foot pain after wearing
tight socks. Redness and pain with ambulation.

EXAM:
LEFT FOOT - COMPLETE 3+ VIEW

[foot ap]
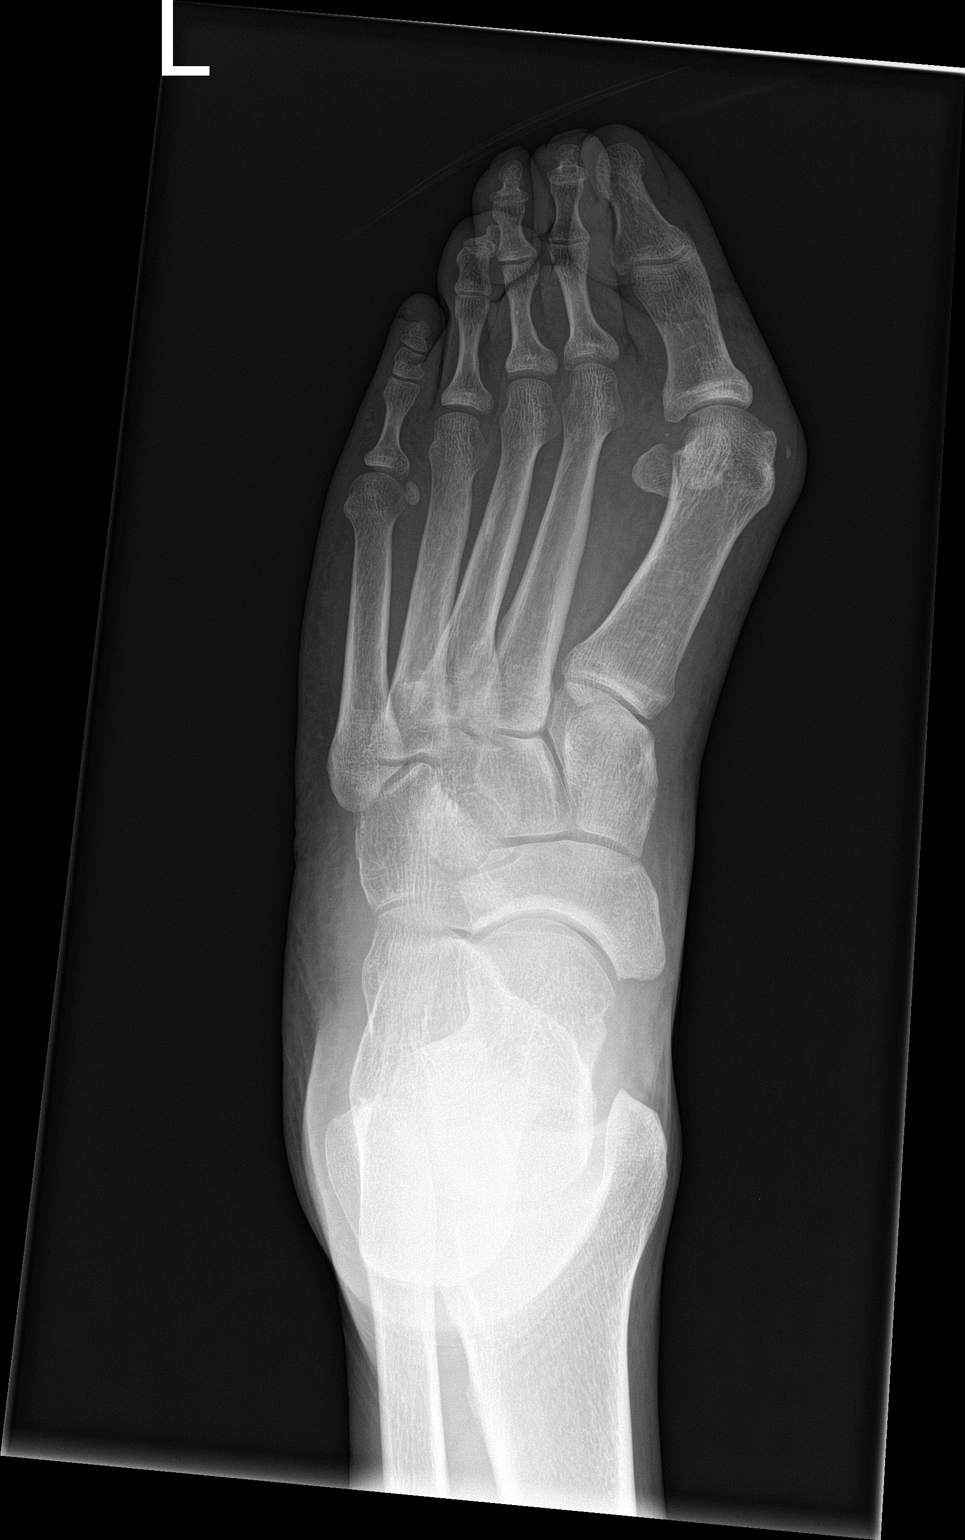

[foot obl]
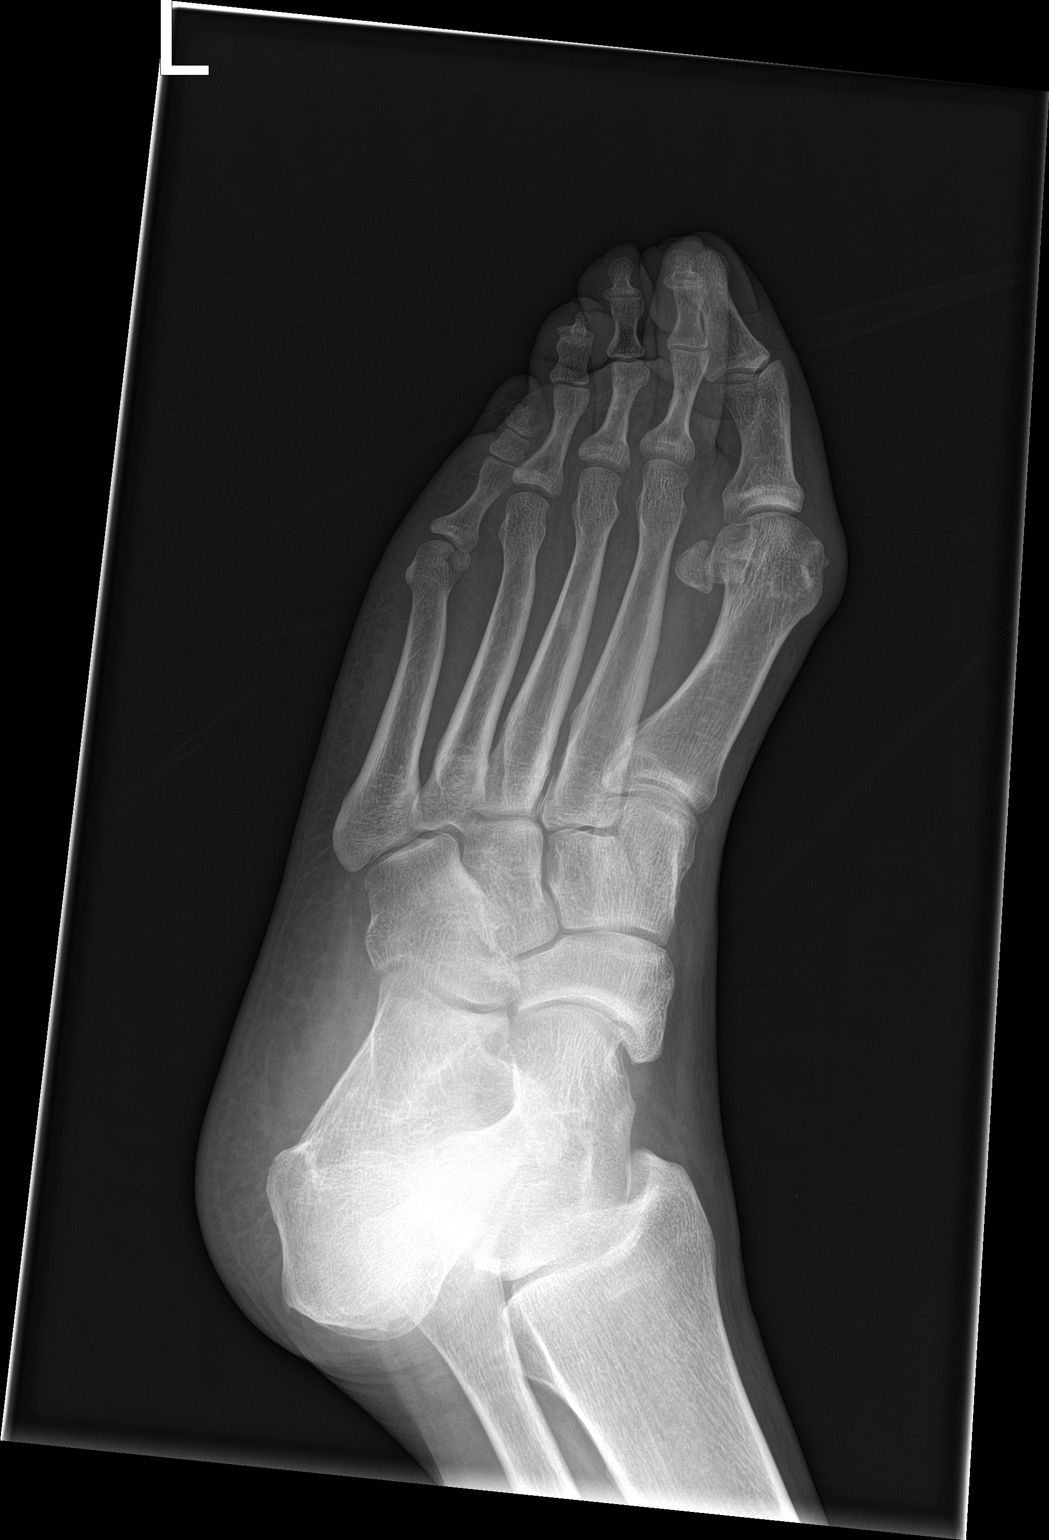

[foot lat]
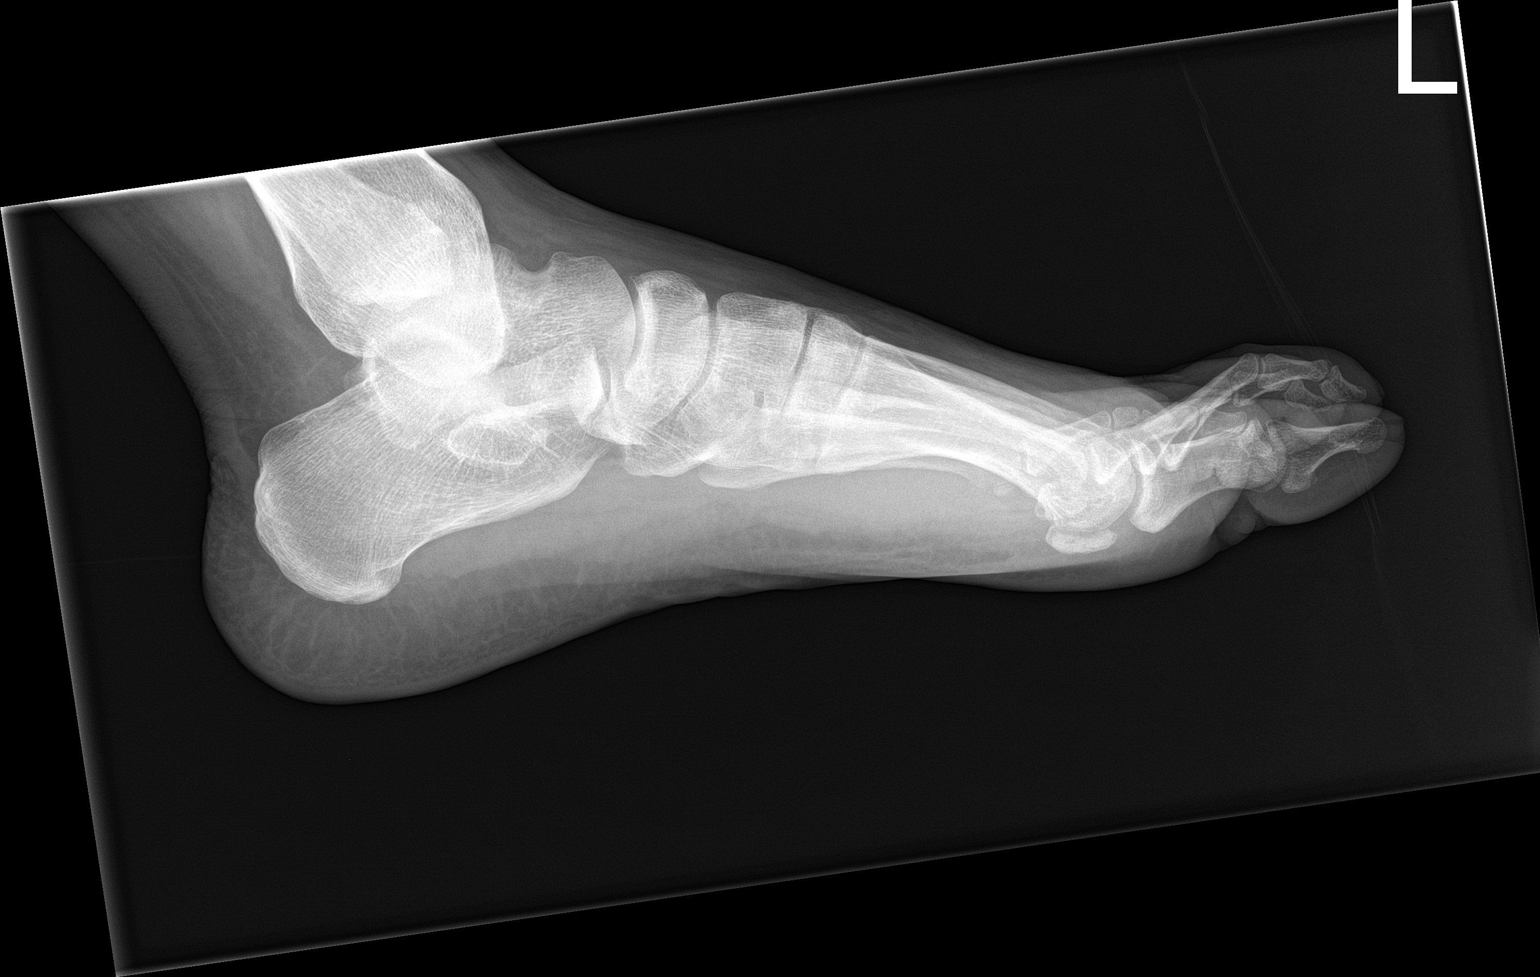

[3 of 3 positions shown; findings below may reference images not displayed]

FINDINGS: Hallux valgus is present. Dystrophic calcifications are present
around the bunion deformity. The third and fourth toes are
overlapping. No ulcerations, gas within the soft tissues or acute
osseous abnormality. There may be an erosion off the lateral aspect
of the first metatarsal head. This may be due to altered mechanics
or gout arthritis.
IMPRESSION: No acute osseous abnormality. Hallux valgus and overlapping toes.
Possible erosion in the lateral first metatarsal head this raises
the possibility of gout arthritis in a patient with great toe pain.

## 2015-01-21 MED ORDER — HYDROCODONE-ACETAMINOPHEN 5-325 MG PO TABS: 1.0000 | ORAL_TABLET | Freq: Four times a day (QID) | ORAL | Status: DC | PRN

## 2015-01-21 MED ORDER — INDOMETHACIN 25 MG PO CAPS: 25.0000 mg | ORAL_CAPSULE | Freq: Three times a day (TID) | ORAL | Status: DC | PRN

## 2015-01-21 NOTE — ED Notes (Signed)
DC instructions discussed with pt, safety while taking PO pain meds, importance of following up with primary MD and utilizing comfortable shoes and elevation to aid in comfort of toe/ foot pain

## 2015-01-21 NOTE — ED Notes (Signed)
CBG 125. Casimiro NeedleMichael, RN notified.

## 2015-01-21 NOTE — ED Notes (Signed)
Left foot elevated on stretcher for comfort

## 2015-01-21 NOTE — ED Notes (Signed)
MD at bedside. 

## 2015-01-21 NOTE — ED Notes (Signed)
Toe pain denies injury. States he wore some tight socks and think that may have caused it. Denies history of gout. Borderline diabetes per patient.

## 2015-01-21 NOTE — ED Notes (Signed)
Denies any injury/ trauma to area
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
## 2015-01-21 NOTE — Discharge Instructions (Signed)

## 2015-01-21 NOTE — ED Notes (Signed)
Presents with Left great toe pain, awoke this am with swelling in area, very tender to touch

## 2015-02-04 ENCOUNTER — Emergency Department (HOSPITAL_BASED_OUTPATIENT_CLINIC_OR_DEPARTMENT_OTHER)
Admission: EM | Admit: 2015-02-04 | Discharge: 2015-02-04 | Disposition: A | Discharge status: Home / Self Care | Payer: Medicaid Other | Attending: Emergency Medicine | Admitting: Emergency Medicine

## 2015-02-04 ENCOUNTER — Encounter (HOSPITAL_BASED_OUTPATIENT_CLINIC_OR_DEPARTMENT_OTHER): Payer: Self-pay

## 2015-02-04 ENCOUNTER — Emergency Department (HOSPITAL_BASED_OUTPATIENT_CLINIC_OR_DEPARTMENT_OTHER): Payer: Medicaid Other

## 2015-02-04 DIAGNOSIS — R197 Diarrhea, unspecified: Secondary | ICD-10-CM

## 2015-02-04 DIAGNOSIS — E780 Pure hypercholesterolemia: Secondary | ICD-10-CM | POA: Insufficient documentation

## 2015-02-04 DIAGNOSIS — E860 Dehydration: Secondary | ICD-10-CM | POA: Insufficient documentation

## 2015-02-04 DIAGNOSIS — R109 Unspecified abdominal pain: Secondary | ICD-10-CM | POA: Diagnosis present

## 2015-02-04 DIAGNOSIS — Z9049 Acquired absence of other specified parts of digestive tract: Secondary | ICD-10-CM | POA: Insufficient documentation

## 2015-02-04 DIAGNOSIS — N289 Disorder of kidney and ureter, unspecified: Secondary | ICD-10-CM | POA: Diagnosis not present

## 2015-02-04 DIAGNOSIS — Z9889 Other specified postprocedural states: Secondary | ICD-10-CM | POA: Diagnosis not present

## 2015-02-04 DIAGNOSIS — Z7951 Long term (current) use of inhaled steroids: Secondary | ICD-10-CM | POA: Insufficient documentation

## 2015-02-04 DIAGNOSIS — Z79899 Other long term (current) drug therapy: Secondary | ICD-10-CM | POA: Diagnosis not present

## 2015-02-04 DIAGNOSIS — R1084 Generalized abdominal pain: Secondary | ICD-10-CM | POA: Diagnosis not present

## 2015-02-04 DIAGNOSIS — Z8719 Personal history of other diseases of the digestive system: Secondary | ICD-10-CM | POA: Diagnosis not present

## 2015-02-04 DIAGNOSIS — I10 Essential (primary) hypertension: Secondary | ICD-10-CM | POA: Diagnosis not present

## 2015-02-04 DIAGNOSIS — Z87891 Personal history of nicotine dependence: Secondary | ICD-10-CM | POA: Diagnosis not present

## 2015-02-04 DIAGNOSIS — J45909 Unspecified asthma, uncomplicated: Secondary | ICD-10-CM | POA: Insufficient documentation

## 2015-02-04 DIAGNOSIS — R112 Nausea with vomiting, unspecified: Secondary | ICD-10-CM

## 2015-02-04 DIAGNOSIS — F329 Major depressive disorder, single episode, unspecified: Secondary | ICD-10-CM | POA: Insufficient documentation

## 2015-02-04 LAB — CBC WITH DIFFERENTIAL/PLATELET
Basophils Absolute: 0 10*3/uL (ref 0.0–0.1)
Basophils Relative: 0 % (ref 0–1)
Eosinophils Absolute: 0.2 10*3/uL (ref 0.0–0.7)
Eosinophils Relative: 1 % (ref 0–5)
HCT: 44.5 % (ref 39.0–52.0)
Hemoglobin: 14.7 g/dL (ref 13.0–17.0)
Lymphocytes Relative: 13 % (ref 12–46)
Lymphs Abs: 1.7 10*3/uL (ref 0.7–4.0)
MCH: 26.6 pg (ref 26.0–34.0)
MCHC: 33 g/dL (ref 30.0–36.0)
MCV: 80.5 fL (ref 78.0–100.0)
Monocytes Absolute: 1 10*3/uL (ref 0.1–1.0)
Monocytes Relative: 8 % (ref 3–12)
Neutro Abs: 9.7 10*3/uL — ABNORMAL HIGH (ref 1.7–7.7)
Neutrophils Relative %: 78 % — ABNORMAL HIGH (ref 43–77)
Platelets: 192 10*3/uL (ref 150–400)
RBC: 5.53 MIL/uL (ref 4.22–5.81)
RDW: 15 % (ref 11.5–15.5)
WBC: 12.6 10*3/uL — ABNORMAL HIGH (ref 4.0–10.5)

## 2015-02-04 LAB — COMPREHENSIVE METABOLIC PANEL
ALT: 52 U/L (ref 17–63)
AST: 56 U/L — ABNORMAL HIGH (ref 15–41)
Albumin: 4.2 g/dL (ref 3.5–5.0)
Alkaline Phosphatase: 93 U/L (ref 38–126)
Anion gap: 11 (ref 5–15)
BUN: 17 mg/dL (ref 6–20)
CO2: 24 mmol/L (ref 22–32)
Calcium: 9.2 mg/dL (ref 8.9–10.3)
Chloride: 98 mmol/L — ABNORMAL LOW (ref 101–111)
Creatinine, Ser: 1.37 mg/dL — ABNORMAL HIGH (ref 0.61–1.24)
GFR calc Af Amer: >60 mL/min (ref >60)
GFR calc non Af Amer: >60 mL/min (ref >60)
Glucose, Bld: 130 mg/dL — ABNORMAL HIGH (ref 65–99)
Potassium: 3.4 mmol/L — ABNORMAL LOW (ref 3.5–5.1)
Sodium: 133 mmol/L — ABNORMAL LOW (ref 135–145)
Total Bilirubin: 0.8 mg/dL (ref 0.3–1.2)
Total Protein: 8.3 g/dL — ABNORMAL HIGH (ref 6.5–8.1)

## 2015-02-04 LAB — URINALYSIS, ROUTINE W REFLEX MICROSCOPIC
Bilirubin Urine: NEGATIVE
Glucose, UA: NEGATIVE mg/dL
Ketones, ur: NEGATIVE mg/dL
Nitrite: NEGATIVE
Protein, ur: 30 mg/dL — AB
Specific Gravity, Urine: 1.024 (ref 1.005–1.030)
Urobilinogen, UA: 0.2 mg/dL (ref 0.0–1.0)
pH: 5.5 (ref 5.0–8.0)

## 2015-02-04 LAB — LIPASE, BLOOD: Lipase: 17 U/L — ABNORMAL LOW (ref 22–51)

## 2015-02-04 LAB — URINE MICROSCOPIC-ADD ON
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
## 2015-02-04 MED ORDER — MORPHINE SULFATE 4 MG/ML IJ SOLN
4.0000 mg | Freq: Once | INTRAMUSCULAR | Status: AC
  Administered 2015-02-04: 4 mg via INTRAVENOUS
  Filled 2015-02-04: qty 1

## 2015-02-04 MED ORDER — SODIUM CHLORIDE 0.9 % IV BOLUS (SEPSIS)
1000.0000 mL | Freq: Once | INTRAVENOUS | Status: AC
  Administered 2015-02-04: 1000 mL via INTRAVENOUS

## 2015-02-04 MED ORDER — METOCLOPRAMIDE HCL 10 MG PO TABS: 10.0000 mg | ORAL_TABLET | Freq: Four times a day (QID) | ORAL | Status: DC | PRN

## 2015-02-04 MED ORDER — ONDANSETRON HCL 4 MG/2ML IJ SOLN
4.0000 mg | Freq: Once | INTRAMUSCULAR | Status: AC
  Administered 2015-02-04: 4 mg via INTRAVENOUS
  Filled 2015-02-04: qty 2

## 2015-02-04 MED ORDER — HYDROCODONE-ACETAMINOPHEN 5-325 MG PO TABS: 1.0000 | ORAL_TABLET | ORAL | Status: DC | PRN

## 2015-02-04 NOTE — ED Notes (Signed)
MD at bedside. 

## 2015-02-04 NOTE — ED Notes (Signed)
MD at bedside to discuss plan of care

## 2015-02-04 NOTE — ED Notes (Signed)
Water now offered per pt request again.

## 2015-02-04 NOTE — ED Notes (Signed)
Patient transported to X-ray via stretcher per tech. 

## 2015-02-04 NOTE — Discharge Instructions (Signed)
Stay hydrated.   Your kidney function is slightly abnormal from dehydration, recheck with your doctor in a week.  Stop indomethacin.  Take vicodin for severe pain. Do NOT drive with it.   Take reglan for nausea.  Take imodium every time you have diarrhea, up to 12 times daily.   See your doctor.   Return to ER if you have fever, vomiting, dehydration, severe abdominal pain.

## 2015-02-04 NOTE — ED Notes (Signed)
Pt requesting water to drink, instructed to remain npo.
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
## 2015-02-04 NOTE — ED Notes (Signed)
Pt reports subjective fever, diffuse abd pain, n/v/d x 3 days.   Denies sick contact.

## 2015-03-05 ENCOUNTER — Emergency Department (HOSPITAL_BASED_OUTPATIENT_CLINIC_OR_DEPARTMENT_OTHER)
Admission: EM | Admit: 2015-03-05 | Discharge: 2015-03-05 | Disposition: A | Discharge status: Home / Self Care | Payer: Medicaid Other | Attending: Emergency Medicine | Admitting: Emergency Medicine

## 2015-03-05 ENCOUNTER — Encounter (HOSPITAL_BASED_OUTPATIENT_CLINIC_OR_DEPARTMENT_OTHER): Payer: Self-pay | Admitting: Emergency Medicine

## 2015-03-05 DIAGNOSIS — R0602 Shortness of breath: Secondary | ICD-10-CM | POA: Diagnosis present

## 2015-03-05 DIAGNOSIS — Z792 Long term (current) use of antibiotics: Secondary | ICD-10-CM | POA: Diagnosis not present

## 2015-03-05 DIAGNOSIS — Z87891 Personal history of nicotine dependence: Secondary | ICD-10-CM | POA: Insufficient documentation

## 2015-03-05 DIAGNOSIS — F329 Major depressive disorder, single episode, unspecified: Secondary | ICD-10-CM | POA: Diagnosis not present

## 2015-03-05 DIAGNOSIS — J4521 Mild intermittent asthma with (acute) exacerbation: Secondary | ICD-10-CM | POA: Diagnosis not present

## 2015-03-05 DIAGNOSIS — E119 Type 2 diabetes mellitus without complications: Secondary | ICD-10-CM | POA: Insufficient documentation

## 2015-03-05 DIAGNOSIS — I10 Essential (primary) hypertension: Secondary | ICD-10-CM | POA: Diagnosis not present

## 2015-03-05 DIAGNOSIS — Z79899 Other long term (current) drug therapy: Secondary | ICD-10-CM | POA: Insufficient documentation

## 2015-03-05 DIAGNOSIS — E780 Pure hypercholesterolemia: Secondary | ICD-10-CM | POA: Insufficient documentation

## 2015-03-05 MED ORDER — ALBUTEROL SULFATE (2.5 MG/3ML) 0.083% IN NEBU
2.5000 mg | INHALATION_SOLUTION | Freq: Once | RESPIRATORY_TRACT | Status: AC
  Administered 2015-03-05: 2.5 mg via RESPIRATORY_TRACT
  Filled 2015-03-05: qty 3

## 2015-03-05 MED ORDER — IPRATROPIUM-ALBUTEROL 0.5-2.5 (3) MG/3ML IN SOLN
3.0000 mL | Freq: Once | RESPIRATORY_TRACT | Status: AC
  Administered 2015-03-05: 3 mL via RESPIRATORY_TRACT
  Filled 2015-03-05: qty 3

## 2015-03-05 MED ORDER — BUDESONIDE-FORMOTEROL FUMARATE 160-4.5 MCG/ACT IN AERO: 2.0000 | INHALATION_SPRAY | Freq: Two times a day (BID) | RESPIRATORY_TRACT | Status: DC

## 2015-03-05 MED ORDER — PREDNISONE 20 MG PO TABS: 40.0000 mg | ORAL_TABLET | Freq: Every day | ORAL | Status: AC
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
## 2015-03-05 NOTE — ED Notes (Signed)
Pt states has had SOB/ wheezing x 4 days, utilized home neb w/o relief.

## 2015-03-05 NOTE — ED Notes (Signed)
Sob x 4 days, hx of asthma.  Exp wheezing bilaterally.

## 2015-03-05 NOTE — Discharge Instructions (Signed)
Asthma exacerbations can be triggered with medication noncompliance or worsening environmental exposures. You have both risk factors present. A refill prescription for your symbicort inhaler is provided today, please fill this and use twice daily. A 5 day prednisone course is provided take 2 pills daily, starting today after filling prescription.  Please follow up with a primary care visit, urgent care visit, or call in to refill medications as needed. Try to reduce second hand exposure to cigarette smoke, such as asking others to smoke outside or limit smoking while visiting your home. Maintaining your medications and controlling risk factors is the best way to reduce frequent emergency department visits in the future.

## 2015-03-05 NOTE — ED Notes (Signed)
On cont POX monitoring

## 2015-03-05 NOTE — ED Notes (Signed)
Pt states feels much better after HHN by RT, talking on mobile phone

## 2015-04-22 ENCOUNTER — Encounter (HOSPITAL_BASED_OUTPATIENT_CLINIC_OR_DEPARTMENT_OTHER): Payer: Self-pay | Admitting: Adult Health

## 2015-04-22 ENCOUNTER — Emergency Department (HOSPITAL_BASED_OUTPATIENT_CLINIC_OR_DEPARTMENT_OTHER)
Admission: EM | Admit: 2015-04-22 | Discharge: 2015-04-22 | Disposition: A | Discharge status: Home / Self Care | Payer: Medicaid Other | Attending: Emergency Medicine | Admitting: Emergency Medicine

## 2015-04-22 DIAGNOSIS — E119 Type 2 diabetes mellitus without complications: Secondary | ICD-10-CM | POA: Diagnosis not present

## 2015-04-22 DIAGNOSIS — J45909 Unspecified asthma, uncomplicated: Secondary | ICD-10-CM | POA: Diagnosis not present

## 2015-04-22 DIAGNOSIS — Z87891 Personal history of nicotine dependence: Secondary | ICD-10-CM | POA: Insufficient documentation

## 2015-04-22 DIAGNOSIS — I10 Essential (primary) hypertension: Secondary | ICD-10-CM | POA: Diagnosis not present

## 2015-04-22 DIAGNOSIS — Z79899 Other long term (current) drug therapy: Secondary | ICD-10-CM | POA: Diagnosis not present

## 2015-04-22 DIAGNOSIS — E7800 Pure hypercholesterolemia, unspecified: Secondary | ICD-10-CM | POA: Diagnosis not present

## 2015-04-22 DIAGNOSIS — M10071 Idiopathic gout, right ankle and foot: Secondary | ICD-10-CM | POA: Insufficient documentation

## 2015-04-22 DIAGNOSIS — Z8659 Personal history of other mental and behavioral disorders: Secondary | ICD-10-CM | POA: Diagnosis not present

## 2015-04-22 DIAGNOSIS — M109 Gout, unspecified: Secondary | ICD-10-CM | POA: Diagnosis present

## 2015-04-22 DIAGNOSIS — Z7951 Long term (current) use of inhaled steroids: Secondary | ICD-10-CM | POA: Insufficient documentation

## 2015-04-22 HISTORY — DX: Gout, unspecified: M10.9

## 2015-04-22 MED ORDER — HYDROCODONE-ACETAMINOPHEN 5-325 MG PO TABS: 1.0000 | ORAL_TABLET | Freq: Four times a day (QID) | ORAL | Status: DC | PRN

## 2015-04-22 MED ORDER — INDOMETHACIN 25 MG PO CAPS: 25.0000 mg | ORAL_CAPSULE | Freq: Three times a day (TID) | ORAL | Status: DC | PRN

## 2015-04-22 NOTE — Discharge Instructions (Signed)
Gout Gout is when your joints become red, sore, and swell (inflamed). This is caused by the buildup of uric acid crystals in the joints. Uric acid is a chemical that is normally in the blood. If the level of uric acid gets too high in the blood, these crystals form in your joints and tissues. Over time, these crystals can form into masses near the joints and tissues. These masses can destroy bone and cause the bone to look misshapen (deformed). HOME CARE   Do not take aspirin for pain.  Only take medicine as told by your doctor.  Rest the joint as much as you can. When in bed, keep sheets and blankets off painful areas.  Keep the sore joints raised (elevated).  Put warm or cold packs on painful joints. Use of warm or cold packs depends on which works best for you.  Use crutches if the painful joint is in your leg.  Drink enough fluids to keep your pee (urine) clear or pale yellow. Limit alcohol, sugary drinks, and drinks with fructose in them.  Follow your diet instructions. Pay careful attention to how much protein you eat. Include fruits, vegetables, whole grains, and fat-free or low-fat milk products in your daily diet. Talk to your doctor or dietitian about the use of coffee, vitamin C, and cherries. These may help lower uric acid levels.  Keep a healthy body weight. GET HELP RIGHT AWAY IF:   You have watery poop (diarrhea), throw up (vomit), or have any side effects from medicines.  You do not feel better in 24 hours, or you are getting worse.  Your joint becomes suddenly more tender, and you have chills or a fever. MAKE SURE YOU:   Understand these instructions.  Will watch your condition.  Will get help right away if you are not doing well or get worse.   This information is not intended to replace advice given to you by your health care provider. Make sure you discuss any questions you have with your health care provider.   Document Released: 04/03/2008 Document Revised:  07/16/2014 Document Reviewed: 02/06/2012 Elsevier Interactive Patient Education [employer]2016 Elsevier Inc.  Take medications as directed. Follow-up with your doctor. Return for any new or worse symptoms.

## 2015-04-22 NOTE — ED Notes (Signed)
Present with 2 days of right great toe redness and pain-HX of gout.

## 2015-04-22 NOTE — ED Notes (Signed)
MD at bedside. 
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
## 2015-05-20 ENCOUNTER — Encounter (HOSPITAL_BASED_OUTPATIENT_CLINIC_OR_DEPARTMENT_OTHER): Payer: Self-pay | Admitting: *Deleted

## 2015-05-20 ENCOUNTER — Emergency Department (HOSPITAL_BASED_OUTPATIENT_CLINIC_OR_DEPARTMENT_OTHER)
Admission: EM | Admit: 2015-05-20 | Discharge: 2015-05-20 | Disposition: A | Discharge status: Home / Self Care | Payer: Medicaid Other | Attending: Emergency Medicine | Admitting: Emergency Medicine

## 2015-05-20 ENCOUNTER — Emergency Department (HOSPITAL_BASED_OUTPATIENT_CLINIC_OR_DEPARTMENT_OTHER): Payer: Medicaid Other

## 2015-05-20 DIAGNOSIS — J45901 Unspecified asthma with (acute) exacerbation: Secondary | ICD-10-CM | POA: Diagnosis not present

## 2015-05-20 DIAGNOSIS — Z792 Long term (current) use of antibiotics: Secondary | ICD-10-CM | POA: Insufficient documentation

## 2015-05-20 DIAGNOSIS — I10 Essential (primary) hypertension: Secondary | ICD-10-CM | POA: Diagnosis not present

## 2015-05-20 DIAGNOSIS — M109 Gout, unspecified: Secondary | ICD-10-CM | POA: Insufficient documentation

## 2015-05-20 DIAGNOSIS — E7800 Pure hypercholesterolemia, unspecified: Secondary | ICD-10-CM | POA: Diagnosis not present

## 2015-05-20 DIAGNOSIS — E119 Type 2 diabetes mellitus without complications: Secondary | ICD-10-CM | POA: Diagnosis not present

## 2015-05-20 DIAGNOSIS — Z79899 Other long term (current) drug therapy: Secondary | ICD-10-CM | POA: Insufficient documentation

## 2015-05-20 DIAGNOSIS — Z7951 Long term (current) use of inhaled steroids: Secondary | ICD-10-CM | POA: Diagnosis not present

## 2015-05-20 DIAGNOSIS — F329 Major depressive disorder, single episode, unspecified: Secondary | ICD-10-CM | POA: Insufficient documentation

## 2015-05-20 DIAGNOSIS — R0602 Shortness of breath: Secondary | ICD-10-CM | POA: Diagnosis present

## 2015-05-20 LAB — CBC WITH DIFFERENTIAL/PLATELET
Basophils Absolute: 0 10*3/uL (ref 0.0–0.1)
Basophils Relative: 1 %
Eosinophils Absolute: 0.5 10*3/uL (ref 0.0–0.7)
Eosinophils Relative: 6 %
HCT: 44.6 % (ref 39.0–52.0)
Hemoglobin: 15 g/dL (ref 13.0–17.0)
Lymphocytes Relative: 36 %
Lymphs Abs: 2.9 10*3/uL (ref 0.7–4.0)
MCH: 26.9 pg (ref 26.0–34.0)
MCHC: 33.6 g/dL (ref 30.0–36.0)
MCV: 80.1 fL (ref 78.0–100.0)
Monocytes Absolute: 0.6 10*3/uL (ref 0.1–1.0)
Monocytes Relative: 8 %
Neutro Abs: 4.2 10*3/uL (ref 1.7–7.7)
Neutrophils Relative %: 51 %
Platelets: 175 10*3/uL (ref 150–400)
RBC: 5.57 MIL/uL (ref 4.22–5.81)
RDW: 14.5 % (ref 11.5–15.5)
WBC: 8.3 10*3/uL (ref 4.0–10.5)

## 2015-05-20 LAB — BASIC METABOLIC PANEL
Anion gap: 8 (ref 5–15)
BUN: 14 mg/dL (ref 6–20)
CO2: 27 mmol/L (ref 22–32)
Calcium: 9.3 mg/dL (ref 8.9–10.3)
Chloride: 102 mmol/L (ref 101–111)
Creatinine, Ser: 1.09 mg/dL (ref 0.61–1.24)
GFR calc Af Amer: >60 mL/min (ref >60)
GFR calc non Af Amer: >60 mL/min (ref >60)
Glucose, Bld: 144 mg/dL — ABNORMAL HIGH (ref 65–99)
Potassium: 3.8 mmol/L (ref 3.5–5.1)
Sodium: 137 mmol/L (ref 135–145)

## 2015-05-20 LAB — TROPONIN I: Troponin I: <0.03 ng/mL (ref <0.031)

## 2015-05-20 IMAGING — CR DG CHEST 2V
2 series · 2 of 2 positions shown · non-contrast
Comparison: [DATE]

CLINICAL DATA: Shortness of breath and wheezing for 2 days

EXAM:
CHEST  2 VIEW

[w chest pa]
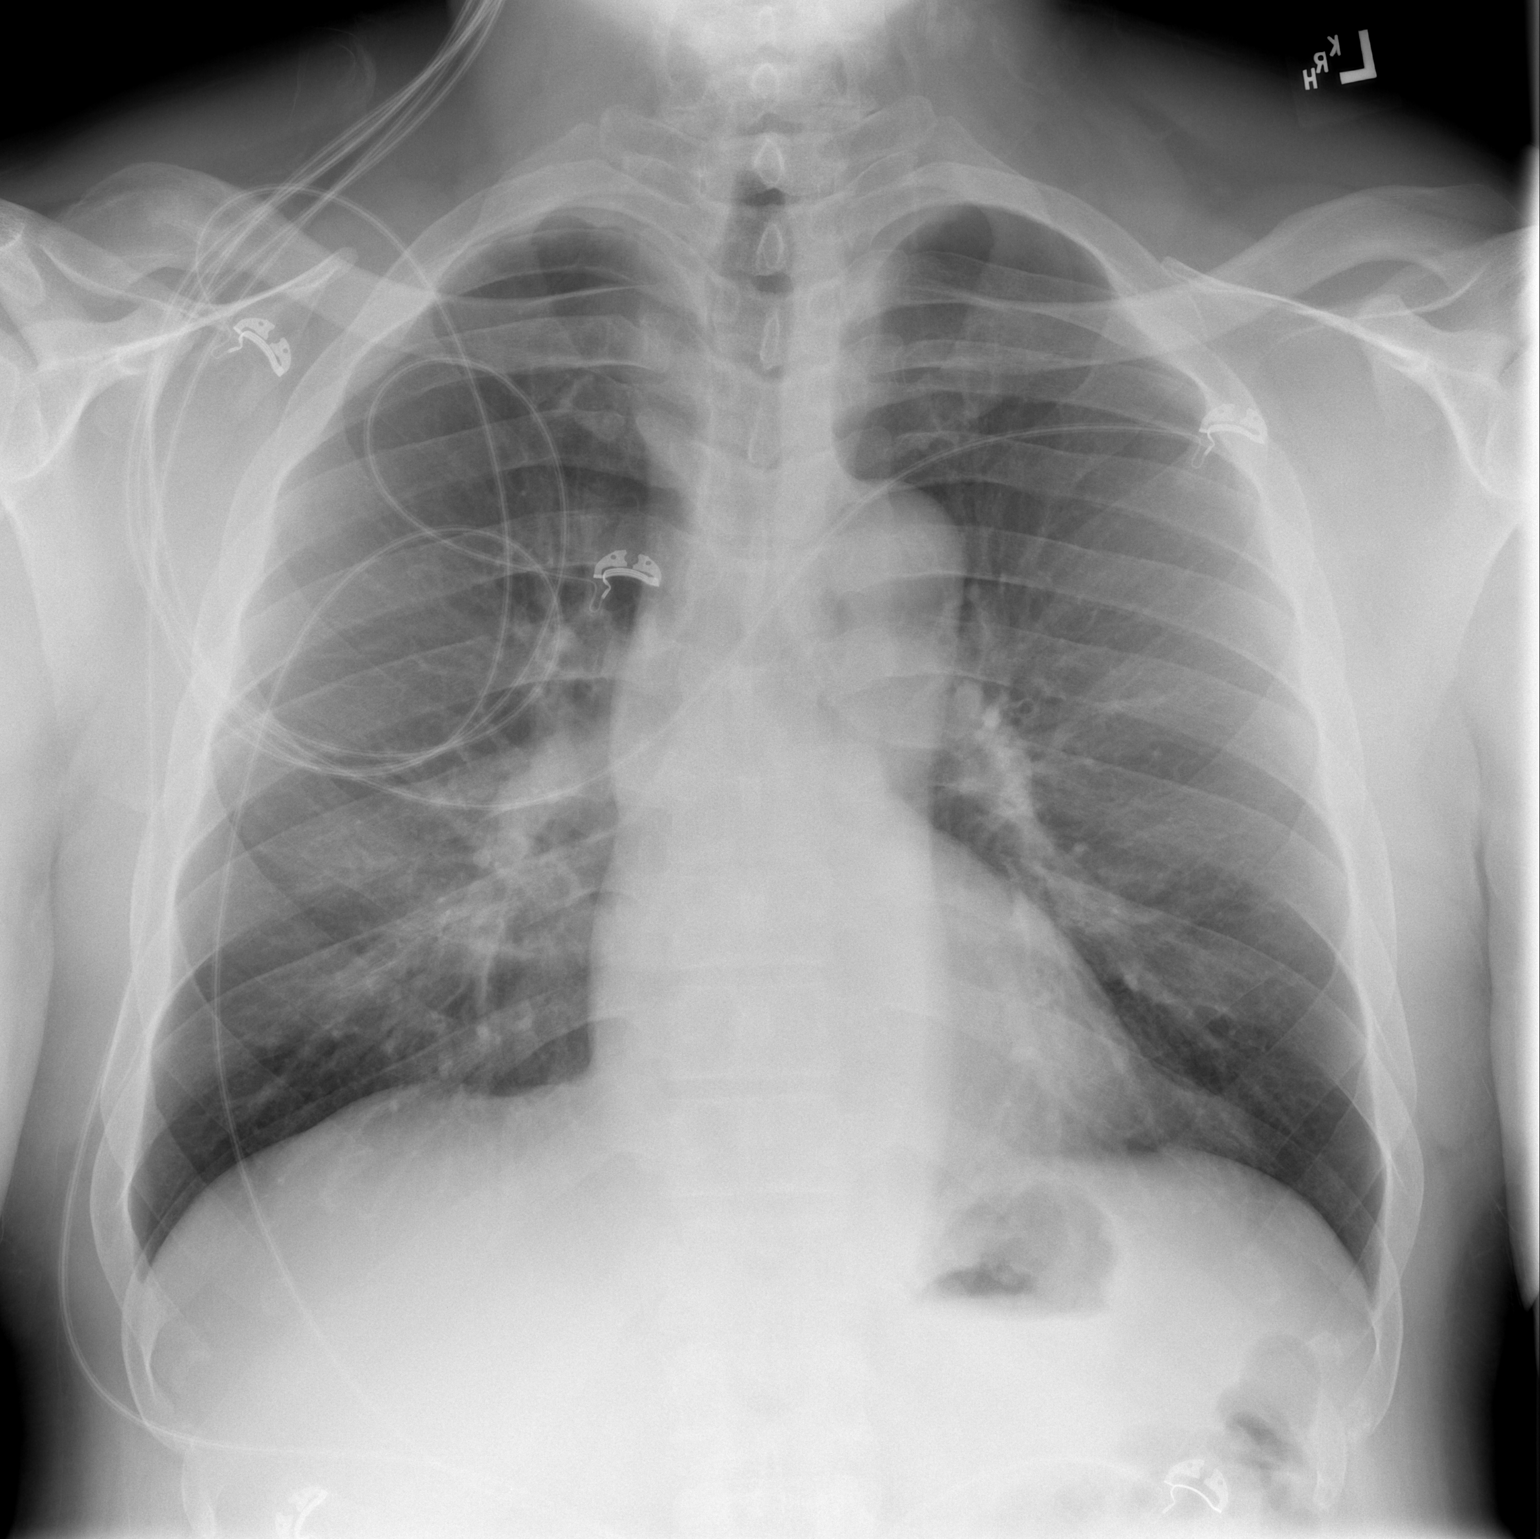

[w chest lat]
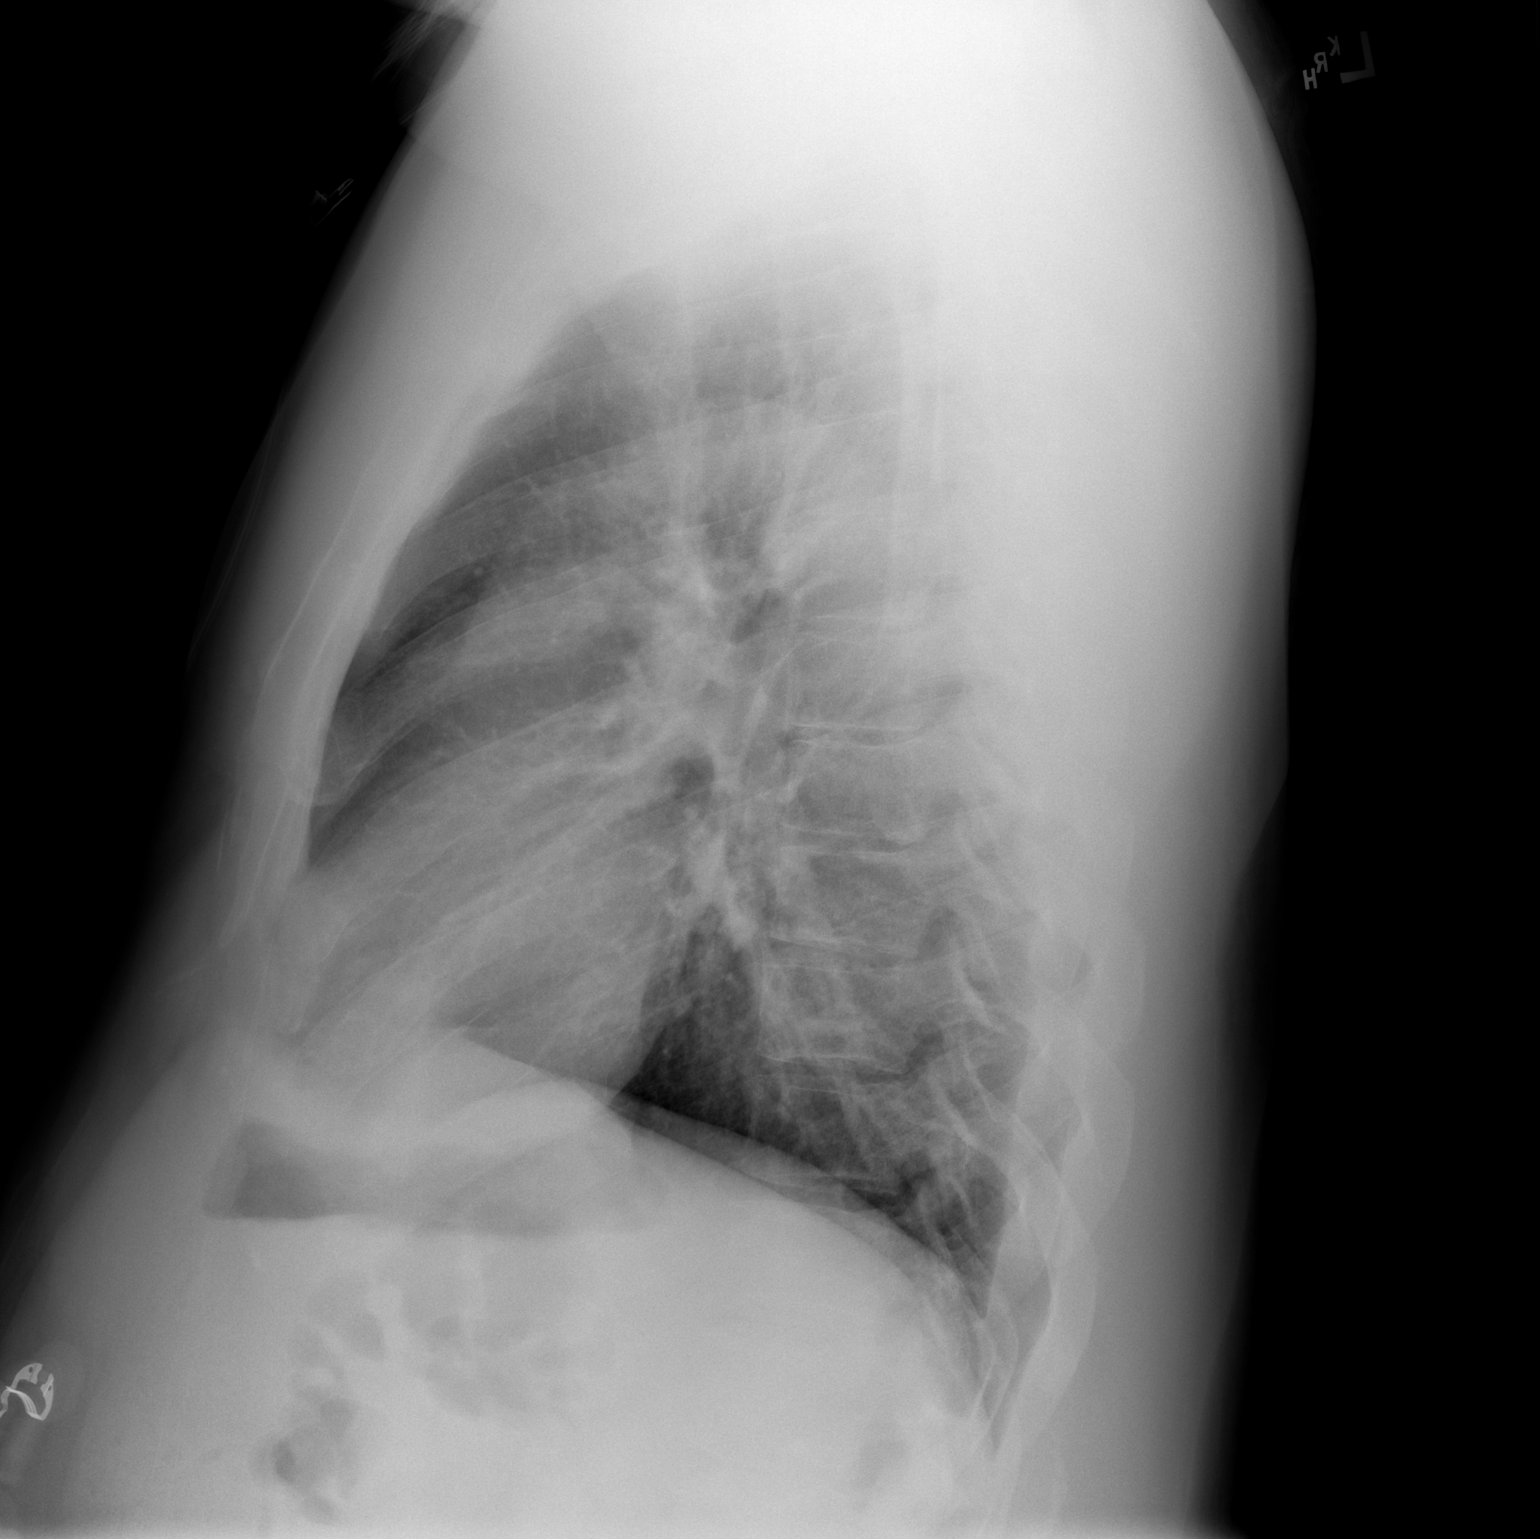

[2 of 2 positions shown; findings below may reference images not displayed]

FINDINGS: Lungs are clear. Heart size and pulmonary vascularity are normal. No
adenopathy. No bone lesions. No edema or consolidation.
IMPRESSION: No active cardiopulmonary disease.

## 2015-05-20 MED ORDER — ASPIRIN 81 MG PO CHEW
324.0000 mg | CHEWABLE_TABLET | Freq: Once | ORAL | Status: AC
  Administered 2015-05-20: 324 mg via ORAL
  Filled 2015-05-20: qty 4

## 2015-05-20 MED ORDER — ALBUTEROL SULFATE (2.5 MG/3ML) 0.083% IN NEBU
5.0000 mg | INHALATION_SOLUTION | Freq: Once | RESPIRATORY_TRACT | Status: AC
  Administered 2015-05-20: 5 mg via RESPIRATORY_TRACT
  Filled 2015-05-20: qty 6

## 2015-05-20 MED ORDER — DEXAMETHASONE 4 MG PO TABS
10.0000 mg | ORAL_TABLET | Freq: Once | ORAL | Status: AC
  Administered 2015-05-20: 10 mg via ORAL

## 2015-05-20 MED ORDER — DEXAMETHASONE 4 MG PO TABS
ORAL_TABLET | ORAL | Status: AC
  Filled 2015-05-20: qty 3

## 2015-05-20 NOTE — Discharge Instructions (Signed)
Asthma, Adult Asthma is a recurring condition in which the airways tighten and narrow. Asthma can make it difficult to breathe. It can cause coughing, wheezing, and shortness of breath. Asthma episodes, also called asthma attacks, range from minor to life-threatening. Asthma cannot be cured, but medicines and lifestyle changes can help control it. CAUSES Asthma is believed to be caused by inherited (genetic) and environmental factors, but its exact cause is unknown. Asthma may be triggered by allergens, lung infections, or irritants in the air. Asthma triggers are different for each person. Common triggers include:   Animal dander.  Dust mites.  Cockroaches.  Pollen from trees or grass.  Mold.  Smoke.  Air pollutants such as dust, household cleaners, hair sprays, aerosol sprays, paint fumes, strong chemicals, or strong odors.  Cold air, weather changes, and winds (which increase molds and pollens in the air).  Strong emotional expressions such as crying or laughing hard.  Stress.  Certain medicines (such as aspirin) or types of drugs (such as beta-blockers).  Sulfites in foods and drinks. Foods and drinks that may contain sulfites include dried fruit, potato chips, and sparkling grape juice.  Infections or inflammatory conditions such as the flu, a cold, or an inflammation of the nasal membranes (rhinitis).  Gastroesophageal reflux disease (GERD).  Exercise or strenuous activity. SYMPTOMS Symptoms may occur immediately after asthma is triggered or many hours later. Symptoms include:  Wheezing.  Excessive nighttime or early morning coughing.  Frequent or severe coughing with a common cold.  Chest tightness.  Shortness of breath. DIAGNOSIS  The diagnosis of asthma is made by a review of your medical history and a physical exam. Tests may also be performed. These may include:  Lung function studies. These tests show how much air you breathe in and out.  Allergy  tests.  Imaging tests such as X-rays. TREATMENT  Asthma cannot be cured, but it can usually be controlled. Treatment involves identifying and avoiding your asthma triggers. It also involves medicines. There are 2 classes of medicine used for asthma treatment:   Controller medicines. These prevent asthma symptoms from occurring. They are usually taken every day.  Reliever or rescue medicines. These quickly relieve asthma symptoms. They are used as needed and provide short-term relief. Your health care provider will help you create an asthma action plan. An asthma action plan is a written plan for managing and treating your asthma attacks. It includes a list of your asthma triggers and how they may be avoided. It also includes information on when medicines should be taken and when their dosage should be changed. An action plan may also involve the use of a device called a peak flow meter. A peak flow meter measures how well the lungs are working. It helps you monitor your condition. HOME CARE INSTRUCTIONS   Take medicines only as directed by your health care provider. Speak with your health care provider if you have questions about how or when to take the medicines.  Use a peak flow meter as directed by your health care provider. Record and keep track of readings.  Understand and use the action plan to help minimize or stop an asthma attack without needing to seek medical care.  Control your home environment in the following ways to help prevent asthma attacks:  Do not smoke. Avoid being exposed to secondhand smoke.  Change your heating and air conditioning filter regularly.  Limit your use of fireplaces and wood stoves.  Get rid of pests (such as roaches   and mice) and their droppings.  Throw away plants if you see mold on them.  Clean your floors and dust regularly. Use unscented cleaning products.  Try to have someone else vacuum for you regularly. Stay out of rooms while they are  being vacuumed and for a short while afterward. If you vacuum, use a dust mask from a hardware store, a double-layered or microfilter vacuum cleaner bag, or a vacuum cleaner with a HEPA filter.  Replace carpet with wood, tile, or vinyl flooring. Carpet can trap dander and dust.  Use allergy-proof pillows, mattress covers, and box spring covers.  Wash bed sheets and blankets every week in hot water and dry them in a dryer.  Use blankets that are made of polyester or cotton.  Clean bathrooms and kitchens with bleach. If possible, have someone repaint the walls in these rooms with mold-resistant paint. Keep out of the rooms that are being cleaned and painted.  Wash hands frequently. SEEK MEDICAL CARE IF:   You have wheezing, shortness of breath, or a cough even if taking medicine to prevent attacks.  The colored mucus you cough up (sputum) is thicker than usual.  Your sputum changes from clear or white to yellow, green, gray, or bloody.  You have any problems that may be related to the medicines you are taking (such as a rash, itching, swelling, or trouble breathing).  You are using a reliever medicine more than 2-3 times per week.  Your peak flow is still at 50-79% of your personal best after following your action plan for 1 hour.  You have a fever. SEEK IMMEDIATE MEDICAL CARE IF:   You seem to be getting worse and are unresponsive to treatment during an asthma attack.  You are short of breath even at rest.  You get short of breath when doing very Caleb Hunter physical activity.  You have difficulty eating, drinking, or talking due to asthma symptoms.  You develop chest pain.  You develop a fast heartbeat.  You have a bluish color to your lips or fingernails.  You are light-headed, dizzy, or faint.  Your peak flow is less than 50% of your personal best.   This information is not intended to replace advice given to you by your health care provider. Make sure you discuss any  questions you have with your health care provider.   Document Released: 06/25/2005 Document Revised: 03/16/2015 Document Reviewed: 01/22/2013 Elsevier Interactive Patient Education 2016 Elsevier Inc.  
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
## 2015-05-20 NOTE — ED Notes (Signed)
Pt immediately placed in room, RT and RN at bedside

## 2015-05-20 NOTE — ED Notes (Signed)
States has been wheezing all night, has tried nebulizer and MDI therapy, cont to have SOB with wheezing

## 2015-05-20 NOTE — ED Notes (Signed)
MD at bedside. 

## 2015-05-20 NOTE — ED Notes (Signed)
Placed on cont POX monitoring with int NBP monitoring 

## 2015-06-08 ENCOUNTER — Encounter (HOSPITAL_BASED_OUTPATIENT_CLINIC_OR_DEPARTMENT_OTHER): Payer: Self-pay | Admitting: Emergency Medicine

## 2015-06-08 ENCOUNTER — Emergency Department (HOSPITAL_BASED_OUTPATIENT_CLINIC_OR_DEPARTMENT_OTHER)
Admission: EM | Admit: 2015-06-08 | Discharge: 2015-06-08 | Disposition: A | Discharge status: Home / Self Care | Payer: Medicaid Other | Attending: Emergency Medicine | Admitting: Emergency Medicine

## 2015-06-08 DIAGNOSIS — I10 Essential (primary) hypertension: Secondary | ICD-10-CM | POA: Diagnosis not present

## 2015-06-08 DIAGNOSIS — J4521 Mild intermittent asthma with (acute) exacerbation: Secondary | ICD-10-CM

## 2015-06-08 DIAGNOSIS — E119 Type 2 diabetes mellitus without complications: Secondary | ICD-10-CM | POA: Diagnosis not present

## 2015-06-08 DIAGNOSIS — E7800 Pure hypercholesterolemia, unspecified: Secondary | ICD-10-CM | POA: Diagnosis not present

## 2015-06-08 DIAGNOSIS — R0602 Shortness of breath: Secondary | ICD-10-CM | POA: Diagnosis present

## 2015-06-08 DIAGNOSIS — Z8719 Personal history of other diseases of the digestive system: Secondary | ICD-10-CM | POA: Diagnosis not present

## 2015-06-08 DIAGNOSIS — Z87891 Personal history of nicotine dependence: Secondary | ICD-10-CM | POA: Insufficient documentation

## 2015-06-08 DIAGNOSIS — Z8659 Personal history of other mental and behavioral disorders: Secondary | ICD-10-CM | POA: Insufficient documentation

## 2015-06-08 DIAGNOSIS — Z79899 Other long term (current) drug therapy: Secondary | ICD-10-CM | POA: Diagnosis not present

## 2015-06-08 DIAGNOSIS — M109 Gout, unspecified: Secondary | ICD-10-CM | POA: Insufficient documentation

## 2015-06-08 DIAGNOSIS — Z7951 Long term (current) use of inhaled steroids: Secondary | ICD-10-CM | POA: Diagnosis not present

## 2015-06-08 MED ORDER — DEXAMETHASONE 4 MG PO TABS
ORAL_TABLET | ORAL | Status: AC
  Administered 2015-06-08: 10 mg via ORAL
  Filled 2015-06-08: qty 3

## 2015-06-08 MED ORDER — ALBUTEROL SULFATE (2.5 MG/3ML) 0.083% IN NEBU
INHALATION_SOLUTION | RESPIRATORY_TRACT | Status: AC
  Administered 2015-06-08: 2.5 mg
  Filled 2015-06-08: qty 3

## 2015-06-08 MED ORDER — DEXAMETHASONE 4 MG PO TABS
10.0000 mg | ORAL_TABLET | Freq: Once | ORAL | Status: AC
  Administered 2015-06-08: 10 mg via ORAL

## 2015-06-08 MED ORDER — IPRATROPIUM-ALBUTEROL 0.5-2.5 (3) MG/3ML IN SOLN
RESPIRATORY_TRACT | Status: AC
  Administered 2015-06-08: 3 mL
  Filled 2015-06-08: qty 3

## 2015-06-08 NOTE — ED Notes (Signed)
MD at bedside. 

## 2015-06-08 NOTE — ED Notes (Signed)
Sob x 3 days. Worse last night.
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
## 2015-06-08 NOTE — Discharge Instructions (Signed)
Asthma Attack Prevention While you may not be able to control the fact that you have asthma, you can take actions to prevent asthma attacks. The best way to prevent asthma attacks is to maintain good control of your asthma. You can achieve this by:  Taking your medicines as directed.  Avoiding things that can irritate your airways or make your asthma symptoms worse (asthma triggers).  Keeping track of how well your asthma is controlled and of any changes in your symptoms.  Responding quickly to worsening asthma symptoms (asthma attack).  Seeking emergency care when it is needed. WHAT ARE SOME WAYS TO PREVENT AN ASTHMA ATTACK? Have a Plan Work with your health care provider to create a written plan for managing and treating your asthma attacks (asthma action plan). This plan includes:  A list of your asthma triggers and how you can avoid them.  Information on when medicines should be taken and when their dosages should be changed.  The use of a device that measures how well your lungs are working (peak flow meter). Monitor Your Asthma Use your peak flow meter and record your results in a journal every day. A drop in your peak flow numbers on one or more days may indicate the start of an asthma attack. This can happen even before you start to feel symptoms. You can prevent an asthma attack from getting worse by following the steps in your asthma action plan. Avoid Asthma Triggers Work with your asthma health care provider to find out what your asthma triggers are. This can be done by:  Allergy testing.  Keeping a journal that notes when asthma attacks occur and the factors that may have contributed to them.  Determining if there are other medical conditions that are making your asthma worse. Once you have determined your asthma triggers, take steps to avoid them. This may include avoiding excessive or prolonged exposure to:  Dust. Have someone dust and vacuum your home for you once or  twice a week. Using a high-efficiency particulate arrestance (HEPA) vacuum is best.  Smoke. This includes campfire smoke, forest fire smoke, and secondhand smoke from tobacco products.  Pet dander. Avoid contact with animals that you know you are allergic to.  Allergens from trees, grasses or pollens. Avoid spending a lot of time outdoors when pollen counts are high, and on very windy days.  Very cold, dry, or humid air.  Mold.  Foods that contain high amounts of sulfites.  Strong odors.  Outdoor air pollutants, such as engine exhaust.  Indoor air pollutants, such as aerosol sprays and fumes from household cleaners.  Household pests, including dust mites and cockroaches, and pest droppings.  Certain medicines, including NSAIDs. Always talk to your health care provider before stopping or starting any new medicines. Medicines Take over-the-counter and prescription medicines only as told by your health care provider. Many asthma attacks can be prevented by carefully following your medicine schedule. Taking your medicines correctly is especially important when you cannot avoid certain asthma triggers. Act Quickly If an asthma attack does happen, acting quickly can decrease how severe it is and how long it lasts. Take these steps:   Pay attention to your symptoms. If you are coughing, wheezing, or having difficulty breathing, do not wait to see if your symptoms go away on their own. Follow your asthma action plan.  If you have followed your asthma action plan and your symptoms are not improving, call your health care provider or seek immediate medical care   at the nearest hospital. It is important to note how often you need to use your fast-acting rescue inhaler. If you are using your rescue inhaler more often, it may mean that your asthma is not under control. Adjusting your asthma treatment plan may help you to prevent future asthma attacks and help you to gain better control of your  condition. HOW CAN I PREVENT AN ASTHMA ATTACK WHEN I EXERCISE? Follow advice from your health care provider about whether you should use your fast-acting inhaler before exercising. Many people with asthma experience exercise-induced bronchoconstriction (EIB). This condition often worsens during vigorous exercise in cold, humid, or dry environments. Usually, people with EIB can stay very active by pre-treating with a fast-acting inhaler before exercising.   This information is not intended to replace advice given to you by your health care provider. Make sure you discuss any questions you have with your health care provider.   Document Released: 06/13/2009 Document Revised: 03/16/2015 Document Reviewed: 11/25/2014 Elsevier Interactive Patient Education 2016 [employer] Inc[employer]  

## 2015-08-29 DIAGNOSIS — M5030 Other cervical disc degeneration, unspecified cervical region: Secondary | ICD-10-CM | POA: Insufficient documentation

## 2015-08-29 DIAGNOSIS — M5136 Other intervertebral disc degeneration, lumbar region: Secondary | ICD-10-CM | POA: Insufficient documentation

## 2015-08-29 DIAGNOSIS — G8929 Other chronic pain: Secondary | ICD-10-CM | POA: Insufficient documentation

## 2015-08-29 DIAGNOSIS — M549 Dorsalgia, unspecified: Secondary | ICD-10-CM

## 2015-08-29 DIAGNOSIS — M542 Cervicalgia: Secondary | ICD-10-CM | POA: Insufficient documentation

## 2016-01-05 ENCOUNTER — Encounter (HOSPITAL_BASED_OUTPATIENT_CLINIC_OR_DEPARTMENT_OTHER): Payer: Self-pay | Admitting: *Deleted

## 2016-01-05 ENCOUNTER — Emergency Department (HOSPITAL_BASED_OUTPATIENT_CLINIC_OR_DEPARTMENT_OTHER)
Admission: EM | Admit: 2016-01-05 | Discharge: 2016-01-05 | Disposition: A | Discharge status: Home / Self Care | Payer: Medicaid Other | Attending: Emergency Medicine | Admitting: Emergency Medicine

## 2016-01-05 DIAGNOSIS — F329 Major depressive disorder, single episode, unspecified: Secondary | ICD-10-CM | POA: Insufficient documentation

## 2016-01-05 DIAGNOSIS — I10 Essential (primary) hypertension: Secondary | ICD-10-CM | POA: Diagnosis not present

## 2016-01-05 DIAGNOSIS — Z792 Long term (current) use of antibiotics: Secondary | ICD-10-CM | POA: Insufficient documentation

## 2016-01-05 DIAGNOSIS — E119 Type 2 diabetes mellitus without complications: Secondary | ICD-10-CM | POA: Diagnosis not present

## 2016-01-05 DIAGNOSIS — Z79899 Other long term (current) drug therapy: Secondary | ICD-10-CM | POA: Diagnosis not present

## 2016-01-05 DIAGNOSIS — J45909 Unspecified asthma, uncomplicated: Secondary | ICD-10-CM | POA: Diagnosis not present

## 2016-01-05 DIAGNOSIS — Z7951 Long term (current) use of inhaled steroids: Secondary | ICD-10-CM | POA: Diagnosis not present

## 2016-01-05 DIAGNOSIS — J45901 Unspecified asthma with (acute) exacerbation: Secondary | ICD-10-CM

## 2016-01-05 DIAGNOSIS — Z87891 Personal history of nicotine dependence: Secondary | ICD-10-CM | POA: Insufficient documentation

## 2016-01-05 DIAGNOSIS — R062 Wheezing: Secondary | ICD-10-CM | POA: Diagnosis present

## 2016-01-05 LAB — CBG MONITORING, ED: Glucose-Capillary: 107 mg/dL — ABNORMAL HIGH (ref 65–99)

## 2016-01-05 MED ORDER — PREDNISONE 50 MG PO TABS
60.0000 mg | ORAL_TABLET | Freq: Once | ORAL | Status: AC
  Administered 2016-01-05: 60 mg via ORAL
  Filled 2016-01-05: qty 1

## 2016-01-05 MED ORDER — ALBUTEROL (5 MG/ML) CONTINUOUS INHALATION SOLN
15.0000 mg | INHALATION_SOLUTION | Freq: Once | RESPIRATORY_TRACT | Status: AC
  Administered 2016-01-05: 15 mg via RESPIRATORY_TRACT
  Filled 2016-01-05: qty 20

## 2016-01-05 MED ORDER — PREDNISONE 20 MG PO TABS: ORAL_TABLET | ORAL | Status: DC

## 2016-01-05 MED FILL — predniSONE 20 MG TABS: 20 | 4 days supply | Qty: 12 | Fill #0
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
## 2016-01-05 NOTE — ED Notes (Signed)
Pt reports sob and wheezing x 1 week, his usual asthma exacerbation sx. Reports tightness in chest x 2 days ago. Rt alerted to bedside.

## 2016-01-05 NOTE — Discharge Instructions (Signed)
Asthma, Adult Use your albuterol inhaler or nebulizer every 4 hours as needed for shortness of breath. Return if needed more than every 4 hours or see Dr.Osei-Bonsu Asthma is a condition of the lungs in which the airways tighten and narrow. Asthma can make it hard to breathe. Asthma cannot be cured, but medicine and lifestyle changes can help control it. Asthma may be started (triggered) by:  Animal skin flakes (dander).  Dust.  Cockroaches.  Pollen.  Mold.  Smoke.  Cleaning products.  Hair sprays or aerosol sprays.  Paint fumes or strong smells.  Cold air, weather changes, and winds.  Crying or laughing hard.  Stress.  Certain medicines or drugs.  Foods, such as dried fruit, potato chips, and sparkling grape juice.  Infections or conditions (colds, flu).  Exercise.  Certain medical conditions or diseases.  Exercise or tiring activities. HOME CARE   Take medicine as told by your doctor.  Use a peak flow meter as told by your doctor. A peak flow meter is a tool that measures how well the lungs are working.  Record and keep track of the peak flow meter's readings.  Understand and use the asthma action plan. An asthma action plan is a written plan for taking care of your asthma and treating your attacks.  To help prevent asthma attacks:  Do not smoke. Stay away from secondhand smoke.  Change your heating and air conditioning filter often.  Limit your use of fireplaces and wood stoves.  Get rid of pests (such as roaches and mice) and their droppings.  Throw away plants if you see mold on them.  Clean your floors. Dust regularly. Use cleaning products that do not smell.  Have someone vacuum when you are not home. Use a vacuum cleaner with a HEPA filter if possible.  Replace carpet with wood, tile, or vinyl flooring. Carpet can trap animal skin flakes and dust.  Use allergy-proof pillows, mattress covers, and box spring covers.  Wash bed sheets and  blankets every week in hot water and dry them in a dryer.  Use blankets that are made of polyester or cotton.  Clean bathrooms and kitchens with bleach. If possible, have someone repaint the walls in these rooms with mold-resistant paint. Keep out of the rooms that are being cleaned and painted.  Wash hands often. GET HELP IF:  You have make a whistling sound when breaking (wheeze), have shortness of breath, or have a cough even if taking medicine to prevent attacks.  The colored mucus you cough up (sputum) is thicker than usual.  The colored mucus you cough up changes from clear or white to yellow, green, gray, or bloody.  You have problems from the medicine you are taking such as:  A rash.  Itching.  Swelling.  Trouble breathing.  You need reliever medicines more than 2-3 times a week.  Your peak flow measurement is still at 50-79% of your personal best after following the action plan for 1 hour.  You have a fever. GET HELP RIGHT AWAY IF:   You seem to be worse and are not responding to medicine during an asthma attack.  You are short of breath even at rest.  You get short of breath when doing very little activity.  You have trouble eating, drinking, or talking.  You have chest pain.  You have a fast heartbeat.  Your lips or fingernails start to turn blue.  You are light-headed, dizzy, or faint.  Your peak flow is less  than 50% of your personal best.   This information is not intended to replace advice given to you by your health care provider. Make sure you discuss any questions you have with your health care provider.   Document Released: 12/12/2007 Document Revised: 03/16/2015 Document Reviewed: 01/22/2013 Elsevier Interactive Patient Education [employer]2016 [employer]

## 2016-02-18 ENCOUNTER — Encounter (HOSPITAL_BASED_OUTPATIENT_CLINIC_OR_DEPARTMENT_OTHER): Payer: Self-pay | Admitting: *Deleted

## 2016-02-18 ENCOUNTER — Emergency Department (HOSPITAL_BASED_OUTPATIENT_CLINIC_OR_DEPARTMENT_OTHER)
Admission: EM | Admit: 2016-02-18 | Discharge: 2016-02-18 | Disposition: A | Discharge status: Home / Self Care | Payer: Medicaid Other | Attending: Emergency Medicine | Admitting: Emergency Medicine

## 2016-02-18 DIAGNOSIS — Z87891 Personal history of nicotine dependence: Secondary | ICD-10-CM | POA: Insufficient documentation

## 2016-02-18 DIAGNOSIS — J4521 Mild intermittent asthma with (acute) exacerbation: Secondary | ICD-10-CM | POA: Insufficient documentation

## 2016-02-18 DIAGNOSIS — Z7951 Long term (current) use of inhaled steroids: Secondary | ICD-10-CM | POA: Insufficient documentation

## 2016-02-18 DIAGNOSIS — J45901 Unspecified asthma with (acute) exacerbation: Secondary | ICD-10-CM

## 2016-02-18 DIAGNOSIS — E119 Type 2 diabetes mellitus without complications: Secondary | ICD-10-CM | POA: Diagnosis not present

## 2016-02-18 DIAGNOSIS — Z79899 Other long term (current) drug therapy: Secondary | ICD-10-CM | POA: Diagnosis not present

## 2016-02-18 DIAGNOSIS — I10 Essential (primary) hypertension: Secondary | ICD-10-CM | POA: Insufficient documentation

## 2016-02-18 DIAGNOSIS — R0602 Shortness of breath: Secondary | ICD-10-CM | POA: Diagnosis present

## 2016-02-18 MED ORDER — PREDNISONE 50 MG PO TABS
60.0000 mg | ORAL_TABLET | Freq: Once | ORAL | Status: AC
  Administered 2016-02-18: 60 mg via ORAL
  Filled 2016-02-18: qty 1

## 2016-02-18 MED ORDER — PREDNISONE 20 MG PO TABS: ORAL_TABLET | ORAL | 0 refills | Status: DC

## 2016-02-18 MED ORDER — ALBUTEROL SULFATE HFA 108 (90 BASE) MCG/ACT IN AERS: 1.0000 | INHALATION_SPRAY | RESPIRATORY_TRACT | 0 refills | Status: DC | PRN

## 2016-02-18 MED ORDER — ALBUTEROL SULFATE (2.5 MG/3ML) 0.083% IN NEBU
2.5000 mg | INHALATION_SOLUTION | Freq: Once | RESPIRATORY_TRACT | Status: DC
  Filled 2016-02-18: qty 3

## 2016-02-18 MED ORDER — IPRATROPIUM-ALBUTEROL 0.5-2.5 (3) MG/3ML IN SOLN
3.0000 mL | Freq: Once | RESPIRATORY_TRACT | Status: DC
  Filled 2016-02-18: qty 3
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
## 2016-02-18 NOTE — ED Triage Notes (Signed)
Patient reports a five day history of sob, nonproductive cough and wheezes.  History of asthma.

## 2016-04-21 ENCOUNTER — Encounter (HOSPITAL_BASED_OUTPATIENT_CLINIC_OR_DEPARTMENT_OTHER): Payer: Self-pay | Admitting: *Deleted

## 2016-04-21 ENCOUNTER — Emergency Department (HOSPITAL_BASED_OUTPATIENT_CLINIC_OR_DEPARTMENT_OTHER)
Admission: EM | Admit: 2016-04-21 | Discharge: 2016-04-21 | Disposition: A | Discharge status: Home / Self Care | Payer: Medicaid Other | Attending: Emergency Medicine | Admitting: Emergency Medicine

## 2016-04-21 DIAGNOSIS — J45901 Unspecified asthma with (acute) exacerbation: Secondary | ICD-10-CM | POA: Diagnosis not present

## 2016-04-21 DIAGNOSIS — E119 Type 2 diabetes mellitus without complications: Secondary | ICD-10-CM | POA: Insufficient documentation

## 2016-04-21 DIAGNOSIS — I10 Essential (primary) hypertension: Secondary | ICD-10-CM | POA: Insufficient documentation

## 2016-04-21 DIAGNOSIS — Z79899 Other long term (current) drug therapy: Secondary | ICD-10-CM | POA: Insufficient documentation

## 2016-04-21 DIAGNOSIS — R0602 Shortness of breath: Secondary | ICD-10-CM | POA: Diagnosis present

## 2016-04-21 MED ORDER — IPRATROPIUM-ALBUTEROL 0.5-2.5 (3) MG/3ML IN SOLN
3.0000 mL | Freq: Once | RESPIRATORY_TRACT | Status: AC
  Administered 2016-04-21: 3 mL via RESPIRATORY_TRACT

## 2016-04-21 MED ORDER — PREDNISONE 20 MG PO TABS: 40.0000 mg | ORAL_TABLET | Freq: Every day | ORAL | 0 refills | Status: DC

## 2016-04-21 MED ORDER — ALBUTEROL SULFATE (2.5 MG/3ML) 0.083% IN NEBU
2.5000 mg | INHALATION_SOLUTION | Freq: Once | RESPIRATORY_TRACT | Status: AC
  Administered 2016-04-21: 2.5 mg via RESPIRATORY_TRACT

## 2016-04-21 MED ORDER — BUDESONIDE-FORMOTEROL FUMARATE 160-4.5 MCG/ACT IN AERO: 2.0000 | INHALATION_SPRAY | Freq: Every day | RESPIRATORY_TRACT | 0 refills | Status: DC

## 2016-04-21 MED ORDER — PREDNISONE 50 MG PO TABS
60.0000 mg | ORAL_TABLET | Freq: Once | ORAL | Status: AC
  Administered 2016-04-21: 60 mg via ORAL
  Filled 2016-04-21: qty 1

## 2016-04-21 MED ORDER — IPRATROPIUM-ALBUTEROL 0.5-2.5 (3) MG/3ML IN SOLN
3.0000 mL | Freq: Once | RESPIRATORY_TRACT | Status: AC
  Administered 2016-04-21: 3 mL via RESPIRATORY_TRACT
  Filled 2016-04-21: qty 3

## 2016-04-21 MED ORDER — IPRATROPIUM-ALBUTEROL 0.5-2.5 (3) MG/3ML IN SOLN
RESPIRATORY_TRACT | Status: AC
  Administered 2016-04-21: 3 mL via RESPIRATORY_TRACT
  Filled 2016-04-21: qty 3

## 2016-04-21 MED ORDER — ALBUTEROL SULFATE (2.5 MG/3ML) 0.083% IN NEBU
INHALATION_SOLUTION | RESPIRATORY_TRACT | Status: AC
  Administered 2016-04-21: 2.5 mg via RESPIRATORY_TRACT
  Filled 2016-04-21: qty 3

## 2016-04-21 NOTE — ED Triage Notes (Addendum)
Patient states he has a 1.5 week history of increasing sob.  Has been using his Nebulizer and HHA at home with minimal relief.
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
## 2016-04-21 NOTE — Discharge Instructions (Signed)
It was my pleasure taking care of you today!  Take prednisone daily as directed beginning tomorrow morning. I have refilled your Symbicort inhaler for you. Please follow-up with your primary care provider for discussion of today's diagnosis and for further refills of your asthma medication. Return to ER for difficulty breathing, fevers, worsening symptoms, any additional concerns.

## 2016-05-07 ENCOUNTER — Emergency Department (HOSPITAL_BASED_OUTPATIENT_CLINIC_OR_DEPARTMENT_OTHER)
Admission: EM | Admit: 2016-05-07 | Discharge: 2016-05-07 | Disposition: A | Discharge status: Home / Self Care | Payer: Medicaid Other | Attending: Emergency Medicine | Admitting: Emergency Medicine

## 2016-05-07 ENCOUNTER — Encounter (HOSPITAL_BASED_OUTPATIENT_CLINIC_OR_DEPARTMENT_OTHER): Payer: Self-pay | Admitting: Emergency Medicine

## 2016-05-07 DIAGNOSIS — J4541 Moderate persistent asthma with (acute) exacerbation: Secondary | ICD-10-CM | POA: Diagnosis not present

## 2016-05-07 DIAGNOSIS — E119 Type 2 diabetes mellitus without complications: Secondary | ICD-10-CM | POA: Insufficient documentation

## 2016-05-07 DIAGNOSIS — Z79899 Other long term (current) drug therapy: Secondary | ICD-10-CM | POA: Diagnosis not present

## 2016-05-07 DIAGNOSIS — I10 Essential (primary) hypertension: Secondary | ICD-10-CM | POA: Insufficient documentation

## 2016-05-07 DIAGNOSIS — R0602 Shortness of breath: Secondary | ICD-10-CM | POA: Diagnosis present

## 2016-05-07 MED ORDER — PREDNISONE 50 MG PO TABS
ORAL_TABLET | ORAL | Status: AC
  Administered 2016-05-07: 50 mg via ORAL
  Filled 2016-05-07: qty 1

## 2016-05-07 MED ORDER — PREDNISONE 50 MG PO TABS: 60.0000 mg | ORAL_TABLET | Freq: Once | ORAL | Status: DC

## 2016-05-07 MED ORDER — PREDNISONE 10 MG PO TABS
ORAL_TABLET | ORAL | Status: AC
  Administered 2016-05-07: 10 mg via ORAL
  Filled 2016-05-07: qty 1

## 2016-05-07 MED ORDER — PREDNISONE 50 MG PO TABS
50.0000 mg | ORAL_TABLET | Freq: Once | ORAL | Status: AC
Start: 2016-05-07 — End: 2016-05-07
  Administered 2016-05-07: 50 mg via ORAL

## 2016-05-07 MED ORDER — IPRATROPIUM-ALBUTEROL 0.5-2.5 (3) MG/3ML IN SOLN
3.0000 mL | Freq: Once | RESPIRATORY_TRACT | Status: AC
  Administered 2016-05-07: 3 mL via RESPIRATORY_TRACT
  Filled 2016-05-07: qty 3

## 2016-05-07 MED ORDER — PREDNISONE 20 MG PO TABS: ORAL_TABLET | ORAL | 0 refills | Status: DC

## 2016-05-07 MED ORDER — OMEPRAZOLE 20 MG PO CPDR: 20.0000 mg | DELAYED_RELEASE_CAPSULE | Freq: Every day | ORAL | 0 refills | Status: DC

## 2016-05-07 MED ORDER — PREDNISONE 10 MG PO TABS
10.0000 mg | ORAL_TABLET | Freq: Once | ORAL | Status: AC
  Administered 2016-05-07: 10 mg via ORAL

## 2016-05-07 MED ORDER — METHYLPREDNISOLONE SODIUM SUCC 125 MG IJ SOLR: 125.0000 mg | Freq: Once | INTRAMUSCULAR | Status: DC

## 2016-05-07 MED ORDER — ALBUTEROL SULFATE (2.5 MG/3ML) 0.083% IN NEBU
2.5000 mg | INHALATION_SOLUTION | Freq: Once | RESPIRATORY_TRACT | Status: AC
  Administered 2016-05-07: 2.5 mg via RESPIRATORY_TRACT
  Filled 2016-05-07: qty 3

## 2016-05-07 MED FILL — predniSONE 20 MG TABS: 20 | 5 days supply | Qty: 11 | Fill #0

## 2016-05-07 NOTE — ED Triage Notes (Signed)
Non productive cough and sob x 3 days.  No fever.  Pt had nebulizer treatments at home without relief.
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
## 2016-07-03 ENCOUNTER — Emergency Department (HOSPITAL_BASED_OUTPATIENT_CLINIC_OR_DEPARTMENT_OTHER)
Admission: EM | Admit: 2016-07-03 | Discharge: 2016-07-03 | Discharge status: Against Medical Advice | Payer: Medicaid Other | Attending: Emergency Medicine | Admitting: Emergency Medicine

## 2016-07-03 ENCOUNTER — Encounter (HOSPITAL_BASED_OUTPATIENT_CLINIC_OR_DEPARTMENT_OTHER): Payer: Self-pay

## 2016-07-03 DIAGNOSIS — Z79899 Other long term (current) drug therapy: Secondary | ICD-10-CM | POA: Diagnosis not present

## 2016-07-03 DIAGNOSIS — I10 Essential (primary) hypertension: Secondary | ICD-10-CM | POA: Diagnosis not present

## 2016-07-03 DIAGNOSIS — E119 Type 2 diabetes mellitus without complications: Secondary | ICD-10-CM | POA: Insufficient documentation

## 2016-07-03 DIAGNOSIS — J45901 Unspecified asthma with (acute) exacerbation: Secondary | ICD-10-CM

## 2016-07-03 DIAGNOSIS — R0602 Shortness of breath: Secondary | ICD-10-CM | POA: Diagnosis present

## 2016-07-03 LAB — CBC
HCT: 38.9 % — ABNORMAL LOW (ref 39.0–52.0)
Hemoglobin: 12.8 g/dL — ABNORMAL LOW (ref 13.0–17.0)
MCH: 26.8 pg (ref 26.0–34.0)
MCHC: 32.9 g/dL (ref 30.0–36.0)
MCV: 81.6 fL (ref 78.0–100.0)
Platelets: 184 10*3/uL (ref 150–400)
RBC: 4.77 MIL/uL (ref 4.22–5.81)
RDW: 15.1 % (ref 11.5–15.5)
WBC: 7.8 10*3/uL (ref 4.0–10.5)

## 2016-07-03 LAB — BASIC METABOLIC PANEL
Anion gap: 10 (ref 5–15)
BUN: 18 mg/dL (ref 6–20)
CO2: 25 mmol/L (ref 22–32)
Calcium: 9.5 mg/dL (ref 8.9–10.3)
Chloride: 102 mmol/L (ref 101–111)
Creatinine, Ser: 1.09 mg/dL (ref 0.61–1.24)
GFR calc Af Amer: >60 mL/min (ref >60)
GFR calc non Af Amer: >60 mL/min (ref >60)
Glucose, Bld: 116 mg/dL — ABNORMAL HIGH (ref 65–99)
Potassium: 3.5 mmol/L (ref 3.5–5.1)
Sodium: 137 mmol/L (ref 135–145)

## 2016-07-03 MED ORDER — IPRATROPIUM-ALBUTEROL 0.5-2.5 (3) MG/3ML IN SOLN
3.0000 mL | Freq: Once | RESPIRATORY_TRACT | Status: AC
  Administered 2016-07-03: 3 mL via RESPIRATORY_TRACT
  Filled 2016-07-03: qty 3

## 2016-07-03 MED ORDER — PREDNISONE 50 MG PO TABS: 50.0000 mg | ORAL_TABLET | Freq: Every day | ORAL | 0 refills | Status: DC

## 2016-07-03 MED ORDER — ALBUTEROL (5 MG/ML) CONTINUOUS INHALATION SOLN
15.0000 mg/h | INHALATION_SOLUTION | RESPIRATORY_TRACT | Status: AC
  Administered 2016-07-03: 15 mg/h via RESPIRATORY_TRACT
  Filled 2016-07-03: qty 20

## 2016-07-03 MED ORDER — ALBUTEROL SULFATE (2.5 MG/3ML) 0.083% IN NEBU
2.5000 mg | INHALATION_SOLUTION | Freq: Once | RESPIRATORY_TRACT | Status: AC
  Administered 2016-07-03: 2.5 mg via RESPIRATORY_TRACT
  Filled 2016-07-03: qty 3

## 2016-07-03 MED ORDER — METHYLPREDNISOLONE SODIUM SUCC 125 MG IJ SOLR
125.0000 mg | Freq: Once | INTRAMUSCULAR | Status: AC
  Administered 2016-07-03: 125 mg via INTRAVENOUS
  Filled 2016-07-03: qty 2

## 2016-07-03 NOTE — Discharge Instructions (Signed)
Return to the ED with any concerns including difficulty breathing despite using albuterol every 4 hours, not drinking fluids, decreased urine output, vomiting and not able to keep down liquids or medications, decreased level of alertness/lethargy, or any other alarming symptoms °

## 2016-07-03 NOTE — ED Triage Notes (Signed)
Pt c/o asthma/SOB x3day, states used inhaler and neb prior to arrival;
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
## 2016-07-15 ENCOUNTER — Encounter (HOSPITAL_BASED_OUTPATIENT_CLINIC_OR_DEPARTMENT_OTHER): Payer: Self-pay | Admitting: *Deleted

## 2016-07-15 DIAGNOSIS — E119 Type 2 diabetes mellitus without complications: Secondary | ICD-10-CM | POA: Insufficient documentation

## 2016-07-15 DIAGNOSIS — I10 Essential (primary) hypertension: Secondary | ICD-10-CM | POA: Insufficient documentation

## 2016-07-15 DIAGNOSIS — J45909 Unspecified asthma, uncomplicated: Secondary | ICD-10-CM | POA: Insufficient documentation

## 2016-07-15 DIAGNOSIS — Y929 Unspecified place or not applicable: Secondary | ICD-10-CM | POA: Insufficient documentation

## 2016-07-15 DIAGNOSIS — S0181XA Laceration without foreign body of other part of head, initial encounter: Secondary | ICD-10-CM | POA: Insufficient documentation

## 2016-07-15 DIAGNOSIS — Y999 Unspecified external cause status: Secondary | ICD-10-CM

## 2016-07-15 DIAGNOSIS — Y939 Activity, unspecified: Secondary | ICD-10-CM | POA: Insufficient documentation

## 2016-07-15 DIAGNOSIS — X58XXXA Exposure to other specified factors, initial encounter: Secondary | ICD-10-CM

## 2016-07-15 DIAGNOSIS — S0990XA Unspecified injury of head, initial encounter: Secondary | ICD-10-CM | POA: Diagnosis present

## 2016-07-15 DIAGNOSIS — Z79899 Other long term (current) drug therapy: Secondary | ICD-10-CM | POA: Insufficient documentation

## 2016-07-15 DIAGNOSIS — S0185XA Open bite of other part of head, initial encounter: Secondary | ICD-10-CM | POA: Diagnosis not present
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
## 2016-07-16 ENCOUNTER — Emergency Department (HOSPITAL_COMMUNITY)
Admission: EM | Admit: 2016-07-16 | Discharge: 2016-07-16 | Disposition: A | Discharge status: Home / Self Care | Payer: Medicaid Other | Attending: Emergency Medicine | Admitting: Emergency Medicine

## 2016-07-16 ENCOUNTER — Emergency Department (HOSPITAL_BASED_OUTPATIENT_CLINIC_OR_DEPARTMENT_OTHER)
Admission: EM | Admit: 2016-07-16 | Discharge: 2016-07-16 | Disposition: A | Discharge status: Home / Self Care | Payer: Medicaid Other | Source: Home / Self Care

## 2016-07-16 ENCOUNTER — Encounter (HOSPITAL_COMMUNITY): Payer: Self-pay

## 2016-07-16 DIAGNOSIS — S0990XA Unspecified injury of head, initial encounter: Secondary | ICD-10-CM

## 2016-07-16 DIAGNOSIS — W503XXA Accidental bite by another person, initial encounter: Secondary | ICD-10-CM

## 2016-07-16 MED ORDER — HYDROCODONE-ACETAMINOPHEN 5-325 MG PO TABS
1.0000 | ORAL_TABLET | Freq: Once | ORAL | Status: AC
  Administered 2016-07-16: 1 via ORAL
  Filled 2016-07-16: qty 1

## 2016-07-16 MED ORDER — AMOXICILLIN-POT CLAVULANATE 875-125 MG PO TABS
1.0000 | ORAL_TABLET | Freq: Once | ORAL | Status: AC
  Administered 2016-07-16: 1 via ORAL
  Filled 2016-07-16: qty 1

## 2016-07-16 MED ORDER — AMOXICILLIN-POT CLAVULANATE 875-125 MG PO TABS: 1.0000 | ORAL_TABLET | Freq: Two times a day (BID) | ORAL | 0 refills | Status: DC

## 2016-07-16 MED ORDER — ALBUTEROL SULFATE HFA 108 (90 BASE) MCG/ACT IN AERS
2.0000 | INHALATION_SPRAY | Freq: Once | RESPIRATORY_TRACT | Status: AC
  Administered 2016-07-16: 2 via RESPIRATORY_TRACT
  Filled 2016-07-16: qty 6.7

## 2016-07-16 MED ORDER — BACITRACIN ZINC 500 UNIT/GM EX OINT
TOPICAL_OINTMENT | Freq: Once | CUTANEOUS | Status: AC
  Administered 2016-07-16: 1 via TOPICAL
  Filled 2016-07-16: qty 0.9
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
## 2016-07-16 NOTE — Discharge Instructions (Signed)
Please avoid alcohol for the next week.  Please rest and drink plenty of water.  We recommend that you avoid any activity that may lead to another head injury for at least 1 week or until your symptoms have completely resolved.  We also recommend "brain rest" - please avoid TV, cell phones, tablets, computers as much as possible for the next 48 hours.     You may alternate between Tylenol 1000 mg every 6 hours as needed for pain and ibuprofen 800 mg every 8 hours as needed for pain. Please take your antibiotics until they are complete. You may clean this area with warm soap and water and apply Neosporin ointment twice a day.
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
## 2016-08-13 ENCOUNTER — Encounter (HOSPITAL_BASED_OUTPATIENT_CLINIC_OR_DEPARTMENT_OTHER): Payer: Self-pay

## 2016-08-13 ENCOUNTER — Emergency Department (HOSPITAL_BASED_OUTPATIENT_CLINIC_OR_DEPARTMENT_OTHER)
Admission: EM | Admit: 2016-08-13 | Discharge: 2016-08-13 | Disposition: A | Discharge status: Home / Self Care | Payer: Medicaid Other | Attending: Emergency Medicine | Admitting: Emergency Medicine

## 2016-08-13 ENCOUNTER — Emergency Department (HOSPITAL_BASED_OUTPATIENT_CLINIC_OR_DEPARTMENT_OTHER)
Admission: EM | Admit: 2016-08-13 | Discharge: 2016-08-13 | Disposition: A | Discharge status: Home / Self Care | Payer: Medicaid Other | Source: Home / Self Care | Attending: Emergency Medicine | Admitting: Emergency Medicine

## 2016-08-13 DIAGNOSIS — J45909 Unspecified asthma, uncomplicated: Secondary | ICD-10-CM | POA: Diagnosis not present

## 2016-08-13 DIAGNOSIS — Z79899 Other long term (current) drug therapy: Secondary | ICD-10-CM | POA: Diagnosis not present

## 2016-08-13 DIAGNOSIS — Z23 Encounter for immunization: Secondary | ICD-10-CM | POA: Diagnosis not present

## 2016-08-13 DIAGNOSIS — I10 Essential (primary) hypertension: Secondary | ICD-10-CM | POA: Diagnosis not present

## 2016-08-13 DIAGNOSIS — T7840XA Allergy, unspecified, initial encounter: Secondary | ICD-10-CM | POA: Diagnosis not present

## 2016-08-13 MED ORDER — DEXAMETHASONE SODIUM PHOSPHATE 10 MG/ML IJ SOLN
10.0000 mg | Freq: Once | INTRAMUSCULAR | Status: AC
  Administered 2016-08-13: 10 mg via INTRAMUSCULAR
  Filled 2016-08-13: qty 1

## 2016-08-13 MED ORDER — DIPHENHYDRAMINE HCL 25 MG PO CAPS
25.0000 mg | ORAL_CAPSULE | Freq: Once | ORAL | Status: AC
  Administered 2016-08-13: 25 mg via ORAL
  Filled 2016-08-13: qty 1

## 2016-08-13 MED ORDER — TETANUS-DIPHTH-ACELL PERTUSSIS 5-2.5-18.5 LF-MCG/0.5 IM SUSP
0.5000 mL | Freq: Once | INTRAMUSCULAR | Status: AC
  Administered 2016-08-13: 0.5 mL via INTRAMUSCULAR
  Filled 2016-08-13: qty 0.5
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
## 2016-08-13 NOTE — ED Notes (Signed)
ED Provider at bedside. 
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
## 2016-08-13 NOTE — Discharge Instructions (Signed)
Benadryl and Pepcid as needed for allergic reaction. Keep area clean and dry.  Continue using inhalers as needed for asthma.  Follow up with your primary care physician in 2 days for recheck.  Return to ER for new or worsening symptoms, any additional concerns.

## 2016-09-08 ENCOUNTER — Encounter (HOSPITAL_BASED_OUTPATIENT_CLINIC_OR_DEPARTMENT_OTHER): Payer: Self-pay | Admitting: *Deleted

## 2016-09-08 ENCOUNTER — Emergency Department (HOSPITAL_BASED_OUTPATIENT_CLINIC_OR_DEPARTMENT_OTHER)
Admission: EM | Admit: 2016-09-08 | Discharge: 2016-09-08 | Disposition: A | Discharge status: Home / Self Care | Payer: Medicaid Other | Attending: Physician Assistant | Admitting: Physician Assistant

## 2016-09-08 DIAGNOSIS — Z7984 Long term (current) use of oral hypoglycemic drugs: Secondary | ICD-10-CM | POA: Insufficient documentation

## 2016-09-08 DIAGNOSIS — S60512A Abrasion of left hand, initial encounter: Secondary | ICD-10-CM

## 2016-09-08 DIAGNOSIS — J45909 Unspecified asthma, uncomplicated: Secondary | ICD-10-CM | POA: Insufficient documentation

## 2016-09-08 DIAGNOSIS — X58XXXD Exposure to other specified factors, subsequent encounter: Secondary | ICD-10-CM | POA: Insufficient documentation

## 2016-09-08 DIAGNOSIS — L089 Local infection of the skin and subcutaneous tissue, unspecified: Secondary | ICD-10-CM | POA: Diagnosis not present

## 2016-09-08 DIAGNOSIS — S6992XD Unspecified injury of left wrist, hand and finger(s), subsequent encounter: Secondary | ICD-10-CM | POA: Diagnosis present

## 2016-09-08 DIAGNOSIS — S60512D Abrasion of left hand, subsequent encounter: Secondary | ICD-10-CM | POA: Diagnosis not present

## 2016-09-08 DIAGNOSIS — E119 Type 2 diabetes mellitus without complications: Secondary | ICD-10-CM | POA: Insufficient documentation

## 2016-09-08 DIAGNOSIS — I10 Essential (primary) hypertension: Secondary | ICD-10-CM | POA: Insufficient documentation

## 2016-09-08 DIAGNOSIS — Z79899 Other long term (current) drug therapy: Secondary | ICD-10-CM | POA: Diagnosis not present

## 2016-09-08 LAB — CBG MONITORING, ED: Glucose-Capillary: 187 mg/dL — ABNORMAL HIGH (ref 65–99)

## 2016-09-08 MED ORDER — CEPHALEXIN 500 MG PO CAPS: 500.0000 mg | ORAL_CAPSULE | Freq: Three times a day (TID) | ORAL | 0 refills | Status: DC

## 2016-09-08 NOTE — ED Triage Notes (Signed)
Patient states he has a one month history of a skin tear on the dorsal left hand.  States the wound will not heal and he feels like it is infected.

## 2016-09-08 NOTE — Discharge Instructions (Signed)
Please take all of your antibiotics until finished!  Please call the wound center on Monday morning to schedule a follow up appointment.  Return to ER for new or worsening symptoms, any additional concerns.
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
## 2016-09-20 ENCOUNTER — Encounter (HOSPITAL_BASED_OUTPATIENT_CLINIC_OR_DEPARTMENT_OTHER): Payer: Medicaid Other | Attending: Internal Medicine

## 2016-10-24 ENCOUNTER — Emergency Department (HOSPITAL_BASED_OUTPATIENT_CLINIC_OR_DEPARTMENT_OTHER)
Admission: EM | Admit: 2016-10-24 | Discharge: 2016-10-24 | Disposition: A | Discharge status: Home / Self Care | Payer: Medicaid Other | Attending: Emergency Medicine | Admitting: Emergency Medicine

## 2016-10-24 ENCOUNTER — Encounter (HOSPITAL_BASED_OUTPATIENT_CLINIC_OR_DEPARTMENT_OTHER): Payer: Self-pay | Admitting: Emergency Medicine

## 2016-10-24 DIAGNOSIS — Z79899 Other long term (current) drug therapy: Secondary | ICD-10-CM | POA: Insufficient documentation

## 2016-10-24 DIAGNOSIS — E119 Type 2 diabetes mellitus without complications: Secondary | ICD-10-CM | POA: Insufficient documentation

## 2016-10-24 DIAGNOSIS — I10 Essential (primary) hypertension: Secondary | ICD-10-CM | POA: Insufficient documentation

## 2016-10-24 DIAGNOSIS — J4521 Mild intermittent asthma with (acute) exacerbation: Secondary | ICD-10-CM | POA: Diagnosis not present

## 2016-10-24 DIAGNOSIS — R05 Cough: Secondary | ICD-10-CM | POA: Diagnosis present

## 2016-10-24 MED ORDER — BUDESONIDE-FORMOTEROL FUMARATE 160-4.5 MCG/ACT IN AERO: 2.0000 | INHALATION_SPRAY | Freq: Every day | RESPIRATORY_TRACT | 0 refills | Status: DC

## 2016-10-24 MED ORDER — PREDNISONE 50 MG PO TABS
60.0000 mg | ORAL_TABLET | Freq: Once | ORAL | Status: AC
  Administered 2016-10-24: 60 mg via ORAL
  Filled 2016-10-24: qty 1

## 2016-10-24 MED ORDER — PREDNISONE 50 MG PO TABS: 50.0000 mg | ORAL_TABLET | Freq: Every day | ORAL | 0 refills | Status: DC

## 2016-10-24 MED ORDER — IPRATROPIUM-ALBUTEROL 0.5-2.5 (3) MG/3ML IN SOLN
RESPIRATORY_TRACT | Status: AC
  Administered 2016-10-24: 9 mL
  Filled 2016-10-24: qty 9

## 2016-10-24 MED ORDER — ALBUTEROL SULFATE (2.5 MG/3ML) 0.083% IN NEBU
2.5000 mg | INHALATION_SOLUTION | Freq: Once | RESPIRATORY_TRACT | Status: AC
  Administered 2016-10-24: 2.5 mg via RESPIRATORY_TRACT
  Filled 2016-10-24: qty 3

## 2016-10-24 MED FILL — predniSONE 50 MG TABS: 50 | 5 days supply | Qty: 5 | Fill #0

## 2016-10-24 MED FILL — SYMBICORT 160-4.5 MCG INH: 160-4.5 | 30 days supply | Qty: 10 | Fill #0

## 2016-10-24 NOTE — ED Triage Notes (Signed)
Pt having flare of asthma.  DOE, expiratory wheezes. 94% sat on room air.
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
## 2016-10-24 NOTE — Discharge Instructions (Signed)
Please read attached information. If you experience any new or worsening signs or symptoms please return to the emergency room for evaluation. Please follow-up with your primary care provider or specialist as discussed. Please use medication prescribed only as directed and discontinue taking if you have any concerning signs or symptoms.   °

## 2017-01-30 ENCOUNTER — Encounter (HOSPITAL_BASED_OUTPATIENT_CLINIC_OR_DEPARTMENT_OTHER): Payer: Self-pay | Admitting: *Deleted

## 2017-01-30 ENCOUNTER — Emergency Department (HOSPITAL_BASED_OUTPATIENT_CLINIC_OR_DEPARTMENT_OTHER): Payer: Medicaid Other

## 2017-01-30 ENCOUNTER — Emergency Department (HOSPITAL_BASED_OUTPATIENT_CLINIC_OR_DEPARTMENT_OTHER)
Admission: EM | Admit: 2017-01-30 | Discharge: 2017-01-30 | Disposition: A | Discharge status: Home / Self Care | Payer: Medicaid Other | Attending: Emergency Medicine | Admitting: Emergency Medicine

## 2017-01-30 DIAGNOSIS — E119 Type 2 diabetes mellitus without complications: Secondary | ICD-10-CM | POA: Diagnosis not present

## 2017-01-30 DIAGNOSIS — I10 Essential (primary) hypertension: Secondary | ICD-10-CM | POA: Diagnosis not present

## 2017-01-30 DIAGNOSIS — J4541 Moderate persistent asthma with (acute) exacerbation: Secondary | ICD-10-CM | POA: Diagnosis not present

## 2017-01-30 DIAGNOSIS — Z79899 Other long term (current) drug therapy: Secondary | ICD-10-CM | POA: Diagnosis not present

## 2017-01-30 DIAGNOSIS — J45909 Unspecified asthma, uncomplicated: Secondary | ICD-10-CM | POA: Diagnosis present

## 2017-01-30 IMAGING — CR DG CHEST 2V
2 series · 2 of 2 positions shown · non-contrast
Comparison: [DATE] .

CLINICAL DATA: Asthma exacerbation.

EXAM:
CHEST  2 VIEW

[w chest pa *]
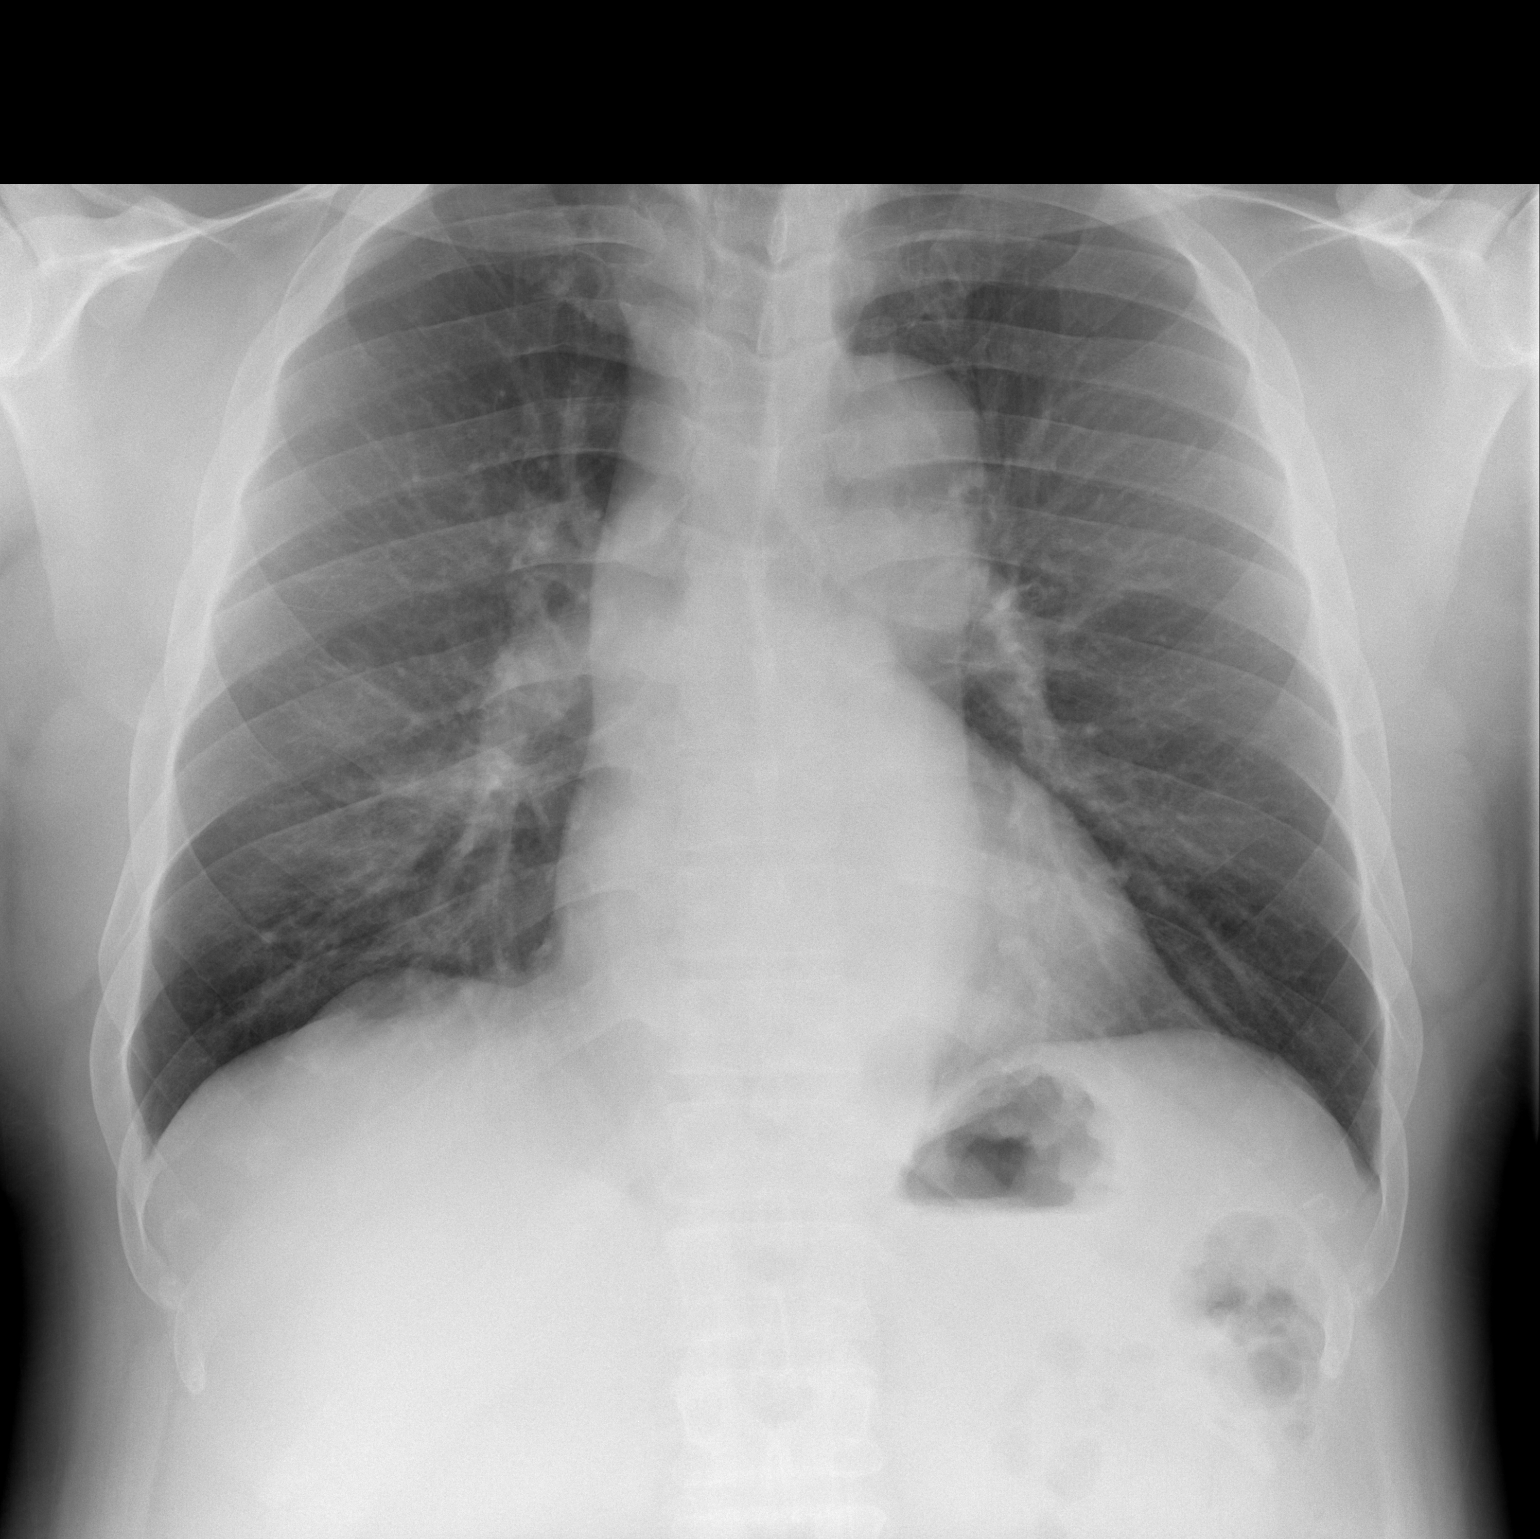

[w chest lat *]
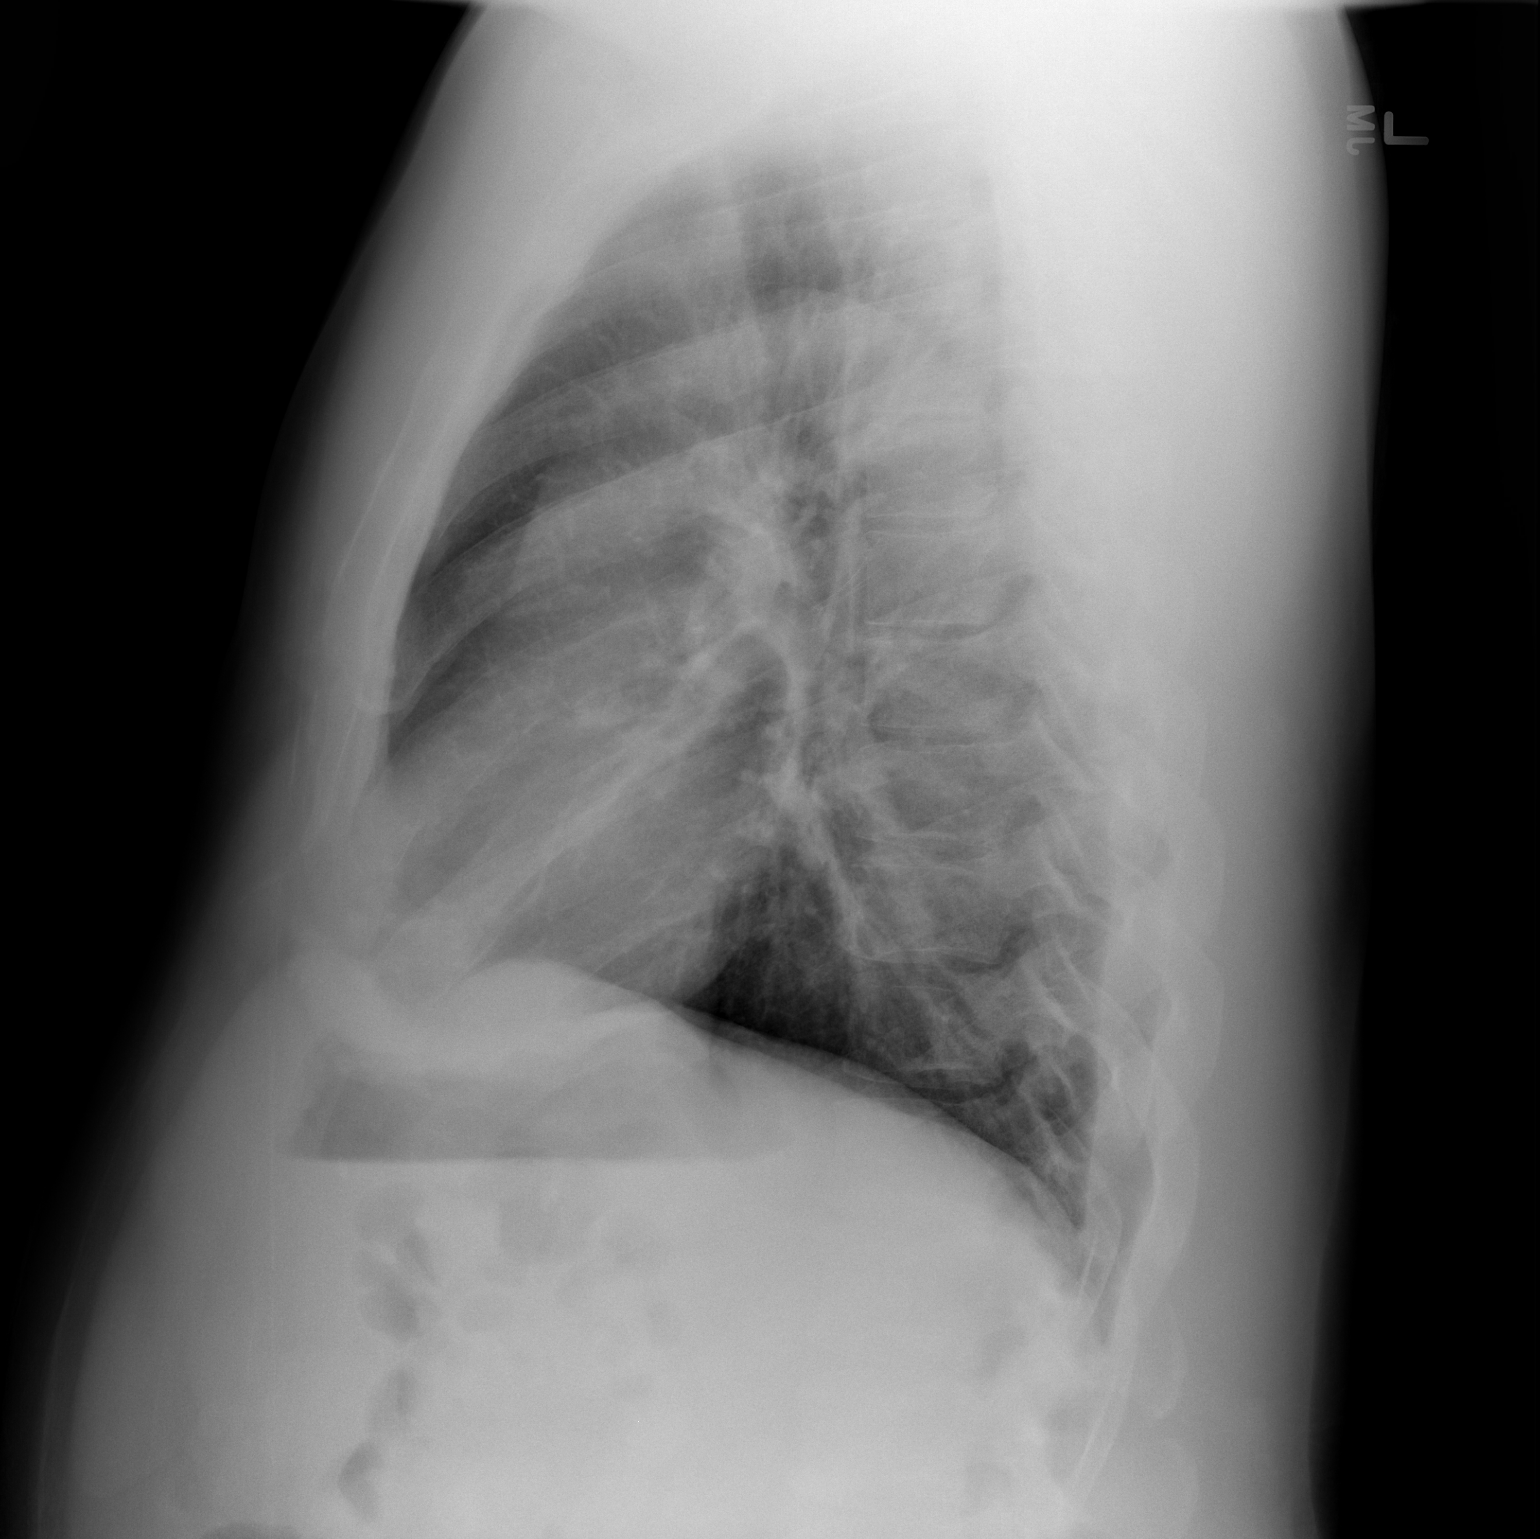

[2 of 2 positions shown; findings below may reference images not displayed]

FINDINGS: Mediastinum and hilar structures normal. Lungs are clear. No pleural
effusion or pneumothorax. Heart size normal.
IMPRESSION: No acute cardiopulmonary disease .

## 2017-01-30 MED ORDER — ALBUTEROL SULFATE (2.5 MG/3ML) 0.083% IN NEBU
2.5000 mg | INHALATION_SOLUTION | Freq: Once | RESPIRATORY_TRACT | Status: AC
  Administered 2017-01-30: 2.5 mg via RESPIRATORY_TRACT
  Filled 2017-01-30: qty 3

## 2017-01-30 MED ORDER — ALBUTEROL SULFATE (2.5 MG/3ML) 0.083% IN NEBU
5.0000 mg | INHALATION_SOLUTION | Freq: Once | RESPIRATORY_TRACT | Status: AC
  Administered 2017-01-30: 5 mg via RESPIRATORY_TRACT
  Filled 2017-01-30: qty 6

## 2017-01-30 MED ORDER — PREDNISONE 50 MG PO TABS
60.0000 mg | ORAL_TABLET | Freq: Once | ORAL | Status: AC
  Administered 2017-01-30: 60 mg via ORAL
  Filled 2017-01-30: qty 1

## 2017-01-30 MED ORDER — BUDESONIDE-FORMOTEROL FUMARATE 160-4.5 MCG/ACT IN AERO: 2.0000 | INHALATION_SPRAY | Freq: Every day | RESPIRATORY_TRACT | 1 refills | Status: DC

## 2017-01-30 MED ORDER — IPRATROPIUM-ALBUTEROL 0.5-2.5 (3) MG/3ML IN SOLN
3.0000 mL | Freq: Once | RESPIRATORY_TRACT | Status: AC
  Administered 2017-01-30: 3 mL via RESPIRATORY_TRACT
  Filled 2017-01-30: qty 3

## 2017-01-30 MED ORDER — PREDNISONE 20 MG PO TABS: 60.0000 mg | ORAL_TABLET | Freq: Every day | ORAL | 0 refills | Status: DC

## 2017-01-30 MED ORDER — IPRATROPIUM BROMIDE 0.02 % IN SOLN
0.5000 mg | Freq: Once | RESPIRATORY_TRACT | Status: AC
  Administered 2017-01-30: 0.5 mg via RESPIRATORY_TRACT
  Filled 2017-01-30: qty 2.5

## 2017-01-30 MED ORDER — ALBUTEROL SULFATE HFA 108 (90 BASE) MCG/ACT IN AERS: 2.0000 | INHALATION_SPRAY | RESPIRATORY_TRACT | 3 refills | Status: DC | PRN

## 2017-01-30 MED FILL — PROAIR HFA 90 MCG INHALER: 108 (90 BAS | 25 days supply | Qty: 9 | Fill #0

## 2017-01-30 MED FILL — SYMBICORT 160-4.5 MCG INH: 160-4.5 | 34 days supply | Qty: 10 | Fill #0

## 2017-01-30 MED FILL — predniSONE 20 MG TABS: 20 | 4 days supply | Qty: 12 | Fill #0

## 2017-01-30 NOTE — ED Notes (Signed)
Patient transported to X-ray 
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
## 2017-01-30 NOTE — Discharge Instructions (Signed)
It was our pleasure to provide your ER care today - we hope that you feel better.  Rest. Drink adequate fluids.  Take prednisone as prescribed. Use inhalers as prescribed, and albuterol as need if wheezing.  Follow up with primary care doctor for recheck in the next couple days if symptoms fail to improve/resolve.  Also follow up with your doctor for your blood pressure in next couple weeks as it is high today.  Return to ER if worse, increased trouble breathing, other concern.

## 2017-01-30 NOTE — ED Triage Notes (Signed)
Pt reports asthma exacerbation x5days -- reports inhalers have stopped working. Reports dry cough. Denies fever, n/v. Last nebulizer tx around 0700. States he's out of his Symbicort.

## 2017-03-18 ENCOUNTER — Encounter (HOSPITAL_BASED_OUTPATIENT_CLINIC_OR_DEPARTMENT_OTHER): Payer: Self-pay | Admitting: Emergency Medicine

## 2017-03-18 ENCOUNTER — Emergency Department (HOSPITAL_BASED_OUTPATIENT_CLINIC_OR_DEPARTMENT_OTHER): Payer: Medicaid Other

## 2017-03-18 ENCOUNTER — Emergency Department (HOSPITAL_BASED_OUTPATIENT_CLINIC_OR_DEPARTMENT_OTHER)
Admission: EM | Admit: 2017-03-18 | Discharge: 2017-03-18 | Disposition: A | Discharge status: Home / Self Care | Payer: Medicaid Other | Attending: Emergency Medicine | Admitting: Emergency Medicine

## 2017-03-18 DIAGNOSIS — Y929 Unspecified place or not applicable: Secondary | ICD-10-CM | POA: Insufficient documentation

## 2017-03-18 DIAGNOSIS — J45909 Unspecified asthma, uncomplicated: Secondary | ICD-10-CM | POA: Diagnosis not present

## 2017-03-18 DIAGNOSIS — E119 Type 2 diabetes mellitus without complications: Secondary | ICD-10-CM | POA: Diagnosis not present

## 2017-03-18 DIAGNOSIS — S63652A Sprain of metacarpophalangeal joint of right middle finger, initial encounter: Secondary | ICD-10-CM

## 2017-03-18 DIAGNOSIS — Z79899 Other long term (current) drug therapy: Secondary | ICD-10-CM | POA: Insufficient documentation

## 2017-03-18 DIAGNOSIS — S6991XA Unspecified injury of right wrist, hand and finger(s), initial encounter: Secondary | ICD-10-CM | POA: Diagnosis present

## 2017-03-18 DIAGNOSIS — I10 Essential (primary) hypertension: Secondary | ICD-10-CM | POA: Insufficient documentation

## 2017-03-18 DIAGNOSIS — X58XXXA Exposure to other specified factors, initial encounter: Secondary | ICD-10-CM | POA: Insufficient documentation

## 2017-03-18 DIAGNOSIS — Y998 Other external cause status: Secondary | ICD-10-CM | POA: Insufficient documentation

## 2017-03-18 DIAGNOSIS — Y9383 Activity, rough housing and horseplay: Secondary | ICD-10-CM | POA: Diagnosis not present

## 2017-03-18 IMAGING — DX DG HAND COMPLETE 3+V*R*
3 series · 3 of 3 positions shown · non-contrast
Comparison: Radiographs [DATE].

CLINICAL DATA: Right hand pain after wrestling injury 2 weeks ago.

EXAM:
RIGHT HAND - COMPLETE 3+ VIEW

[hand pa]
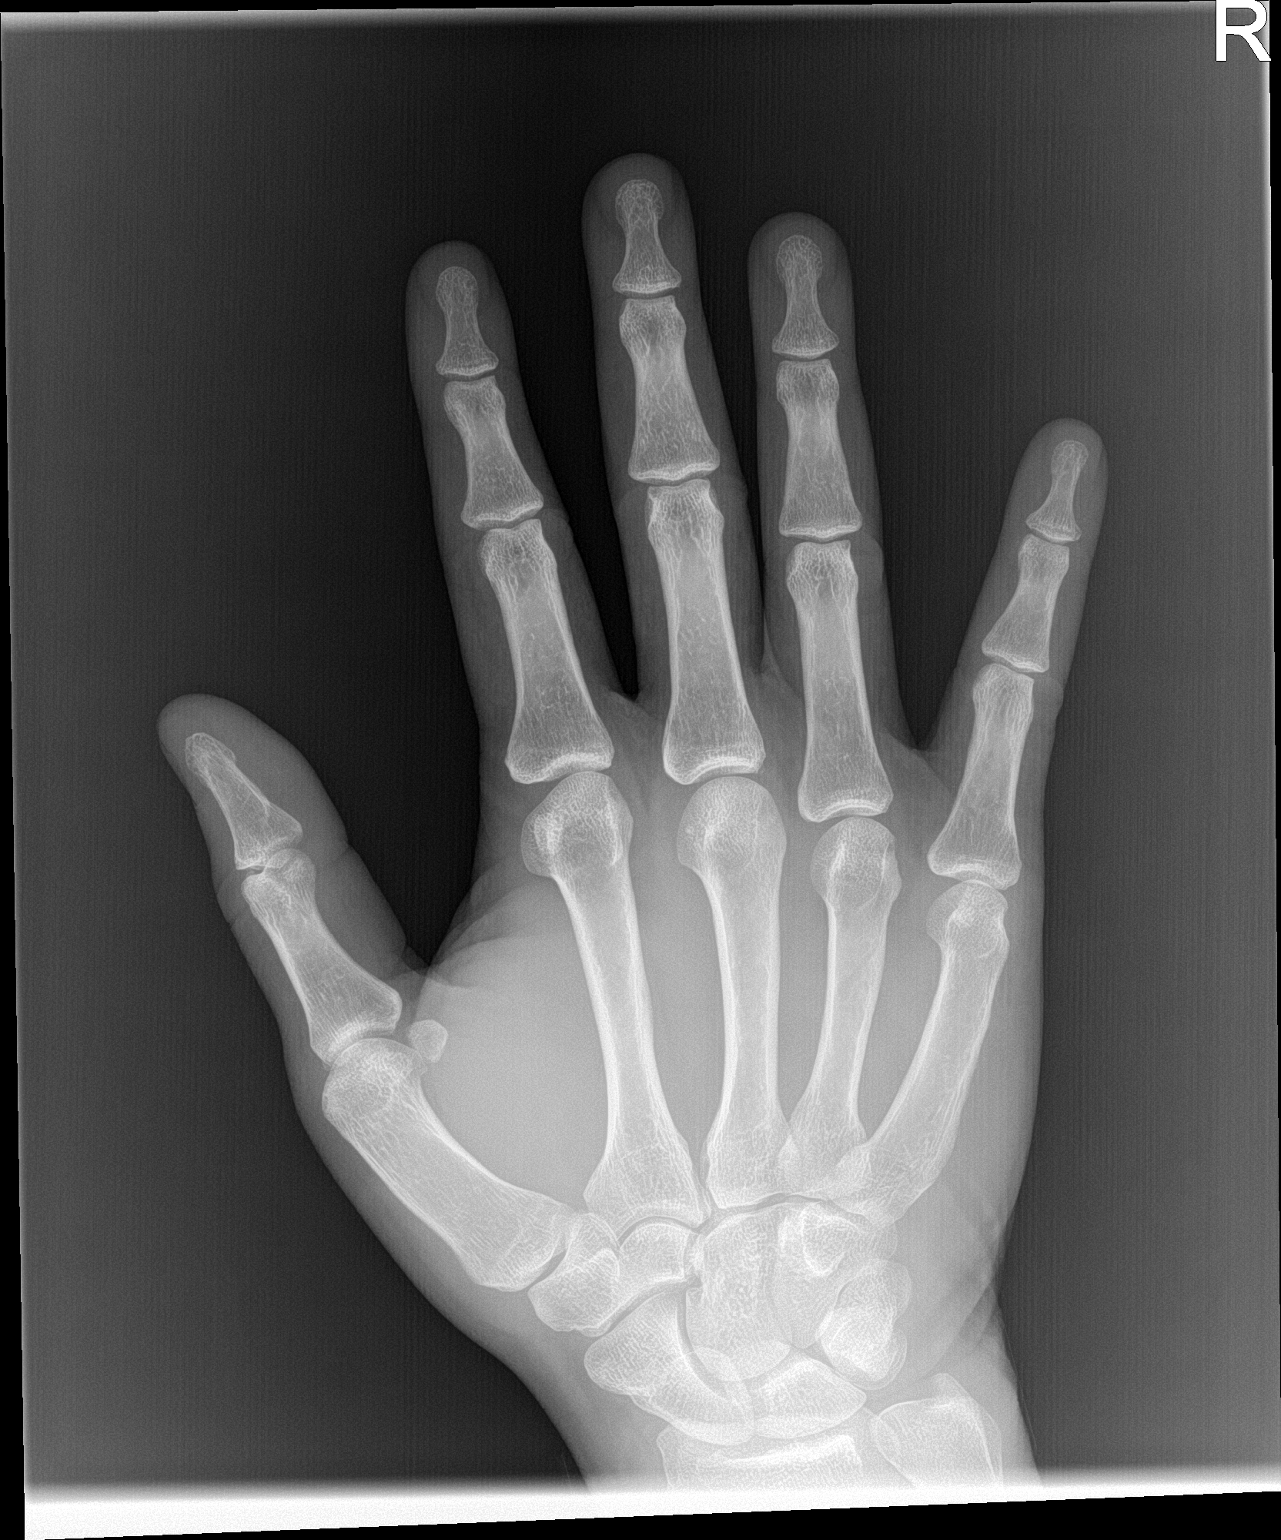

[hand obl]
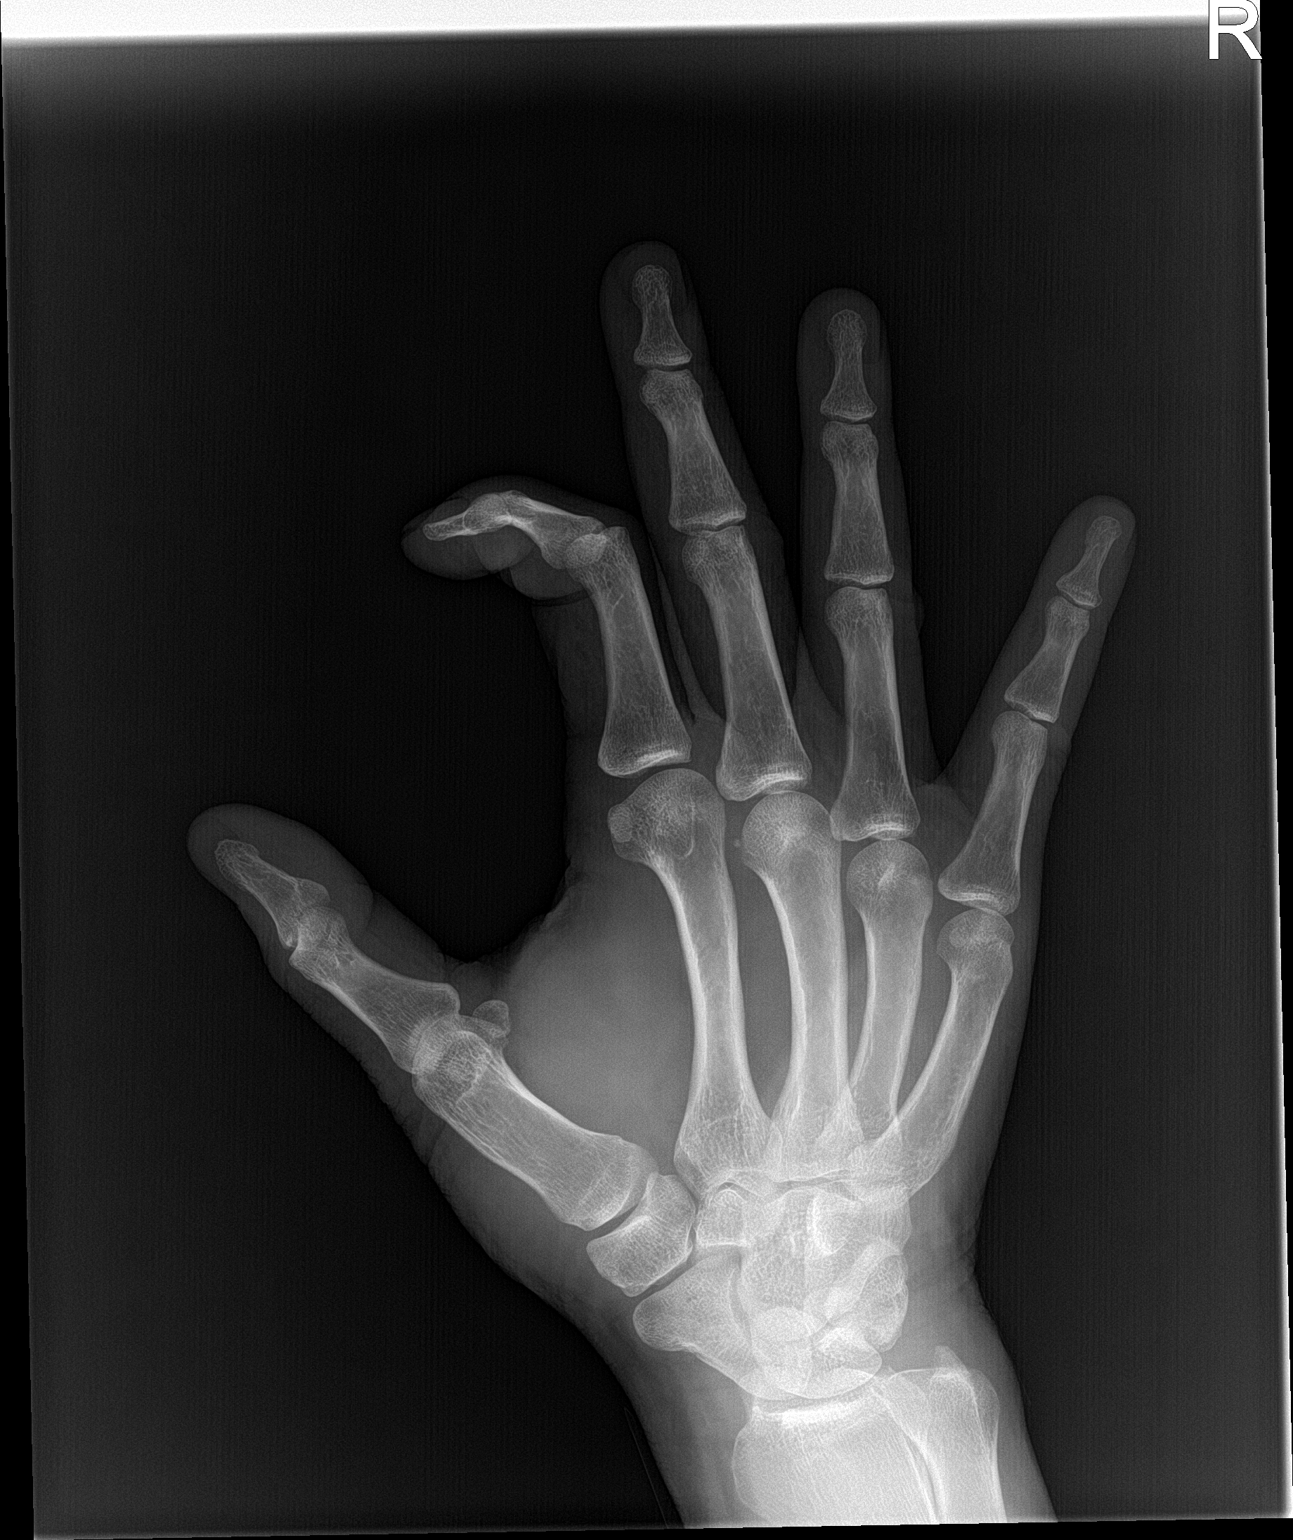

[hand lat]
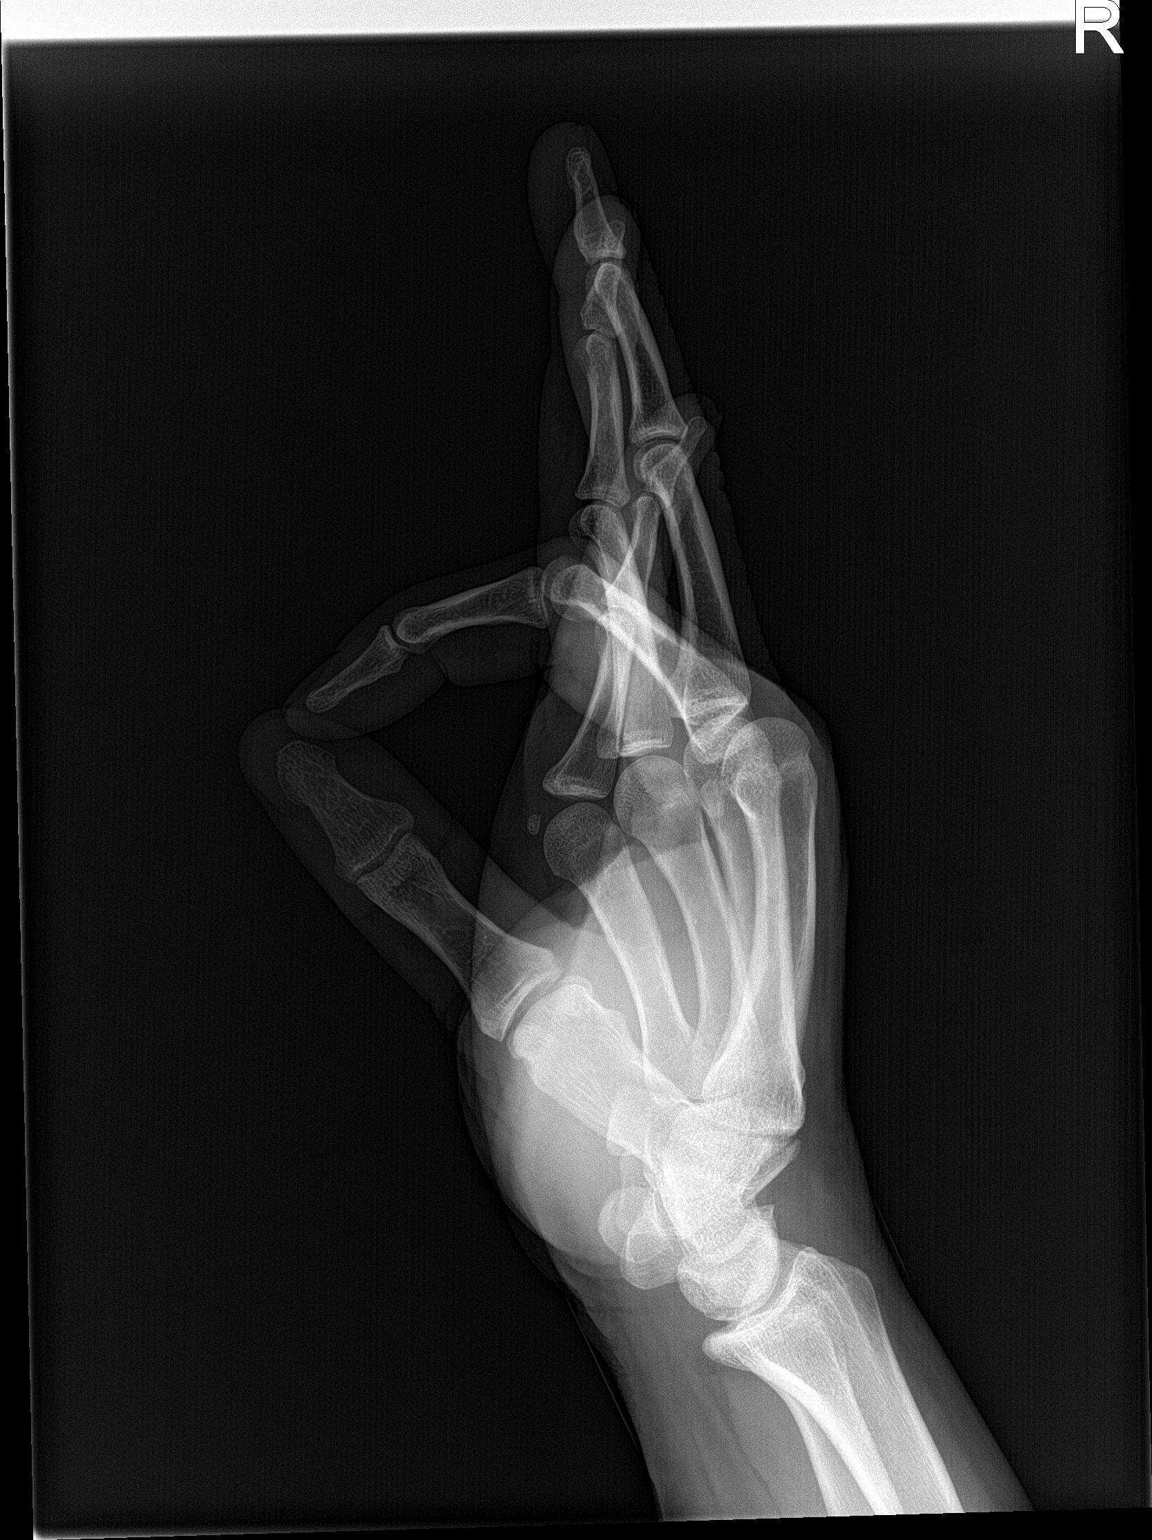

[3 of 3 positions shown; findings below may reference images not displayed]

FINDINGS: There is no evidence of fracture or dislocation. There is no
evidence of arthropathy or other focal bone abnormality. Soft
tissues are unremarkable.
IMPRESSION: Normal right hand.

## 2017-03-18 NOTE — Discharge Instructions (Signed)
Please read attached information. If you experience any new or worsening signs or symptoms please return to the emergency room for evaluation. Please follow-up with your primary care provider or specialist as discussed.  °

## 2017-03-18 NOTE — ED Triage Notes (Addendum)
Patient states that he was wrestling with a friend and he heard his right hand pop 2 weeks ago, patient states that it still hurts
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
## 2017-03-29 ENCOUNTER — Encounter (HOSPITAL_BASED_OUTPATIENT_CLINIC_OR_DEPARTMENT_OTHER): Payer: Self-pay | Admitting: *Deleted

## 2017-03-29 ENCOUNTER — Emergency Department (HOSPITAL_BASED_OUTPATIENT_CLINIC_OR_DEPARTMENT_OTHER)
Admission: EM | Admit: 2017-03-29 | Discharge: 2017-03-29 | Disposition: A | Discharge status: Home / Self Care | Payer: Medicaid Other | Attending: Emergency Medicine | Admitting: Emergency Medicine

## 2017-03-29 ENCOUNTER — Emergency Department (HOSPITAL_BASED_OUTPATIENT_CLINIC_OR_DEPARTMENT_OTHER): Payer: Medicaid Other

## 2017-03-29 DIAGNOSIS — E119 Type 2 diabetes mellitus without complications: Secondary | ICD-10-CM | POA: Diagnosis not present

## 2017-03-29 DIAGNOSIS — R062 Wheezing: Secondary | ICD-10-CM

## 2017-03-29 DIAGNOSIS — Z79899 Other long term (current) drug therapy: Secondary | ICD-10-CM | POA: Insufficient documentation

## 2017-03-29 DIAGNOSIS — I10 Essential (primary) hypertension: Secondary | ICD-10-CM | POA: Insufficient documentation

## 2017-03-29 DIAGNOSIS — J4541 Moderate persistent asthma with (acute) exacerbation: Secondary | ICD-10-CM | POA: Diagnosis not present

## 2017-03-29 IMAGING — DX DG CHEST 2V
2 series · 2 of 2 positions shown · non-contrast
Comparison: [DATE]

CLINICAL DATA: Cough and wheezing for several days, initial
encounter

EXAM:
CHEST  2 VIEW

[chest pa]
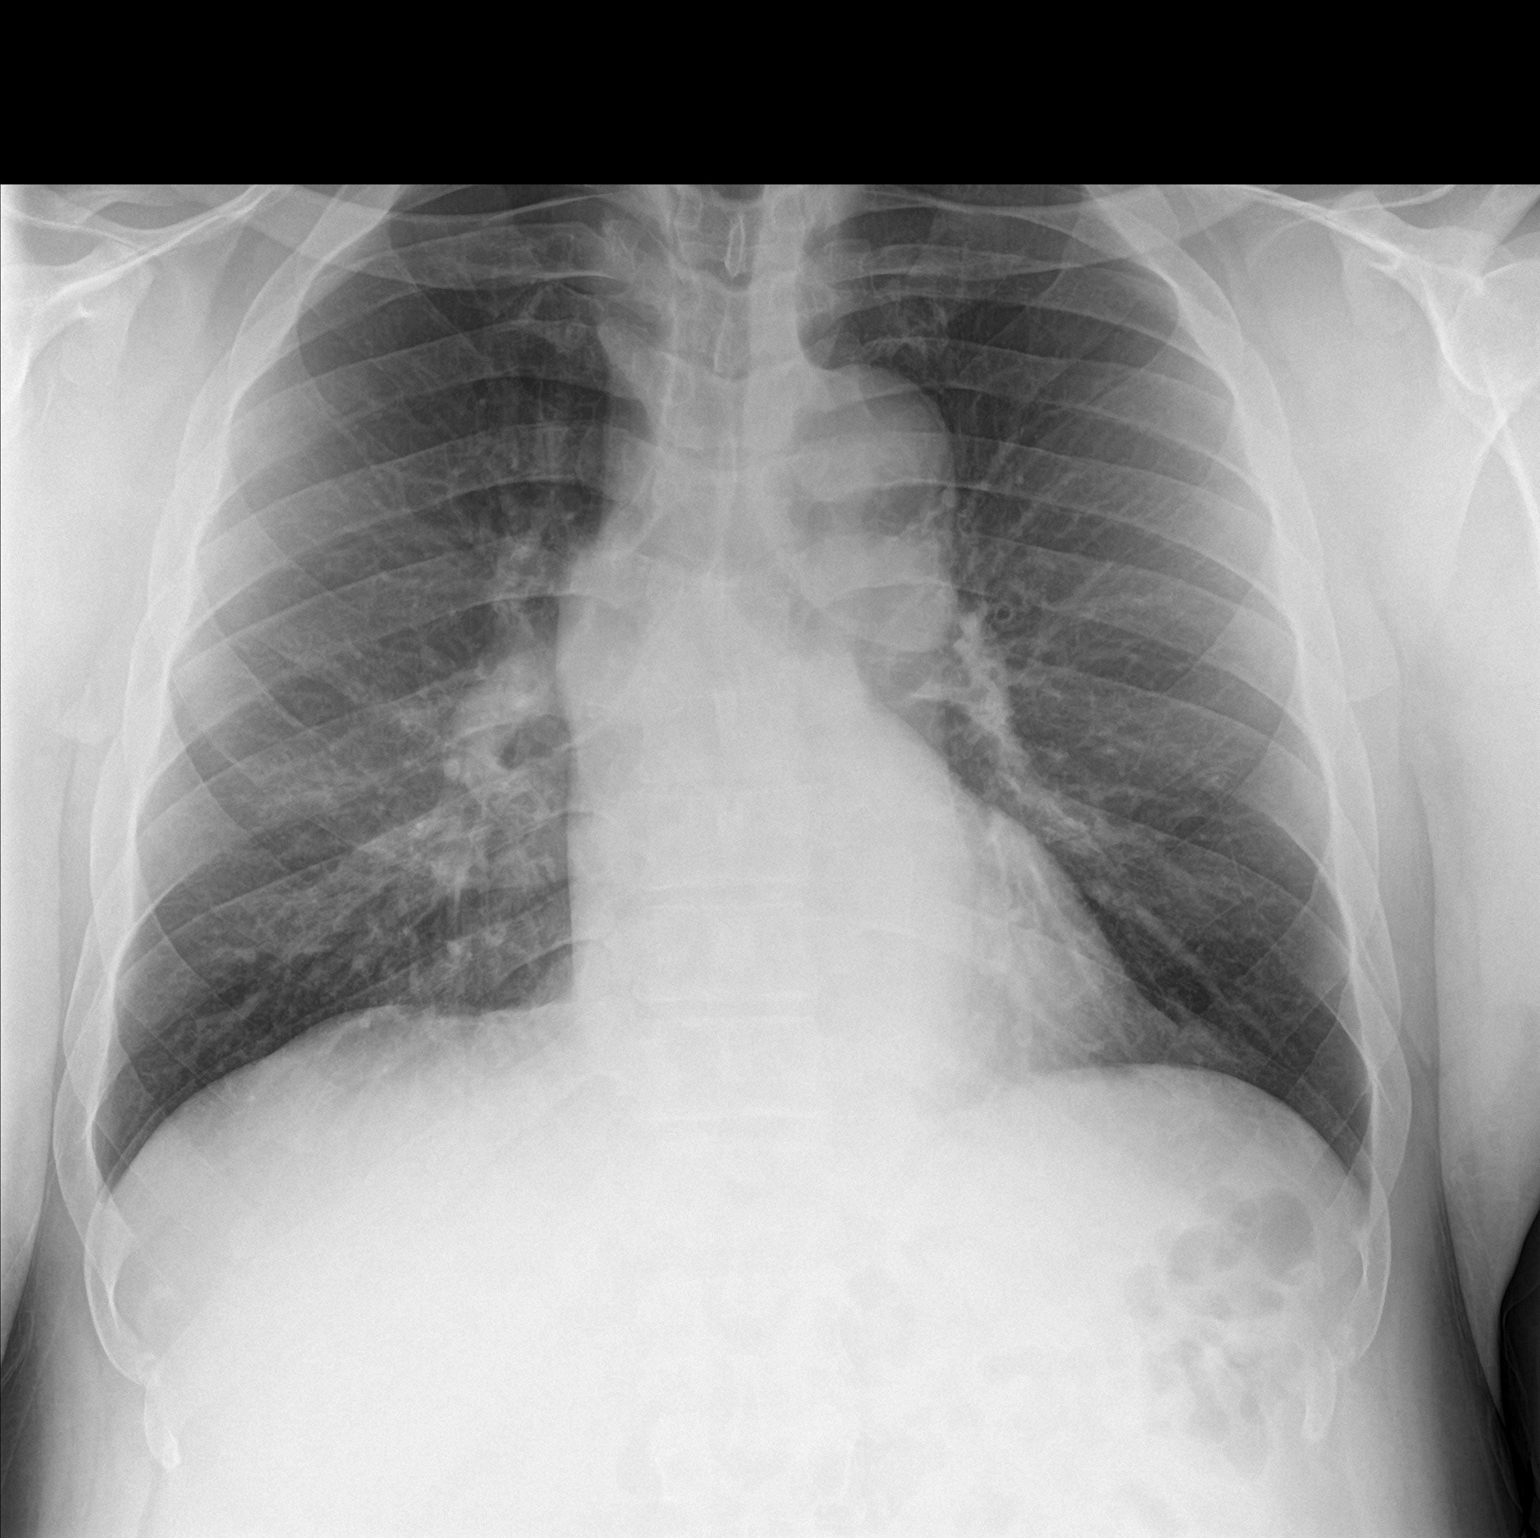

[chest ap]
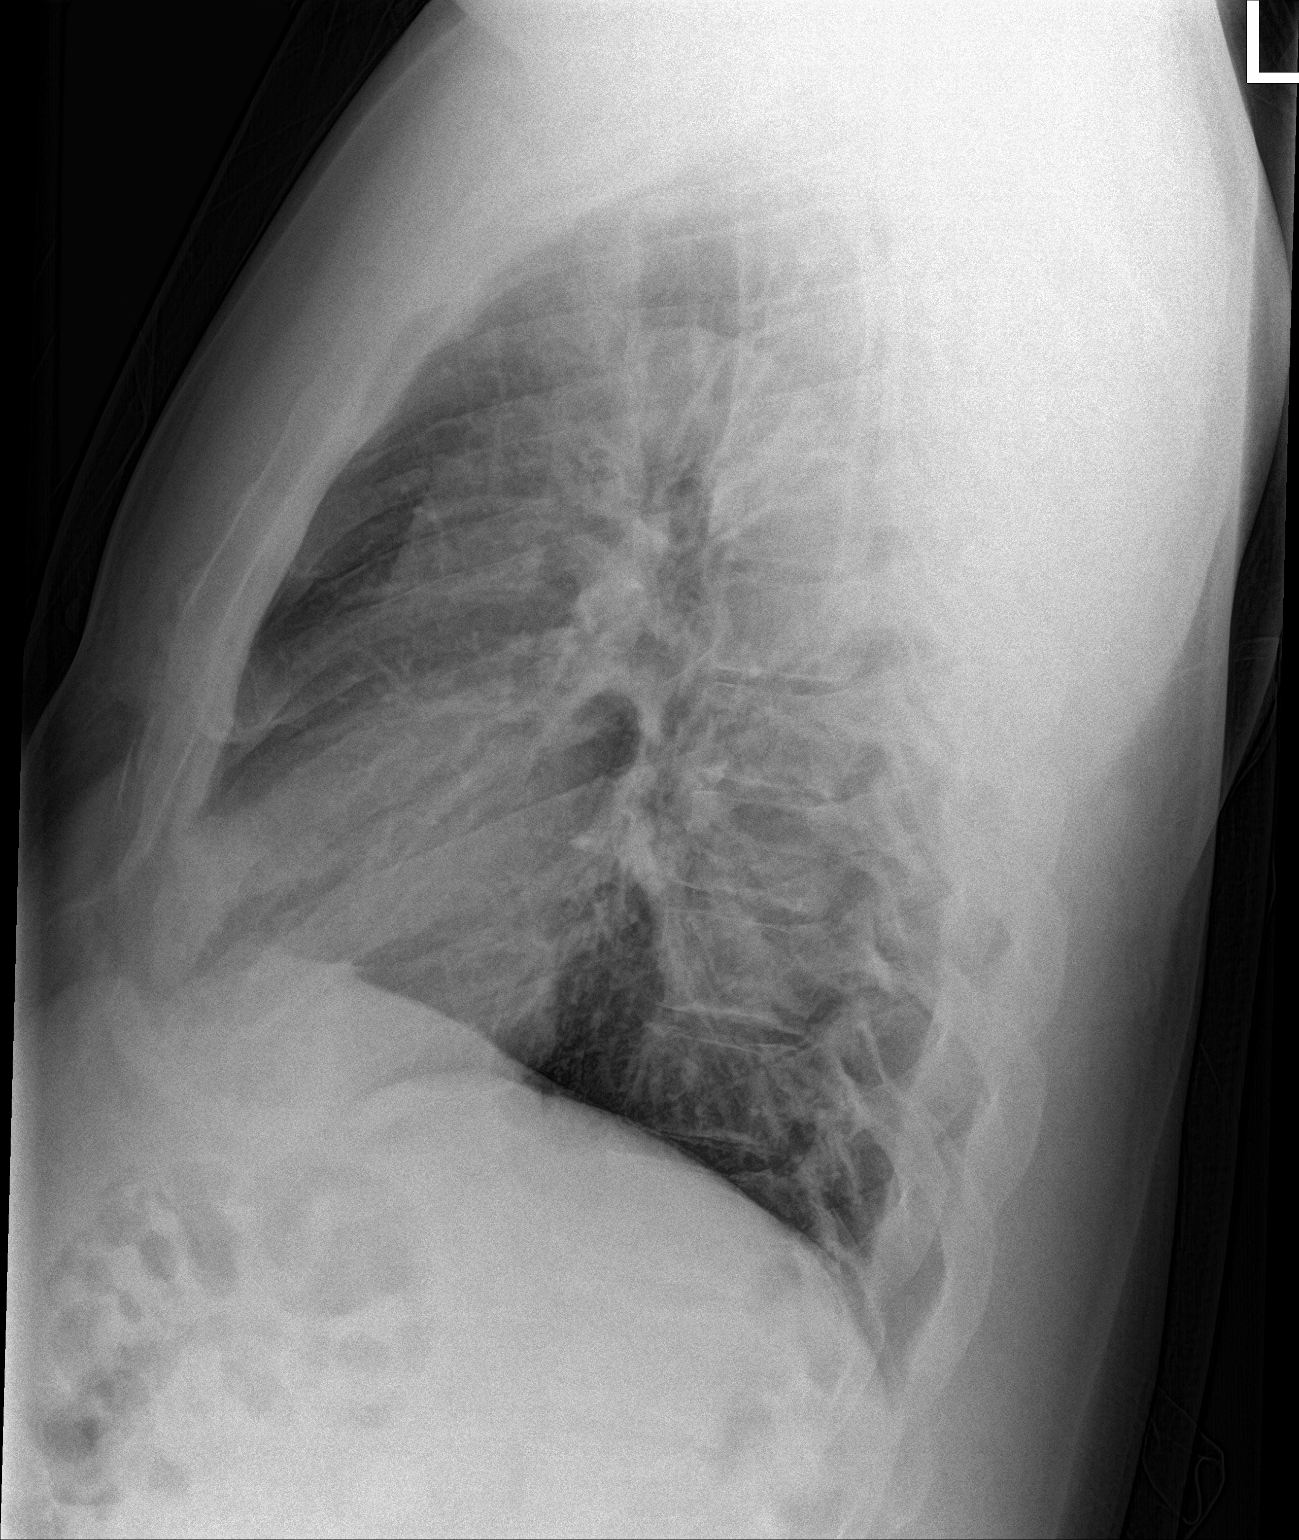

[2 of 2 positions shown; findings below may reference images not displayed]

FINDINGS: The heart size and mediastinal contours are within normal limits.
Both lungs are clear. The visualized skeletal structures are
unremarkable.
IMPRESSION: No active cardiopulmonary disease.

## 2017-03-29 MED ORDER — IPRATROPIUM-ALBUTEROL 0.5-2.5 (3) MG/3ML IN SOLN
3.0000 mL | Freq: Once | RESPIRATORY_TRACT | Status: AC
  Administered 2017-03-29: 3 mL via RESPIRATORY_TRACT

## 2017-03-29 MED ORDER — IBUPROFEN 800 MG PO TABS
800.0000 mg | ORAL_TABLET | Freq: Once | ORAL | Status: AC
  Administered 2017-03-29: 800 mg via ORAL
  Filled 2017-03-29: qty 1

## 2017-03-29 MED ORDER — IPRATROPIUM-ALBUTEROL 0.5-2.5 (3) MG/3ML IN SOLN
RESPIRATORY_TRACT | Status: AC
  Administered 2017-03-29: 3 mL via RESPIRATORY_TRACT
  Filled 2017-03-29: qty 3

## 2017-03-29 MED ORDER — ALBUTEROL SULFATE HFA 108 (90 BASE) MCG/ACT IN AERS: 2.0000 | INHALATION_SPRAY | Freq: Four times a day (QID) | RESPIRATORY_TRACT | 0 refills | Status: DC | PRN

## 2017-03-29 MED ORDER — BUDESONIDE-FORMOTEROL FUMARATE 160-4.5 MCG/ACT IN AERO: 2.0000 | INHALATION_SPRAY | Freq: Every day | RESPIRATORY_TRACT | 12 refills | Status: DC

## 2017-03-29 MED ORDER — ALBUTEROL SULFATE (2.5 MG/3ML) 0.083% IN NEBU
2.5000 mg | INHALATION_SOLUTION | Freq: Once | RESPIRATORY_TRACT | Status: AC
  Administered 2017-03-29 (×2): 2.5 mg via RESPIRATORY_TRACT

## 2017-03-29 MED ORDER — ALBUTEROL SULFATE (2.5 MG/3ML) 0.083% IN NEBU
INHALATION_SOLUTION | RESPIRATORY_TRACT | Status: AC
  Administered 2017-03-29: 2.5 mg via RESPIRATORY_TRACT
  Filled 2017-03-29: qty 3

## 2017-03-29 MED ORDER — LISINOPRIL 10 MG PO TABS
20.0000 mg | ORAL_TABLET | Freq: Once | ORAL | Status: AC
Start: 2017-03-29 — End: 2017-03-29
  Administered 2017-03-29: 20 mg via ORAL
  Filled 2017-03-29: qty 2

## 2017-03-29 MED ORDER — IPRATROPIUM-ALBUTEROL 0.5-2.5 (3) MG/3ML IN SOLN
RESPIRATORY_TRACT | Status: AC
  Administered 2017-03-29: 3 mL
  Filled 2017-03-29: qty 3

## 2017-03-29 MED ORDER — PREDNISONE 50 MG PO TABS: 50.0000 mg | ORAL_TABLET | Freq: Every day | ORAL | 0 refills | Status: DC

## 2017-03-29 MED FILL — SYMBICORT 160-4.5 MCG INH: 160-4.5 | 34 days supply | Qty: 10 | Fill #0

## 2017-03-29 MED FILL — predniSONE 50 MG TABS: 50 | 5 days supply | Qty: 5 | Fill #0

## 2017-03-29 NOTE — Discharge Instructions (Signed)
It was my pleasure taking care of you today!   Take prednisone daily starting today.  I have given you a refill of your symbicort and albuterol inhaler.   Follow up with your primary care provider for further discussion of today's ER visit and for management of your elevated blood pressure.   If you develop any new or worsening symptoms, including but not limited to fever, persistent vomiting, worsening shortness of breath or other symptoms that concern you, please return to the Emergency Department immediately.

## 2017-03-29 NOTE — ED Triage Notes (Signed)
Pt reports feeling wheezy and sob x 1 week, pt is cursing and yelling into cell phone at triage. Asked to hang up phone, pt states "I know what I need, just give me my medicine so I can leave." explained to pt that he needs to see md, rt steve assessing pt, wheezing throughout lung fields a+p x 2. Pt informed that he needs aerosol treatment and that he will be here for a while with [place]. Pt also reports low back pain x 1 week.
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
## 2017-06-03 ENCOUNTER — Emergency Department (HOSPITAL_BASED_OUTPATIENT_CLINIC_OR_DEPARTMENT_OTHER)
Admission: EM | Admit: 2017-06-03 | Discharge: 2017-06-03 | Disposition: A | Discharge status: Home / Self Care | Payer: Medicaid Other | Attending: Emergency Medicine | Admitting: Emergency Medicine

## 2017-06-03 ENCOUNTER — Other Ambulatory Visit: Payer: Self-pay

## 2017-06-03 ENCOUNTER — Encounter (HOSPITAL_BASED_OUTPATIENT_CLINIC_OR_DEPARTMENT_OTHER): Payer: Self-pay | Admitting: *Deleted

## 2017-06-03 DIAGNOSIS — T310 Burns involving less than 10% of body surface: Secondary | ICD-10-CM | POA: Diagnosis not present

## 2017-06-03 DIAGNOSIS — Y999 Unspecified external cause status: Secondary | ICD-10-CM | POA: Insufficient documentation

## 2017-06-03 DIAGNOSIS — T22211A Burn of second degree of right forearm, initial encounter: Secondary | ICD-10-CM | POA: Diagnosis not present

## 2017-06-03 DIAGNOSIS — X102XXA Contact with fats and cooking oils, initial encounter: Secondary | ICD-10-CM | POA: Diagnosis not present

## 2017-06-03 DIAGNOSIS — R0602 Shortness of breath: Secondary | ICD-10-CM | POA: Diagnosis present

## 2017-06-03 DIAGNOSIS — J4541 Moderate persistent asthma with (acute) exacerbation: Secondary | ICD-10-CM | POA: Diagnosis not present

## 2017-06-03 DIAGNOSIS — I10 Essential (primary) hypertension: Secondary | ICD-10-CM | POA: Insufficient documentation

## 2017-06-03 DIAGNOSIS — Y929 Unspecified place or not applicable: Secondary | ICD-10-CM | POA: Insufficient documentation

## 2017-06-03 DIAGNOSIS — E119 Type 2 diabetes mellitus without complications: Secondary | ICD-10-CM | POA: Insufficient documentation

## 2017-06-03 DIAGNOSIS — Z79899 Other long term (current) drug therapy: Secondary | ICD-10-CM | POA: Insufficient documentation

## 2017-06-03 DIAGNOSIS — Y93G3 Activity, cooking and baking: Secondary | ICD-10-CM | POA: Diagnosis not present

## 2017-06-03 MED ORDER — ALBUTEROL SULFATE (2.5 MG/3ML) 0.083% IN NEBU
INHALATION_SOLUTION | RESPIRATORY_TRACT | Status: AC
  Administered 2017-06-03: 2.5 mg
  Filled 2017-06-03: qty 3

## 2017-06-03 MED ORDER — PREDNISONE 10 MG PO TABS
60.0000 mg | ORAL_TABLET | Freq: Once | ORAL | Status: AC
  Administered 2017-06-03: 60 mg via ORAL
  Filled 2017-06-03: qty 1

## 2017-06-03 MED ORDER — IPRATROPIUM-ALBUTEROL 0.5-2.5 (3) MG/3ML IN SOLN
RESPIRATORY_TRACT | Status: AC
  Administered 2017-06-03: 3 mL
  Filled 2017-06-03: qty 3

## 2017-06-03 MED ORDER — PREDNISONE 10 MG PO TABS: 40.0000 mg | ORAL_TABLET | Freq: Every day | ORAL | 0 refills | Status: AC

## 2017-06-03 MED ORDER — IPRATROPIUM BROMIDE 0.02 % IN SOLN
0.5000 mg | Freq: Once | RESPIRATORY_TRACT | Status: AC
  Administered 2017-06-03: 0.5 mg via RESPIRATORY_TRACT
  Filled 2017-06-03: qty 2.5

## 2017-06-03 MED ORDER — ALBUTEROL (5 MG/ML) CONTINUOUS INHALATION SOLN
5.0000 mg/h | INHALATION_SOLUTION | RESPIRATORY_TRACT | Status: DC
  Administered 2017-06-03: 5 mg/h via RESPIRATORY_TRACT
  Filled 2017-06-03: qty 20

## 2017-06-03 MED ORDER — SILVER SULFADIAZINE 1 % EX CREA
TOPICAL_CREAM | Freq: Once | CUTANEOUS | Status: AC
  Administered 2017-06-03: 1 via TOPICAL
  Filled 2017-06-03: qty 85

## 2017-06-03 MED FILL — predniSONE 10 MG TABS: 10 | 4 days supply | Qty: 16 | Fill #0

## 2017-06-03 NOTE — ED Notes (Signed)
Patient states he needs to leave by 10:00. MD aware, [facility] treatment orders changed
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
## 2017-09-07 ENCOUNTER — Encounter (HOSPITAL_BASED_OUTPATIENT_CLINIC_OR_DEPARTMENT_OTHER): Payer: Self-pay | Admitting: Emergency Medicine

## 2017-09-07 ENCOUNTER — Other Ambulatory Visit: Payer: Self-pay

## 2017-09-07 ENCOUNTER — Emergency Department (HOSPITAL_BASED_OUTPATIENT_CLINIC_OR_DEPARTMENT_OTHER)
Admission: EM | Admit: 2017-09-07 | Discharge: 2017-09-07 | Disposition: A | Discharge status: Home / Self Care | Payer: Medicaid Other | Attending: Emergency Medicine | Admitting: Emergency Medicine

## 2017-09-07 DIAGNOSIS — I10 Essential (primary) hypertension: Secondary | ICD-10-CM | POA: Insufficient documentation

## 2017-09-07 DIAGNOSIS — J4521 Mild intermittent asthma with (acute) exacerbation: Secondary | ICD-10-CM | POA: Diagnosis not present

## 2017-09-07 DIAGNOSIS — Z79899 Other long term (current) drug therapy: Secondary | ICD-10-CM | POA: Insufficient documentation

## 2017-09-07 DIAGNOSIS — R062 Wheezing: Secondary | ICD-10-CM | POA: Diagnosis present

## 2017-09-07 MED ORDER — IPRATROPIUM BROMIDE 0.02 % IN SOLN
0.5000 mg | Freq: Once | RESPIRATORY_TRACT | Status: AC
  Administered 2017-09-07: 0.5 mg via RESPIRATORY_TRACT
  Filled 2017-09-07: qty 2.5

## 2017-09-07 MED ORDER — PREDNISONE 50 MG PO TABS
60.0000 mg | ORAL_TABLET | Freq: Once | ORAL | Status: AC
Start: 2017-09-07 — End: 2017-09-07
  Administered 2017-09-07: 60 mg via ORAL
  Filled 2017-09-07: qty 1

## 2017-09-07 MED ORDER — ALBUTEROL SULFATE (2.5 MG/3ML) 0.083% IN NEBU
2.5000 mg | INHALATION_SOLUTION | Freq: Once | RESPIRATORY_TRACT | Status: AC
  Administered 2017-09-07: 2.5 mg via RESPIRATORY_TRACT
  Filled 2017-09-07: qty 3

## 2017-09-07 MED ORDER — PREDNISONE 20 MG PO TABS: 60.0000 mg | ORAL_TABLET | Freq: Every day | ORAL | 0 refills | Status: DC

## 2017-09-07 MED ORDER — ALBUTEROL SULFATE (2.5 MG/3ML) 0.083% IN NEBU
5.0000 mg | INHALATION_SOLUTION | Freq: Once | RESPIRATORY_TRACT | Status: AC
  Administered 2017-09-07: 5 mg via RESPIRATORY_TRACT
  Filled 2017-09-07: qty 6

## 2017-09-07 MED ORDER — IPRATROPIUM-ALBUTEROL 0.5-2.5 (3) MG/3ML IN SOLN
3.0000 mL | Freq: Once | RESPIRATORY_TRACT | Status: AC
  Administered 2017-09-07: 3 mL via RESPIRATORY_TRACT
  Filled 2017-09-07: qty 3

## 2017-09-07 NOTE — ED Provider Notes (Signed)
I assumed care of this patient from Dr. Elesa MassedWard at 0700.  Please see their note for further details of Hx, PE.  Briefly patient is a 52 y.o. male who presented with shortness of breath consistent with asthma exacerbation.  Patient has had 2 rounds of duo nebs and steroids.  Still having some mild wheeze..   Current plan is to provide the patient with additional breathing treatment and reassess.  7:53 AM On reassessment wheezing has significantly improved.  Patient reports feeling much better and ready to be discharged.  The patient is safe for discharge with strict return precautions.  Disposition: Discharge  Condition: Good  I have discussed the results, Dx and Tx plan with the patient who expressed understanding and agree(s) with the plan. Discharge instructions discussed at great length. The patient was given strict return precautions who verbalized understanding of the instructions. No further questions at time of discharge.    ED Discharge Orders        Ordered    predniSONE (DELTASONE) 20 MG tablet  Daily     09/07/17 [id number]       Follow Up: Jackie Plumsei-Bonsu, George, MD [address] [place] [address] [phone]   As needed        Nira ConnCardama, Pedro Eduardo, MD 09/07/17 [phone]

## 2017-09-07 NOTE — ED Provider Notes (Signed)
TIME SEEN: 6:30 AM  CHIEF COMPLAINT: Wheezing, cough  HPI: Patient is a 52 year old male with history of borderline diabetes, hypertension, hyperlipidemia, asthma who presents to the emergency department with a couple of days of wheezing and cough with gray sputum production.  No fever.  States he has albuterol inhaler and a nebulizer machine at home.  States he has been using his medications but when wheezing does not respond he knows to come to the ER.  States he feels he needs prednisone.  Did have an influenza vaccination this year.  No vomiting or diarrhea.  States he has an inhaler at home and plenty of medicine for his nebulizer machine.  ROS: See HPI Constitutional: no fever  Eyes: no drainage  ENT: no runny nose   Cardiovascular:  no chest pain  Resp: Wheezing, cough, SOB  GI: no vomiting GU: no dysuria Integumentary: no rash  Allergy: no hives  Musculoskeletal: no leg swelling  Neurological: no slurred speech ROS otherwise negative  PAST MEDICAL HISTORY/PAST SURGICAL HISTORY:  Past Medical History:  Diagnosis Date  . Asthma   . Depression   . Diabetes mellitus without complication (HCC)    pt states borderline  . Gout   . High cholesterol   . History of stomach ulcers   . Hypertension     MEDICATIONS:  Prior to Admission medications   Medication Sig Start Date End Date Taking? Authorizing Provider  albuterol (PROVENTIL HFA;VENTOLIN HFA) 108 (90 Base) MCG/ACT inhaler Inhale 1-2 puffs into the lungs every 4 (four) hours as needed for wheezing or shortness of breath. 02/18/16   Pricilla Loveless, MD  albuterol (PROVENTIL HFA;VENTOLIN HFA) 108 (90 Base) MCG/ACT inhaler Inhale 2 puffs into the lungs every 4 (four) hours as needed for wheezing or shortness of breath. 01/30/17   Cathren Laine, MD  albuterol (PROVENTIL HFA;VENTOLIN HFA) 108 (90 Base) MCG/ACT inhaler Inhale 2 puffs into the lungs every 6 (six) hours as needed for wheezing or shortness of breath. 03/29/17    Kellie Shropshire, PA-C  albuterol (PROVENTIL) (2.5 MG/3ML) 0.083% nebulizer solution Take 2.5 mg by nebulization every 2 (two) hours as needed. For wheezing      [provider]  amoxicillin-clavulanate (AUGMENTIN) 875-125 MG tablet Take 1 tablet by mouth every 12 (twelve) hours. 07/16/16   Annie Saephan, Baxter Hire N, DO  ATORVASTATIN CALCIUM PO Take by mouth.    [provider]  budesonide-formoterol (SYMBICORT) 160-4.5 MCG/ACT inhaler Inhale 2 puffs into the lungs daily. 10/24/16   Hedges, Tinnie Gens, PA-C  budesonide-formoterol (SYMBICORT) 160-4.5 MCG/ACT inhaler Inhale 2 puffs into the lungs daily. 01/30/17   Cathren Laine, MD  budesonide-formoterol (SYMBICORT) 160-4.5 MCG/ACT inhaler Inhale 2 puffs into the lungs daily. 03/29/17   Kellie Shropshire, PA-C  cephALEXin (KEFLEX) 500 MG capsule Take 1 capsule (500 mg total) by mouth 3 (three) times daily. 09/08/16   Daijanae Rafalski, Chase Picket, PA-C  indomethacin (INDOCIN) 25 MG capsule Take 1 capsule (25 mg total) by mouth 3 (three) times daily as needed. 04/22/15   Vanetta Mulders, MD  lisinopril (PRINIVIL,ZESTRIL) 20 MG tablet Take 20 mg by mouth daily.      [provider]  meloxicam (MOBIC) 15 MG tablet Take 15 mg by mouth daily as needed for pain.    [provider]  omeprazole (PRILOSEC) 20 MG capsule Take 1 capsule (20 mg total) by mouth daily. 05/07/16   Arby Barrette, MD  OMEPRAZOLE PO Take by mouth as needed.     [provider]  oxyCODONE-acetaminophen (PERCOCET/ROXICET) 5-325 MG tablet Take by mouth every 4 (four) hours as needed for severe pain.    [provider]  albuterol (PROVENTIL,VENTOLIN) 90 MCG/ACT inhaler Inhale 4 puffs into the lungs 3 (three) times daily as needed. For shortness of breath   03/05/15  [provider]  metoCLOPramide (REGLAN) 10 MG tablet Take 1 tablet (10 mg total) by mouth every 6 (six) hours as needed for nausea. 02/04/15 03/05/15  Charlynne PanderYao, David Hsienta, MD    ALLERGIES:   Allergies  Allergen Reactions  . Shellfish Allergy Anaphylaxis    SOCIAL HISTORY:  Social History   Tobacco Use  . Smoking status: Never Smoker  . Smokeless tobacco: Never Used  Substance Use Topics  . Alcohol use: Yes    Alcohol/week: 0.6 oz    Types: 1 Cans of beer per week    Comment: occ    FAMILY HISTORY: No family history on file.  EXAM: BP (!) 175/100 (BP Location: Right Arm)   Pulse (!) 112   Temp 98 F (36.7 C) (Oral)   Resp (!) 25   SpO2 94%  CONSTITUTIONAL: Alert and oriented and responds appropriately to questions. Well-appearing; well-nourished HEAD: Normocephalic EYES: Conjunctivae clear, pupils appear equal, EOMI ENT: normal nose; moist mucous membranes NECK: Supple, no meningismus, no nuchal rigidity, no LAD  CARD: Regular and tachycardic; S1 and S2 appreciated; no murmurs, no clicks, no rubs, no gallops RESP: Normal chest excursion without splinting or tachypnea; breath sounds equal bilaterally but he does have scattered expiratory and inspiratory wheezes on examination, no rhonchi, no rales, no hypoxia or respiratory distress, speaking full sentences ABD/GI: Normal bowel sounds; non-distended; soft, non-tender, no rebound, no guarding, no peritoneal signs, no hepatosplenomegaly BACK:  The back appears normal and is non-tender to palpation, there is no CVA tenderness EXT: Normal ROM in all joints; non-tender to palpation; no edema; normal capillary refill; no cyanosis, no calf tenderness or swelling    SKIN: Normal color for age and race; warm; no rash NEURO: Moves all extremities equally PSYCH: The patient's mood and manner are appropriate. Grooming and personal hygiene are appropriate.  MEDICAL DECISION MAKING: Patient here with asthma appreciated.  No fever.  Doubt pneumothorax.  No hypoxia or increased work of breathing or respiratory distress.  No sign of volume overload.  Doubt pneumonia.  Will give albuterol, prednisone.  Patient comfortable with  this plan.  Doubt PE, dissection, ACS.  ED PROGRESS: Patient reports feeling much better.  Still has some scattered expiratory wheezing.  Will give 1 more breathing treatment.  Anticipate that he will likely be able to be discharged home after this.  We discussed return precautions.  Will discharge with prednisone burst.  At this time, I do not feel there is any life-threatening condition present. I have reviewed and discussed all results (EKG, imaging, lab, urine as appropriate) and exam findings with patient/family. I have reviewed nursing notes and appropriate previous records.  I feel the patient is safe to be discharged home without further emergent workup and can continue workup as an outpatient as needed. Discussed usual and customary return precautions. Patient/family verbalize understanding and are comfortable with this plan.  Outpatient follow-up has been provided if needed. All questions have been answered.      Trudy Kory, Layla MawKristen N, DO 09/07/17 [phone]

## 2017-09-07 NOTE — ED Triage Notes (Signed)
Pt presents with c/o shortness of breath for a week but worse today

## 2017-10-05 ENCOUNTER — Other Ambulatory Visit: Payer: Self-pay

## 2017-10-05 ENCOUNTER — Encounter (HOSPITAL_BASED_OUTPATIENT_CLINIC_OR_DEPARTMENT_OTHER): Payer: Self-pay | Admitting: Emergency Medicine

## 2017-10-05 ENCOUNTER — Emergency Department (HOSPITAL_BASED_OUTPATIENT_CLINIC_OR_DEPARTMENT_OTHER)
Admission: EM | Admit: 2017-10-05 | Discharge: 2017-10-05 | Disposition: A | Discharge status: Home / Self Care | Payer: Medicaid Other | Attending: Emergency Medicine | Admitting: Emergency Medicine

## 2017-10-05 DIAGNOSIS — H5789 Other specified disorders of eye and adnexa: Secondary | ICD-10-CM | POA: Diagnosis not present

## 2017-10-05 DIAGNOSIS — Z5321 Procedure and treatment not carried out due to patient leaving prior to being seen by health care provider: Secondary | ICD-10-CM | POA: Diagnosis not present

## 2017-10-05 NOTE — ED Triage Notes (Signed)
OU irritation  since yesterday, states has been around a person with pinkeye

## 2017-10-05 NOTE — ED Notes (Signed)
Called x 1 in waiting room, no answer

## 2017-10-20 ENCOUNTER — Other Ambulatory Visit: Payer: Self-pay

## 2017-10-20 ENCOUNTER — Encounter (HOSPITAL_BASED_OUTPATIENT_CLINIC_OR_DEPARTMENT_OTHER): Payer: Self-pay | Admitting: Emergency Medicine

## 2017-10-20 ENCOUNTER — Emergency Department (HOSPITAL_BASED_OUTPATIENT_CLINIC_OR_DEPARTMENT_OTHER)
Admission: EM | Admit: 2017-10-20 | Discharge: 2017-10-20 | Disposition: A | Discharge status: Home / Self Care | Payer: Medicaid Other | Attending: Emergency Medicine | Admitting: Emergency Medicine

## 2017-10-20 DIAGNOSIS — J4521 Mild intermittent asthma with (acute) exacerbation: Secondary | ICD-10-CM

## 2017-10-20 DIAGNOSIS — Z79899 Other long term (current) drug therapy: Secondary | ICD-10-CM | POA: Diagnosis not present

## 2017-10-20 DIAGNOSIS — I10 Essential (primary) hypertension: Secondary | ICD-10-CM | POA: Diagnosis not present

## 2017-10-20 DIAGNOSIS — R05 Cough: Secondary | ICD-10-CM | POA: Diagnosis present

## 2017-10-20 DIAGNOSIS — E119 Type 2 diabetes mellitus without complications: Secondary | ICD-10-CM | POA: Diagnosis not present

## 2017-10-20 LAB — CBG MONITORING, ED: Glucose-Capillary: 167 mg/dL — ABNORMAL HIGH (ref 65–99)

## 2017-10-20 MED ORDER — PREDNISONE 20 MG PO TABS
40.0000 mg | ORAL_TABLET | Freq: Once | ORAL | Status: AC
  Administered 2017-10-20: 40 mg via ORAL
  Filled 2017-10-20: qty 2

## 2017-10-20 MED ORDER — ALBUTEROL SULFATE (2.5 MG/3ML) 0.083% IN NEBU
5.0000 mg | INHALATION_SOLUTION | Freq: Once | RESPIRATORY_TRACT | Status: AC
Start: 2017-10-20 — End: 2017-10-20
  Administered 2017-10-20: 5 mg via RESPIRATORY_TRACT
  Filled 2017-10-20: qty 6

## 2017-10-20 MED ORDER — PREDNISONE 50 MG PO TABS: 60.0000 mg | ORAL_TABLET | Freq: Once | ORAL | Status: DC

## 2017-10-20 MED ORDER — PREDNISONE 20 MG PO TABS: ORAL_TABLET | ORAL | 0 refills | Status: DC

## 2017-10-20 NOTE — ED Triage Notes (Signed)
Cough x 4 days with mild SOB. Hx of asthma, using inhalers without relief.

## 2017-10-20 NOTE — Discharge Instructions (Addendum)
Use your albuterol every 4 hours as needed for shortness of breath.  Return if needed more than every 4 hours.  Keep your scheduled appointment with your doctor tomorrow.  Ask your doctor to recheck your blood pressure .  Today's was elevated at 160/102.  Your blood sugar was slightly high today at 167.  Prednisone may temporarily elevate your blood sugar
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
## 2017-11-21 ENCOUNTER — Emergency Department (HOSPITAL_BASED_OUTPATIENT_CLINIC_OR_DEPARTMENT_OTHER)
Admission: EM | Admit: 2017-11-21 | Discharge: 2017-11-21 | Disposition: A | Discharge status: Home / Self Care | Payer: Medicaid Other | Attending: Emergency Medicine | Admitting: Emergency Medicine

## 2017-11-21 ENCOUNTER — Other Ambulatory Visit: Payer: Self-pay

## 2017-11-21 ENCOUNTER — Encounter (HOSPITAL_BASED_OUTPATIENT_CLINIC_OR_DEPARTMENT_OTHER): Payer: Self-pay | Admitting: Emergency Medicine

## 2017-11-21 DIAGNOSIS — J45901 Unspecified asthma with (acute) exacerbation: Secondary | ICD-10-CM | POA: Diagnosis not present

## 2017-11-21 DIAGNOSIS — Z79899 Other long term (current) drug therapy: Secondary | ICD-10-CM | POA: Diagnosis not present

## 2017-11-21 DIAGNOSIS — E119 Type 2 diabetes mellitus without complications: Secondary | ICD-10-CM | POA: Diagnosis not present

## 2017-11-21 DIAGNOSIS — I10 Essential (primary) hypertension: Secondary | ICD-10-CM | POA: Insufficient documentation

## 2017-11-21 DIAGNOSIS — R062 Wheezing: Secondary | ICD-10-CM | POA: Diagnosis present

## 2017-11-21 MED ORDER — ALBUTEROL SULFATE HFA 108 (90 BASE) MCG/ACT IN AERS
2.0000 | INHALATION_SPRAY | Freq: Once | RESPIRATORY_TRACT | Status: AC
  Administered 2017-11-21: 2 via RESPIRATORY_TRACT
  Filled 2017-11-21: qty 6.7

## 2017-11-21 MED ORDER — PREDNISONE 50 MG PO TABS: 50.0000 mg | ORAL_TABLET | Freq: Every day | ORAL | 0 refills | Status: AC

## 2017-11-21 MED ORDER — IPRATROPIUM-ALBUTEROL 0.5-2.5 (3) MG/3ML IN SOLN
RESPIRATORY_TRACT | Status: AC
  Administered 2017-11-21: 3 mL via RESPIRATORY_TRACT
  Filled 2017-11-21: qty 9

## 2017-11-21 MED ORDER — PREDNISONE 10 MG PO TABS
60.0000 mg | ORAL_TABLET | Freq: Once | ORAL | Status: AC
  Administered 2017-11-21: 60 mg via ORAL
  Filled 2017-11-21: qty 1

## 2017-11-21 MED ORDER — IPRATROPIUM-ALBUTEROL 0.5-2.5 (3) MG/3ML IN SOLN
3.0000 mL | RESPIRATORY_TRACT | Status: AC
  Administered 2017-11-21 (×3): 3 mL via RESPIRATORY_TRACT

## 2017-11-21 MED FILL — predniSONE 50 MG TABS: 50 | 4 days supply | Qty: 4 | Fill #0

## 2017-11-21 NOTE — ED Triage Notes (Signed)
SOB x 2 weeks , out of asthma inhaler

## 2017-11-21 NOTE — Discharge Instructions (Signed)
Your story and exam today are consistent with asthma attack.  As you have improved with steroids and breathing treatment, we feel you are safe for discharge home.  We do not have any suspicion for pneumonia based on her lack of other symptoms.  Please follow-up with your primary care physician for reassessment and further management.  If any symptoms change or worsen, please return to the nearest emergency department.
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
## 2017-11-21 NOTE — ED Notes (Signed)
ED Provider at bedside. 

## 2017-12-28 ENCOUNTER — Other Ambulatory Visit: Payer: Self-pay

## 2017-12-28 ENCOUNTER — Emergency Department (HOSPITAL_BASED_OUTPATIENT_CLINIC_OR_DEPARTMENT_OTHER)
Admission: EM | Admit: 2017-12-28 | Discharge: 2017-12-28 | Disposition: A | Discharge status: Home / Self Care | Payer: Medicaid Other | Attending: Emergency Medicine | Admitting: Emergency Medicine

## 2017-12-28 ENCOUNTER — Emergency Department (HOSPITAL_BASED_OUTPATIENT_CLINIC_OR_DEPARTMENT_OTHER): Payer: Medicaid Other

## 2017-12-28 ENCOUNTER — Encounter (HOSPITAL_BASED_OUTPATIENT_CLINIC_OR_DEPARTMENT_OTHER): Payer: Self-pay | Admitting: Emergency Medicine

## 2017-12-28 DIAGNOSIS — Z79899 Other long term (current) drug therapy: Secondary | ICD-10-CM | POA: Diagnosis not present

## 2017-12-28 DIAGNOSIS — J4541 Moderate persistent asthma with (acute) exacerbation: Secondary | ICD-10-CM | POA: Diagnosis not present

## 2017-12-28 DIAGNOSIS — R0602 Shortness of breath: Secondary | ICD-10-CM | POA: Diagnosis present

## 2017-12-28 DIAGNOSIS — I10 Essential (primary) hypertension: Secondary | ICD-10-CM | POA: Insufficient documentation

## 2017-12-28 IMAGING — DX DG CHEST 2V
2 series · 2 of 2 positions shown · non-contrast
Comparison: [DATE]

CLINICAL DATA: Productive cough and fever for 1 week.

EXAM:
CHEST - 2 VIEW

[chest pa]
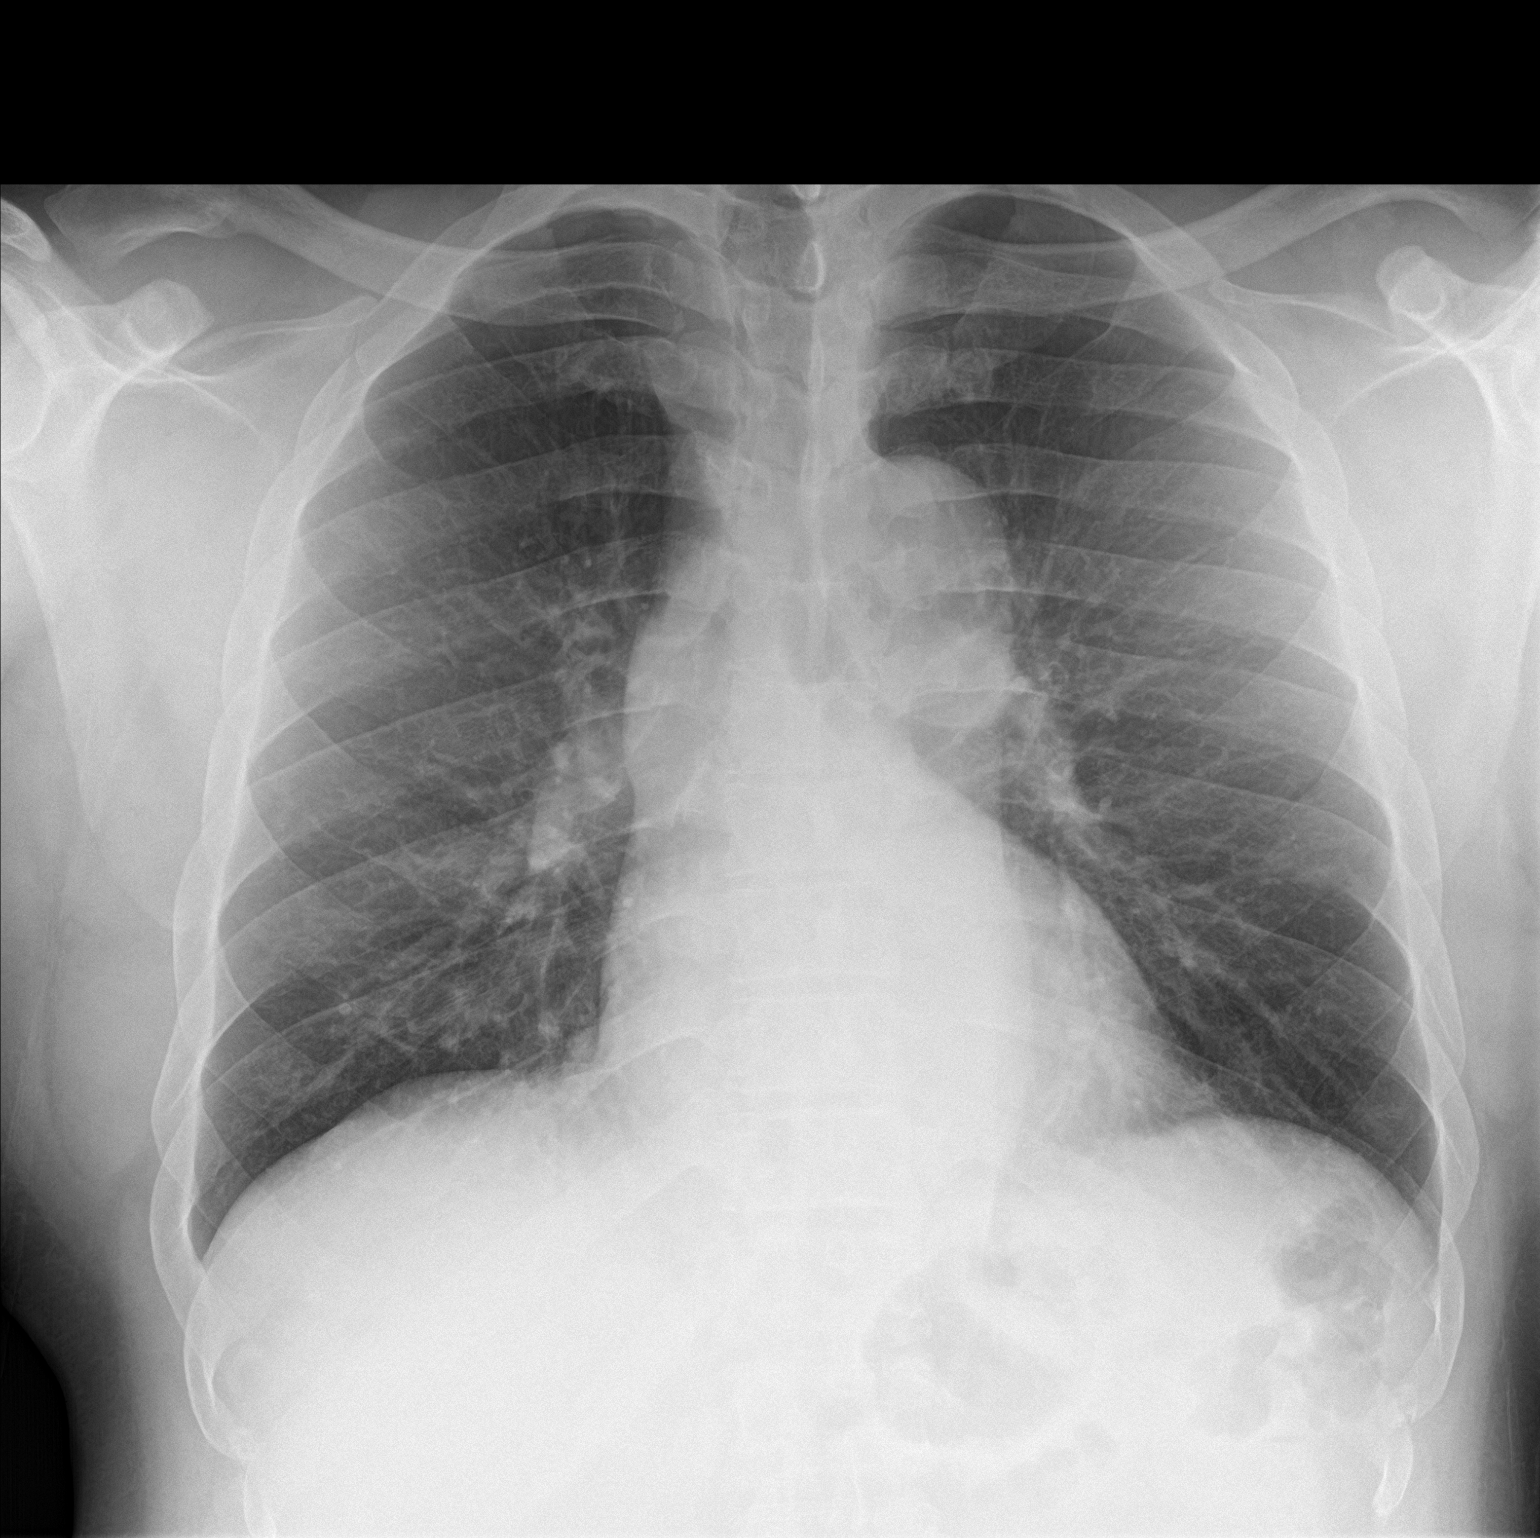

[chest lat]
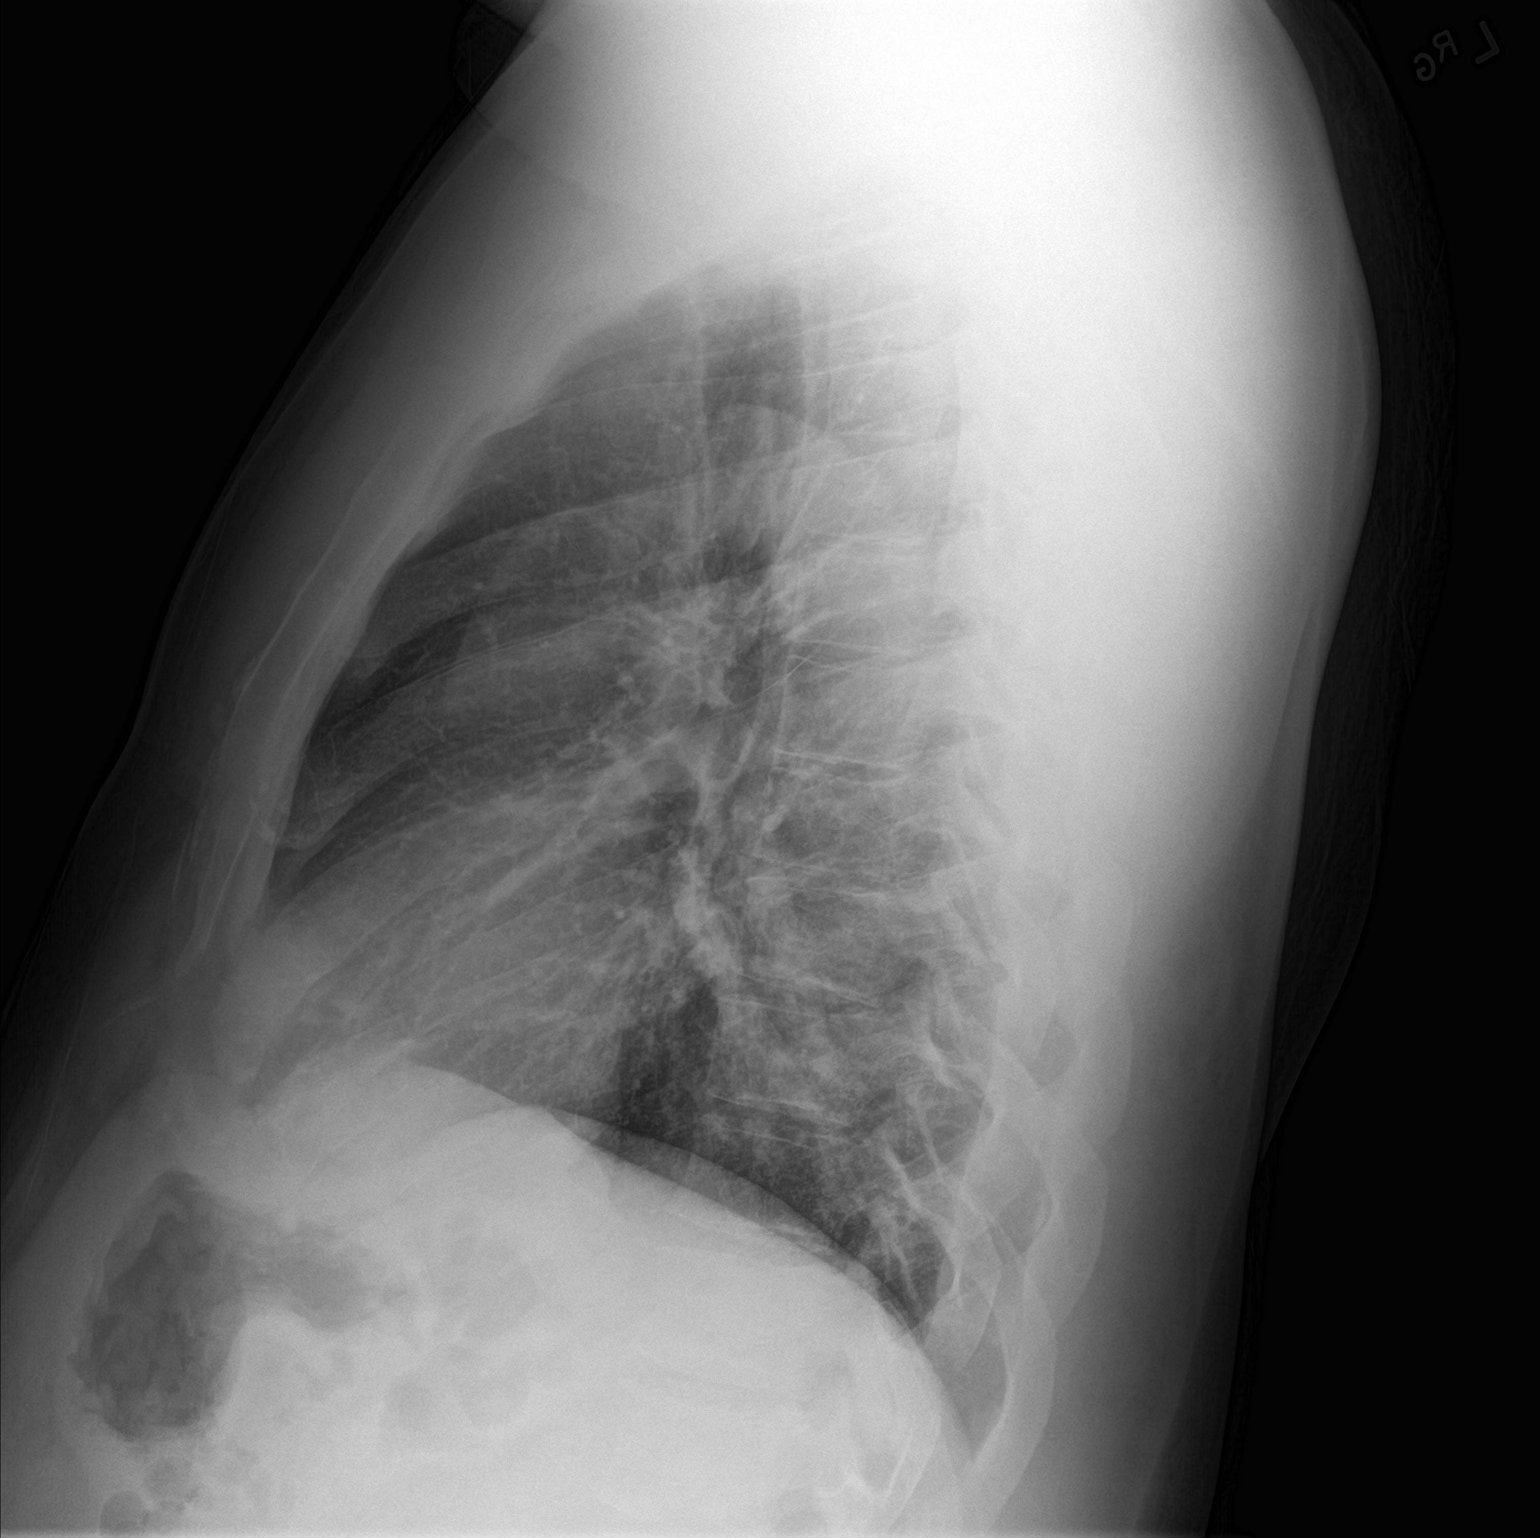

[2 of 2 positions shown; findings below may reference images not displayed]

FINDINGS: Cardiac silhouette is normal in size. No mediastinal or hilar
masses. No evidence of adenopathy.

Clear lungs.  No pleural effusion or pneumothorax.

Skeletal structures are intact.
IMPRESSION: No active cardiopulmonary disease.

## 2017-12-28 MED ORDER — PREDNISONE 20 MG PO TABS: ORAL_TABLET | ORAL | 0 refills | Status: DC

## 2017-12-28 MED ORDER — IPRATROPIUM-ALBUTEROL 0.5-2.5 (3) MG/3ML IN SOLN
3.0000 mL | RESPIRATORY_TRACT | Status: AC
  Administered 2017-12-28 (×3): 3 mL via RESPIRATORY_TRACT

## 2017-12-28 MED ORDER — PREDNISONE 50 MG PO TABS
60.0000 mg | ORAL_TABLET | Freq: Once | ORAL | Status: AC
  Administered 2017-12-28: 60 mg via ORAL
  Filled 2017-12-28: qty 1

## 2017-12-28 MED ORDER — IPRATROPIUM-ALBUTEROL 0.5-2.5 (3) MG/3ML IN SOLN
RESPIRATORY_TRACT | Status: AC
  Administered 2017-12-28: 3 mL via RESPIRATORY_TRACT
  Filled 2017-12-28: qty 9
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
## 2017-12-28 NOTE — ED Triage Notes (Signed)
Patient states that he has been SOB x 1 week. Patient states that he thinks he needs more medications - talking on the phone without distress noted

## 2018-01-10 ENCOUNTER — Emergency Department (HOSPITAL_BASED_OUTPATIENT_CLINIC_OR_DEPARTMENT_OTHER): Payer: Medicaid Other

## 2018-01-10 ENCOUNTER — Emergency Department (HOSPITAL_BASED_OUTPATIENT_CLINIC_OR_DEPARTMENT_OTHER)
Admission: EM | Admit: 2018-01-10 | Discharge: 2018-01-10 | Disposition: A | Discharge status: Home / Self Care | Payer: Medicaid Other | Attending: Emergency Medicine | Admitting: Emergency Medicine

## 2018-01-10 ENCOUNTER — Other Ambulatory Visit: Payer: Self-pay

## 2018-01-10 ENCOUNTER — Encounter (HOSPITAL_BASED_OUTPATIENT_CLINIC_OR_DEPARTMENT_OTHER): Payer: Self-pay | Admitting: Emergency Medicine

## 2018-01-10 DIAGNOSIS — Y998 Other external cause status: Secondary | ICD-10-CM | POA: Diagnosis not present

## 2018-01-10 DIAGNOSIS — W010XXA Fall on same level from slipping, tripping and stumbling without subsequent striking against object, initial encounter: Secondary | ICD-10-CM | POA: Diagnosis not present

## 2018-01-10 DIAGNOSIS — Z79899 Other long term (current) drug therapy: Secondary | ICD-10-CM | POA: Insufficient documentation

## 2018-01-10 DIAGNOSIS — I10 Essential (primary) hypertension: Secondary | ICD-10-CM | POA: Insufficient documentation

## 2018-01-10 DIAGNOSIS — S62114A Nondisplaced fracture of triquetrum [cuneiform] bone, right wrist, initial encounter for closed fracture: Secondary | ICD-10-CM

## 2018-01-10 DIAGNOSIS — S92244A Nondisplaced fracture of medial cuneiform of right foot, initial encounter for closed fracture: Secondary | ICD-10-CM | POA: Insufficient documentation

## 2018-01-10 DIAGNOSIS — Y92410 Unspecified street and highway as the place of occurrence of the external cause: Secondary | ICD-10-CM | POA: Insufficient documentation

## 2018-01-10 DIAGNOSIS — J45909 Unspecified asthma, uncomplicated: Secondary | ICD-10-CM | POA: Insufficient documentation

## 2018-01-10 DIAGNOSIS — E7800 Pure hypercholesterolemia, unspecified: Secondary | ICD-10-CM | POA: Insufficient documentation

## 2018-01-10 DIAGNOSIS — Y9301 Activity, walking, marching and hiking: Secondary | ICD-10-CM | POA: Insufficient documentation

## 2018-01-10 DIAGNOSIS — S6991XA Unspecified injury of right wrist, hand and finger(s), initial encounter: Secondary | ICD-10-CM | POA: Diagnosis present

## 2018-01-10 DIAGNOSIS — E119 Type 2 diabetes mellitus without complications: Secondary | ICD-10-CM | POA: Insufficient documentation

## 2018-01-10 IMAGING — CR DG WRIST COMPLETE 3+V*R*
4 series · 4 of 4 positions shown · non-contrast
Comparison: Right hand series today and [DATE].

CLINICAL DATA: 51-year-old male status post fall last night with
hand and wrist pain.

EXAM:
RIGHT WRIST - COMPLETE 3+ VIEW

[x wrist pa right]
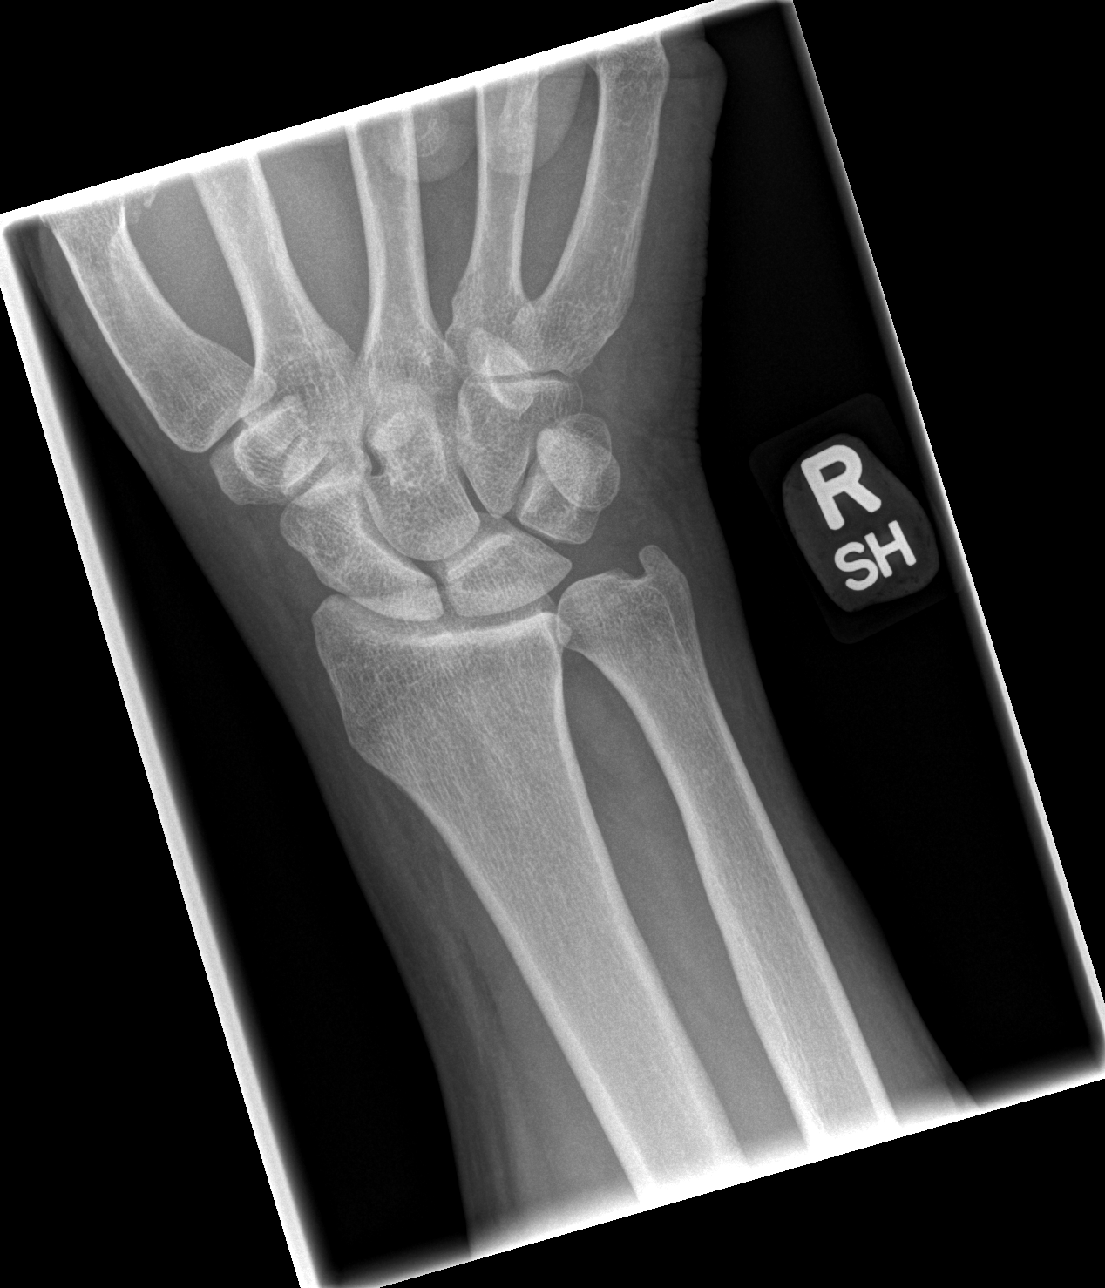

[x wrist obl right]
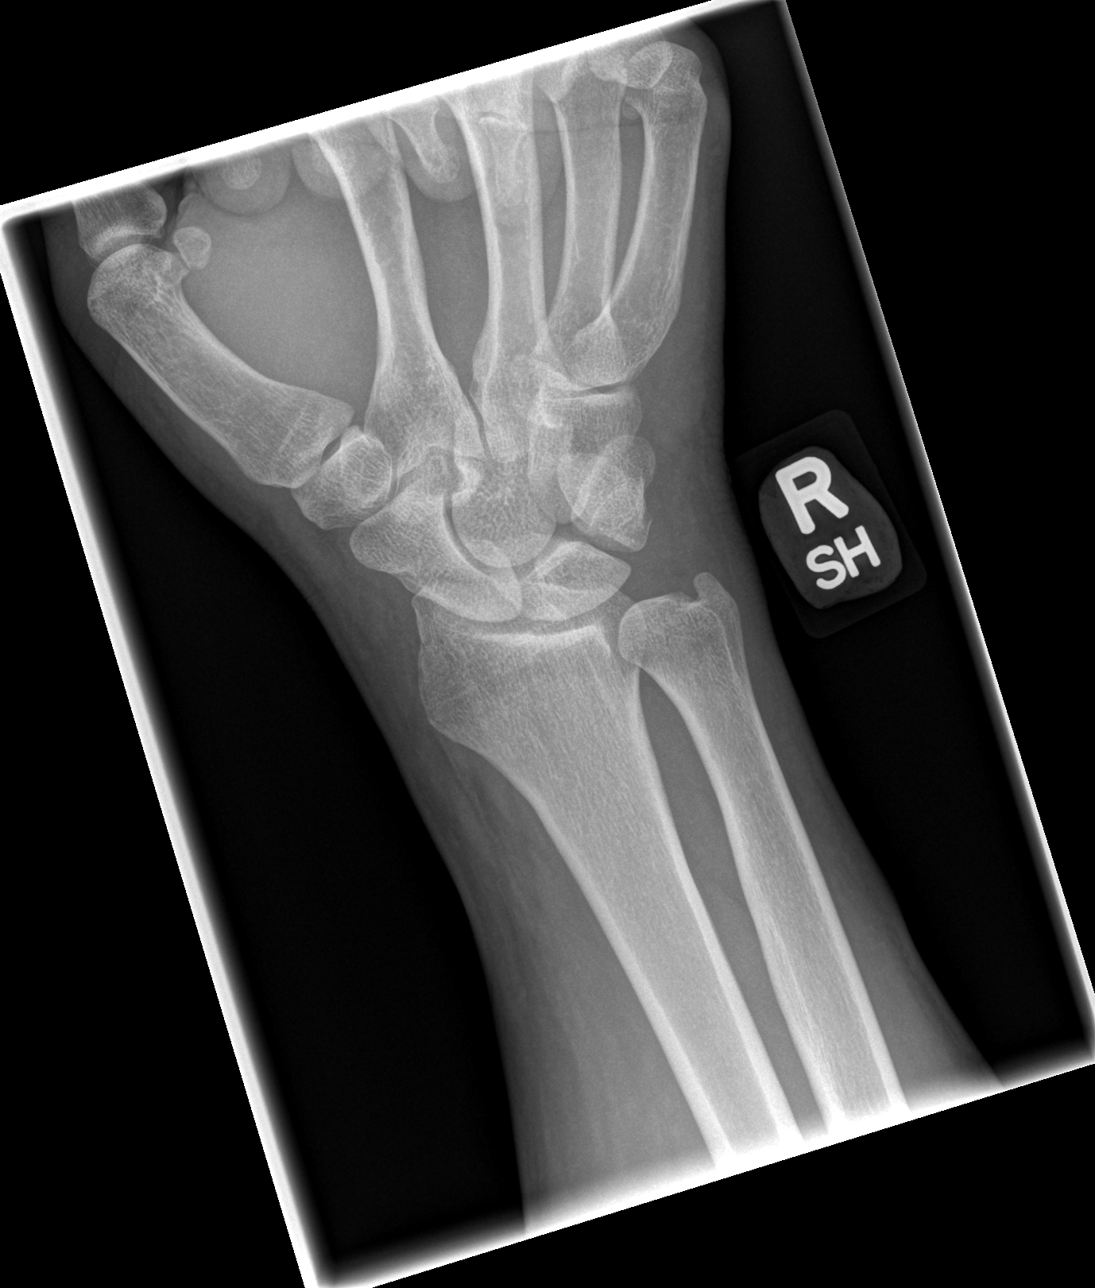

[x navicular]
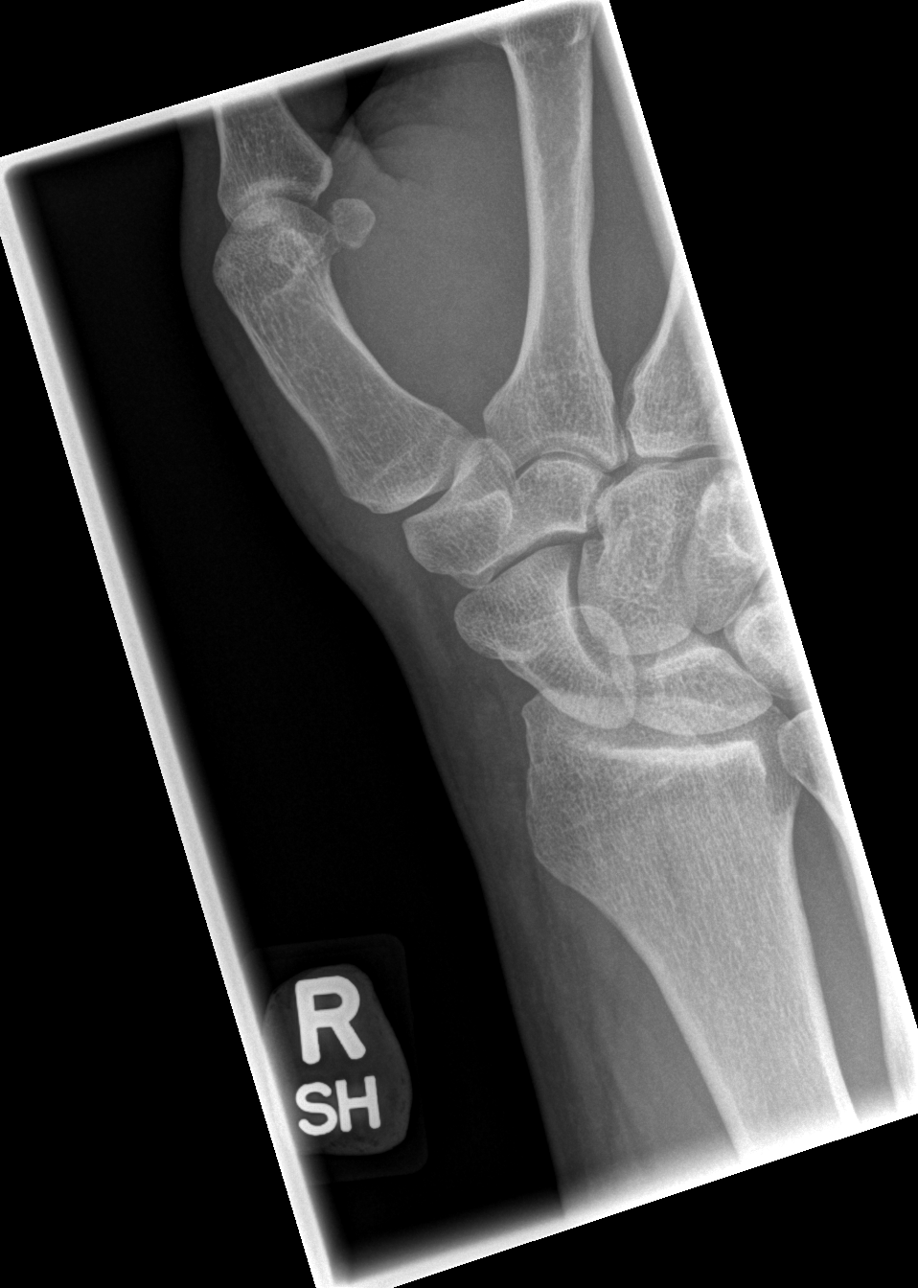

[x wrist lat right]
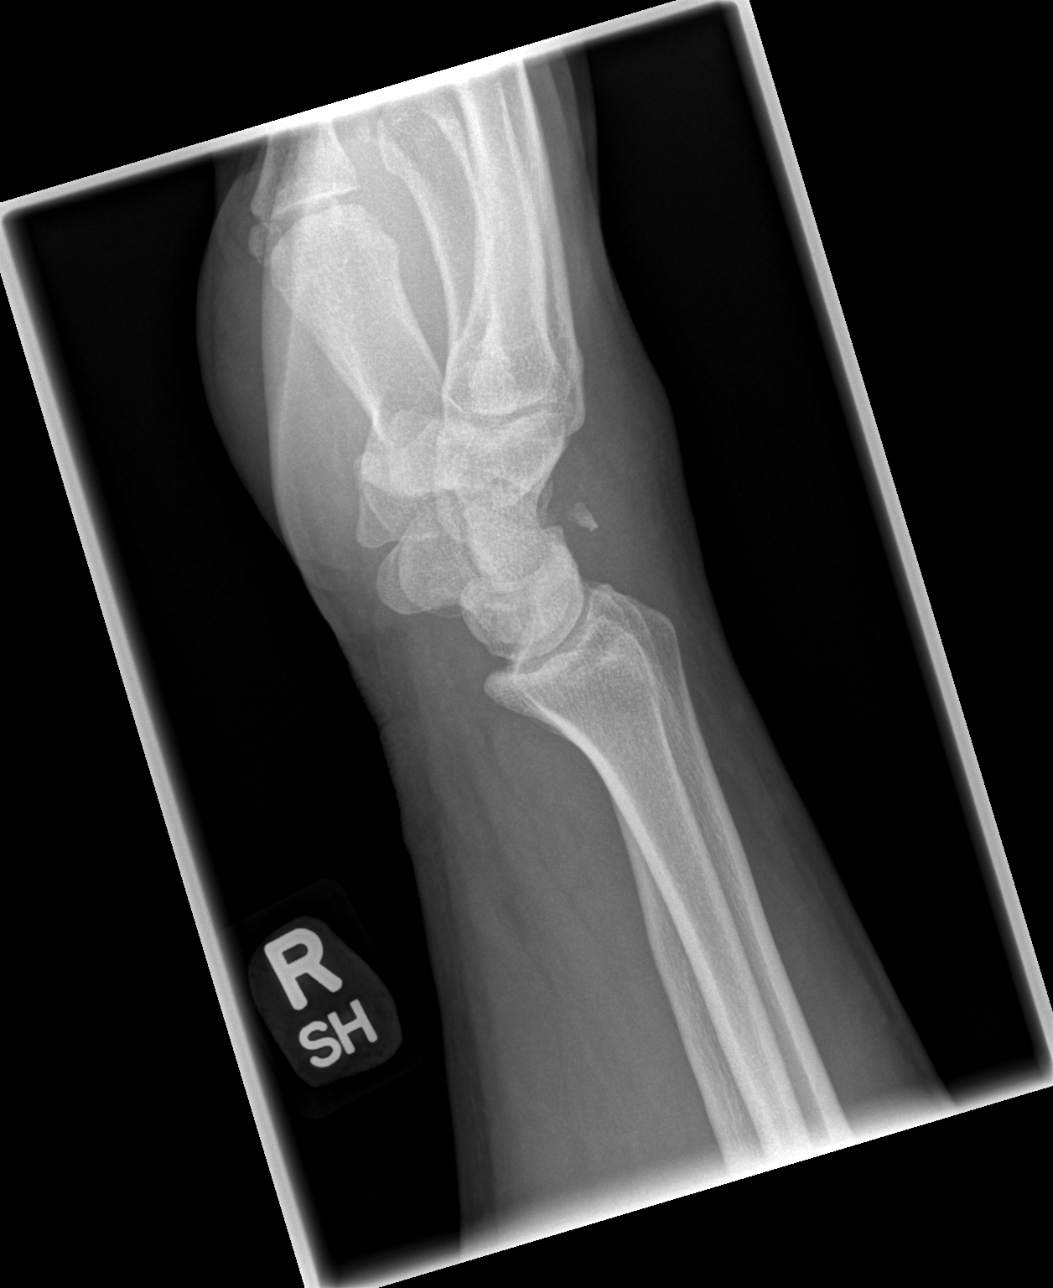

[4 of 4 positions shown; findings below may reference images not displayed]

FINDINGS: The triangular 6 millimeter acute appearing fracture fragment is
redemonstrated dorsal to the carpal bones at the wrist with regional
soft tissue swelling. Otherwise the carpal bones appear intact,
including the scaphoid. Normal carpal joint spaces and alignment.
Distal right radius and ulna appear intact. Metacarpals appear
intact.
IMPRESSION: 6 mm fracture fragment dorsal to the carpal bones compatible with
acute triquetrum fracture. Regional soft tissue swelling.

## 2018-01-10 IMAGING — CR DG HAND COMPLETE 3+V*R*
3 series · 3 of 3 positions shown · non-contrast
Comparison: Right hand series [DATE].

CLINICAL DATA: 51-year-old male status post fall last night with
hand and wrist pain.

EXAM:
RIGHT HAND - COMPLETE 3+ VIEW

[x hand pa right]
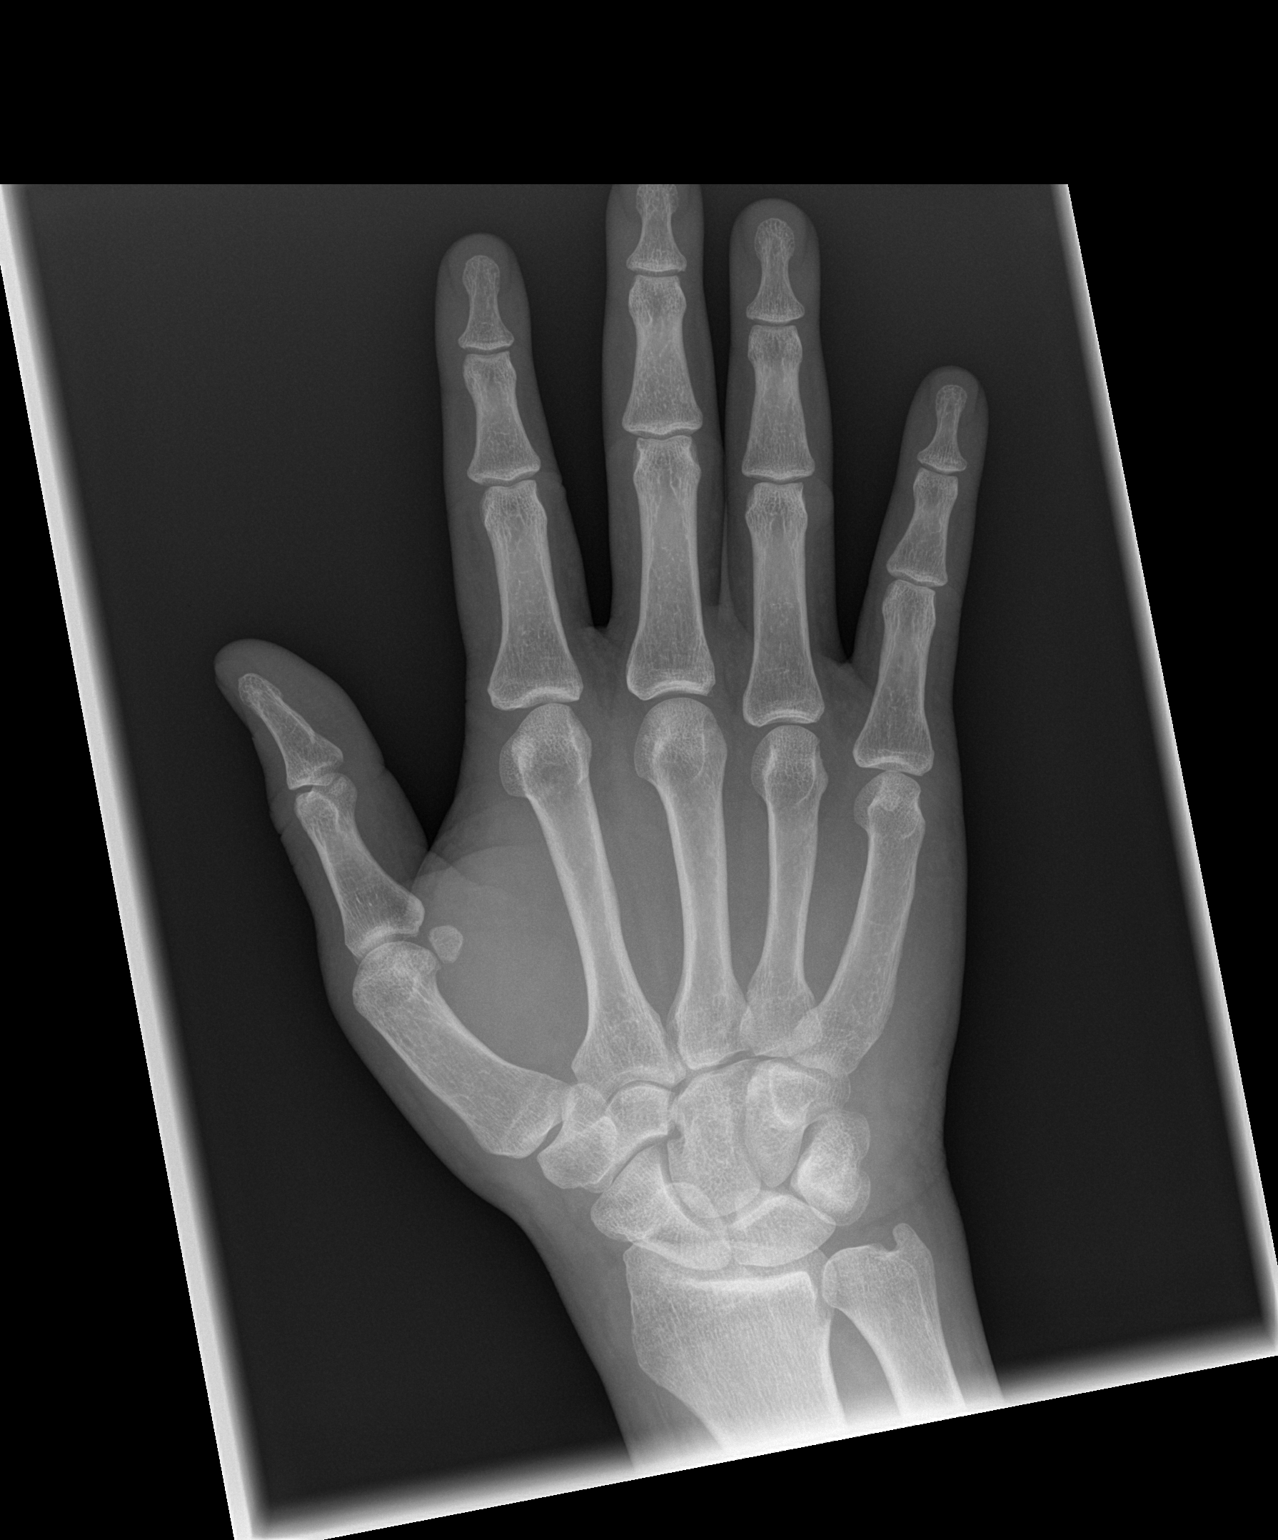

[x hand oblique right]
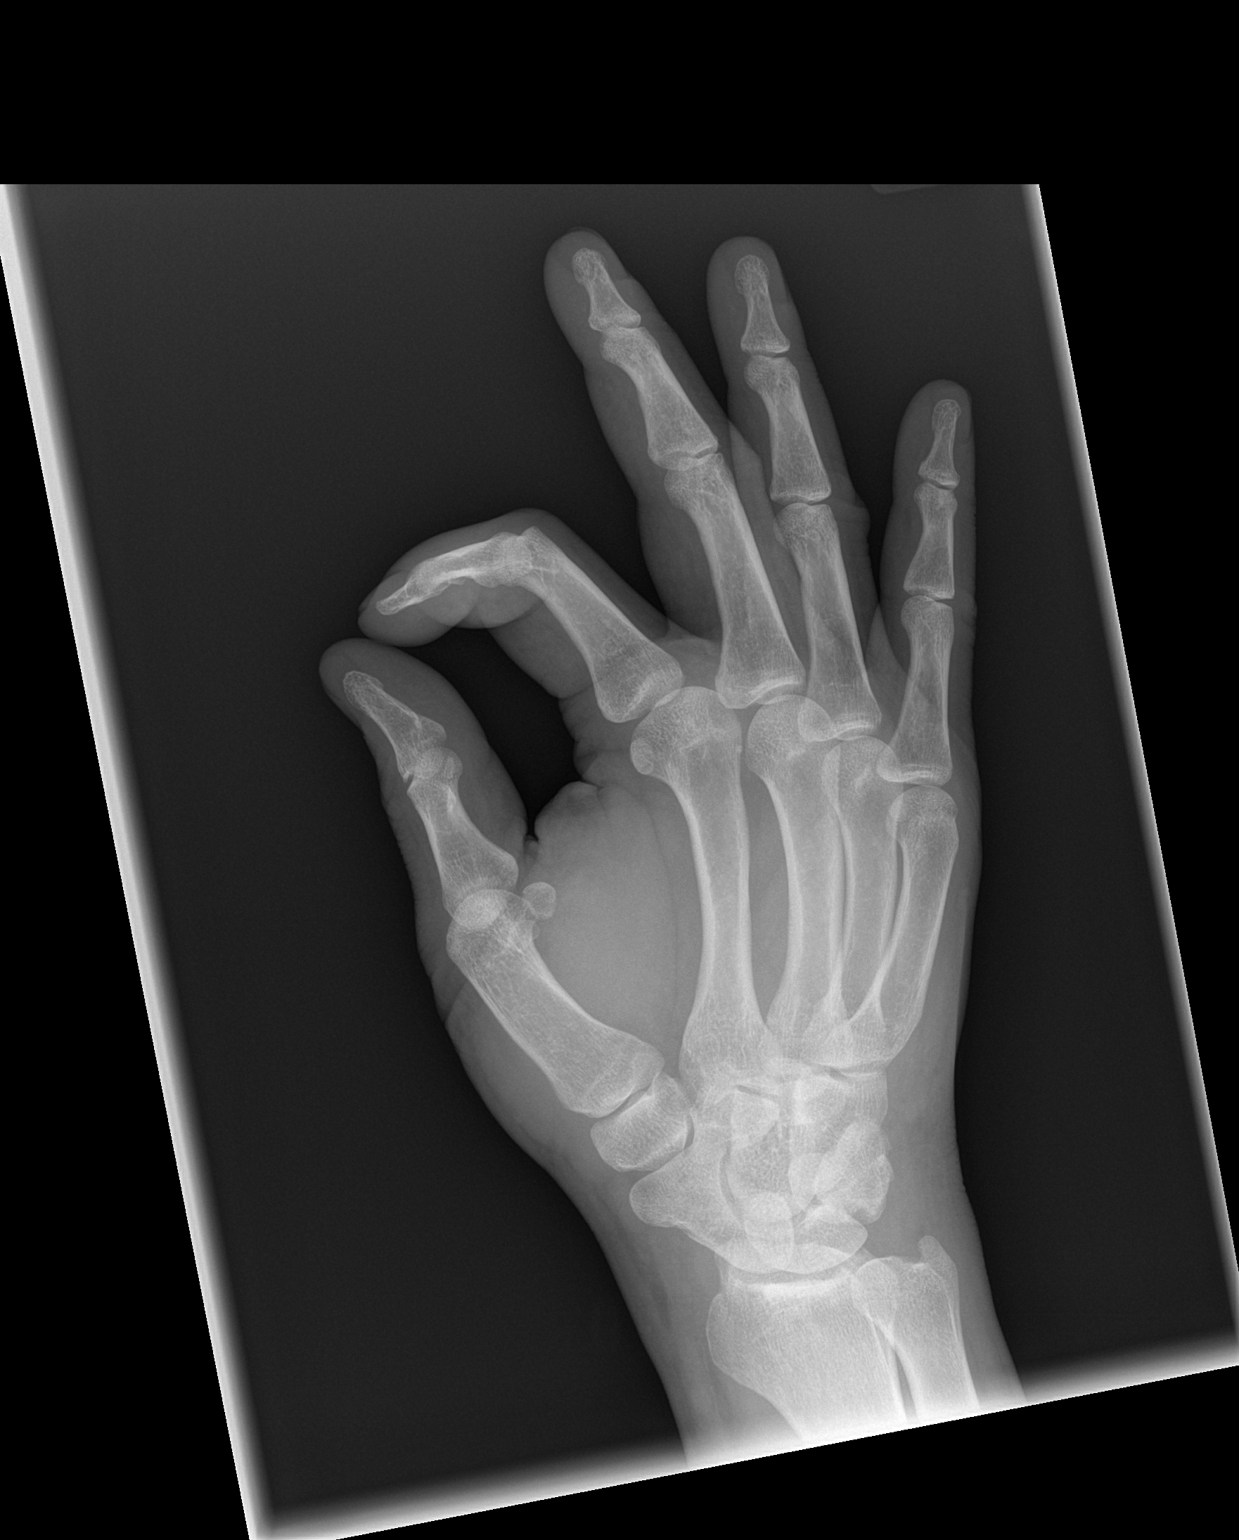

[x hand lat right]
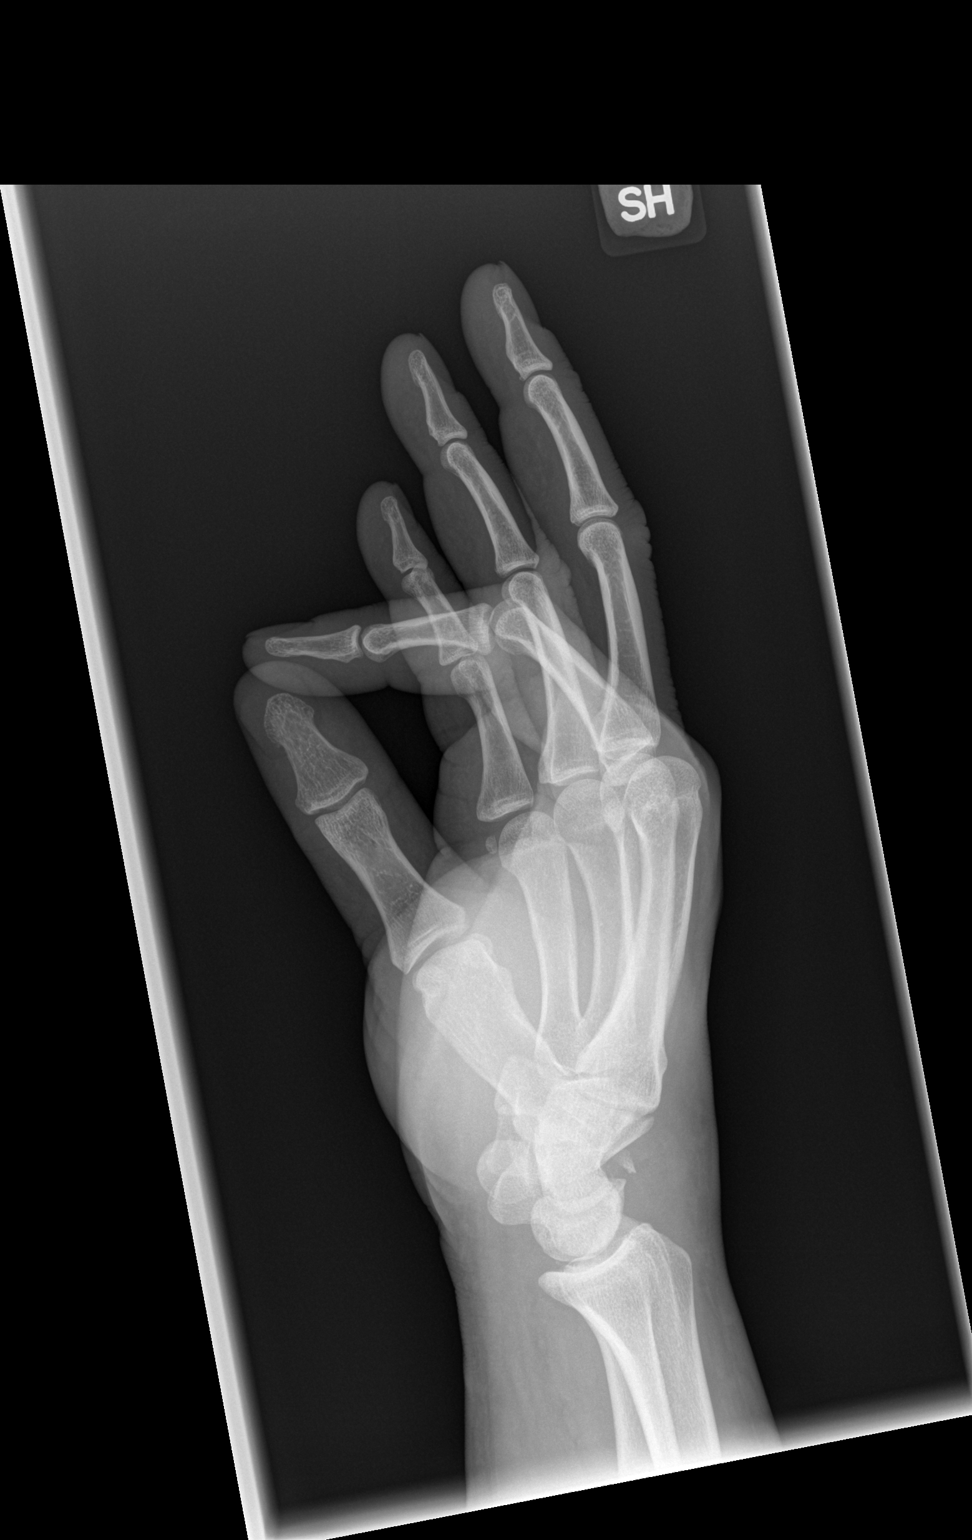

[3 of 3 positions shown; findings below may reference images not displayed]

FINDINGS: Bone mineralization remains normal. A small triangular fracture
fragment measuring 6 millimeters is located dorsal to the carpal
bones at the wrist. Carpal bone alignment is maintained. The
metacarpals and phalanges appear intact. Joint spaces are normal.
Distal right radius and ulna appear intact.
IMPRESSION: 1. Small 6 millimeter fracture fragment located dorsal to the carpal
bones. See Wrist series.
2. No other acute fracture or dislocation identified about the right
hand.

## 2018-01-10 NOTE — ED Triage Notes (Signed)
Reports fall onto left hand yesterday.  Denies any other injuries, swelling to hand and wrist noted.
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
## 2018-01-16 ENCOUNTER — Ambulatory Visit: Payer: Medicaid Other | Admitting: Family Medicine

## 2018-01-16 ENCOUNTER — Emergency Department (HOSPITAL_BASED_OUTPATIENT_CLINIC_OR_DEPARTMENT_OTHER)
Admission: EM | Admit: 2018-01-16 | Discharge: 2018-01-16 | Disposition: A | Discharge status: Home / Self Care | Payer: Medicaid Other | Attending: Emergency Medicine | Admitting: Emergency Medicine

## 2018-01-16 ENCOUNTER — Encounter (HOSPITAL_BASED_OUTPATIENT_CLINIC_OR_DEPARTMENT_OTHER): Payer: Self-pay

## 2018-01-16 ENCOUNTER — Encounter: Payer: Self-pay | Admitting: Family Medicine

## 2018-01-16 DIAGNOSIS — E119 Type 2 diabetes mellitus without complications: Secondary | ICD-10-CM | POA: Insufficient documentation

## 2018-01-16 DIAGNOSIS — J45909 Unspecified asthma, uncomplicated: Secondary | ICD-10-CM | POA: Diagnosis present

## 2018-01-16 DIAGNOSIS — S6991XA Unspecified injury of right wrist, hand and finger(s), initial encounter: Secondary | ICD-10-CM

## 2018-01-16 DIAGNOSIS — I10 Essential (primary) hypertension: Secondary | ICD-10-CM | POA: Diagnosis not present

## 2018-01-16 DIAGNOSIS — Z79899 Other long term (current) drug therapy: Secondary | ICD-10-CM | POA: Diagnosis not present

## 2018-01-16 DIAGNOSIS — J4541 Moderate persistent asthma with (acute) exacerbation: Secondary | ICD-10-CM | POA: Diagnosis not present

## 2018-01-16 MED ORDER — IPRATROPIUM-ALBUTEROL 0.5-2.5 (3) MG/3ML IN SOLN
RESPIRATORY_TRACT | Status: AC
  Administered 2018-01-16: 3 mL
  Filled 2018-01-16: qty 3

## 2018-01-16 MED ORDER — ALBUTEROL SULFATE (2.5 MG/3ML) 0.083% IN NEBU
INHALATION_SOLUTION | RESPIRATORY_TRACT | Status: AC
  Administered 2018-01-16: 2.5 mg
  Filled 2018-01-16: qty 3

## 2018-01-16 MED ORDER — PREDNISONE 10 MG PO TABS: 20.0000 mg | ORAL_TABLET | Freq: Every day | ORAL | 0 refills | Status: DC

## 2018-01-16 MED ORDER — PREDNISONE 20 MG PO TABS
40.0000 mg | ORAL_TABLET | Freq: Once | ORAL | Status: AC
  Administered 2018-01-16: 40 mg via ORAL
  Filled 2018-01-16: qty 2

## 2018-01-16 MED FILL — predniSONE 10 MG TABS: 10 | 8 days supply | Qty: 16 | Fill #0

## 2018-01-16 NOTE — ED Triage Notes (Signed)
Pt c/o asthma exacerbation x1wk, used neb tx this am with some relief but afraid its going to get worse

## 2018-01-16 NOTE — Patient Instructions (Signed)
You have a dorsal avulsion fracture of your triquetrum. This should heal well over a total of 6 weeks. Wear the wrist brace except to wash the area, ice it, if driving over the next 5 weeks. Tylenol, ibuprofen only if needed. Follow up with me in 5 weeks.
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
## 2018-01-17 ENCOUNTER — Encounter

## 2018-01-19 ENCOUNTER — Encounter: Payer: Self-pay | Admitting: Family Medicine

## 2018-01-19 DIAGNOSIS — S6991XA Unspecified injury of right wrist, hand and finger(s), initial encounter: Secondary | ICD-10-CM | POA: Insufficient documentation

## 2018-01-19 NOTE — Assessment & Plan Note (Signed)
independently reviewed radiographs and noted dorsal avulsion fracture off the triquetrum.  Reassured patient.  Wrist brace for 6 total weeks, f/u in 5 weeks.  Icing, tylenol, ibuprofen if needed.
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
## 2018-01-28 ENCOUNTER — Telehealth: Payer: Self-pay | Admitting: *Deleted

## 2018-01-28 NOTE — Telephone Encounter (Signed)
If he cannot tolerate the brace he should just use a soft brace or a wrap of the wrist and follow-up with me when he's 6 weeks out because it's still expected to heal in a total of 6 weeks even without the wrist brace.  I would only recommend surgery if he still struggles despite 6+ weeks of conservative treatment.

## 2018-01-28 NOTE — Telephone Encounter (Signed)
Patient called and stated that he could not wear the wrist brace. Would like to see a surgeon about his wrist.

## 2018-01-29 NOTE — Telephone Encounter (Signed)
Spoke to patient. He said he will try to wear the brace more and will look into the soft brace.  He had no further questions or concerns at this time

## 2018-02-17 ENCOUNTER — Ambulatory Visit (INDEPENDENT_AMBULATORY_CARE_PROVIDER_SITE_OTHER): Payer: Medicaid Other | Admitting: Family Medicine

## 2018-02-17 ENCOUNTER — Encounter: Payer: Self-pay | Admitting: Family Medicine

## 2018-02-17 ENCOUNTER — Ambulatory Visit (HOSPITAL_BASED_OUTPATIENT_CLINIC_OR_DEPARTMENT_OTHER)
Admission: RE | Admit: 2018-02-17 | Discharge: 2018-02-17 | Disposition: A | Discharge status: Home / Self Care | Payer: Medicaid Other | Source: Ambulatory Visit | Attending: Family Medicine | Admitting: Family Medicine

## 2018-02-17 VITALS — BP 164/107 | HR 74 | Ht 75.0 in | Wt 267.0 lb

## 2018-02-17 DIAGNOSIS — S6991XD Unspecified injury of right wrist, hand and finger(s), subsequent encounter: Secondary | ICD-10-CM

## 2018-02-17 IMAGING — DX DG WRIST COMPLETE 3+V*R*
4 series · 4 of 4 positions shown · non-contrast
Comparison: [DATE]

CLINICAL DATA: Recent avulsion fracture along the dorsal triquetral
bone

EXAM:
RIGHT WRIST - COMPLETE 3+ VIEW

[wrist pa]
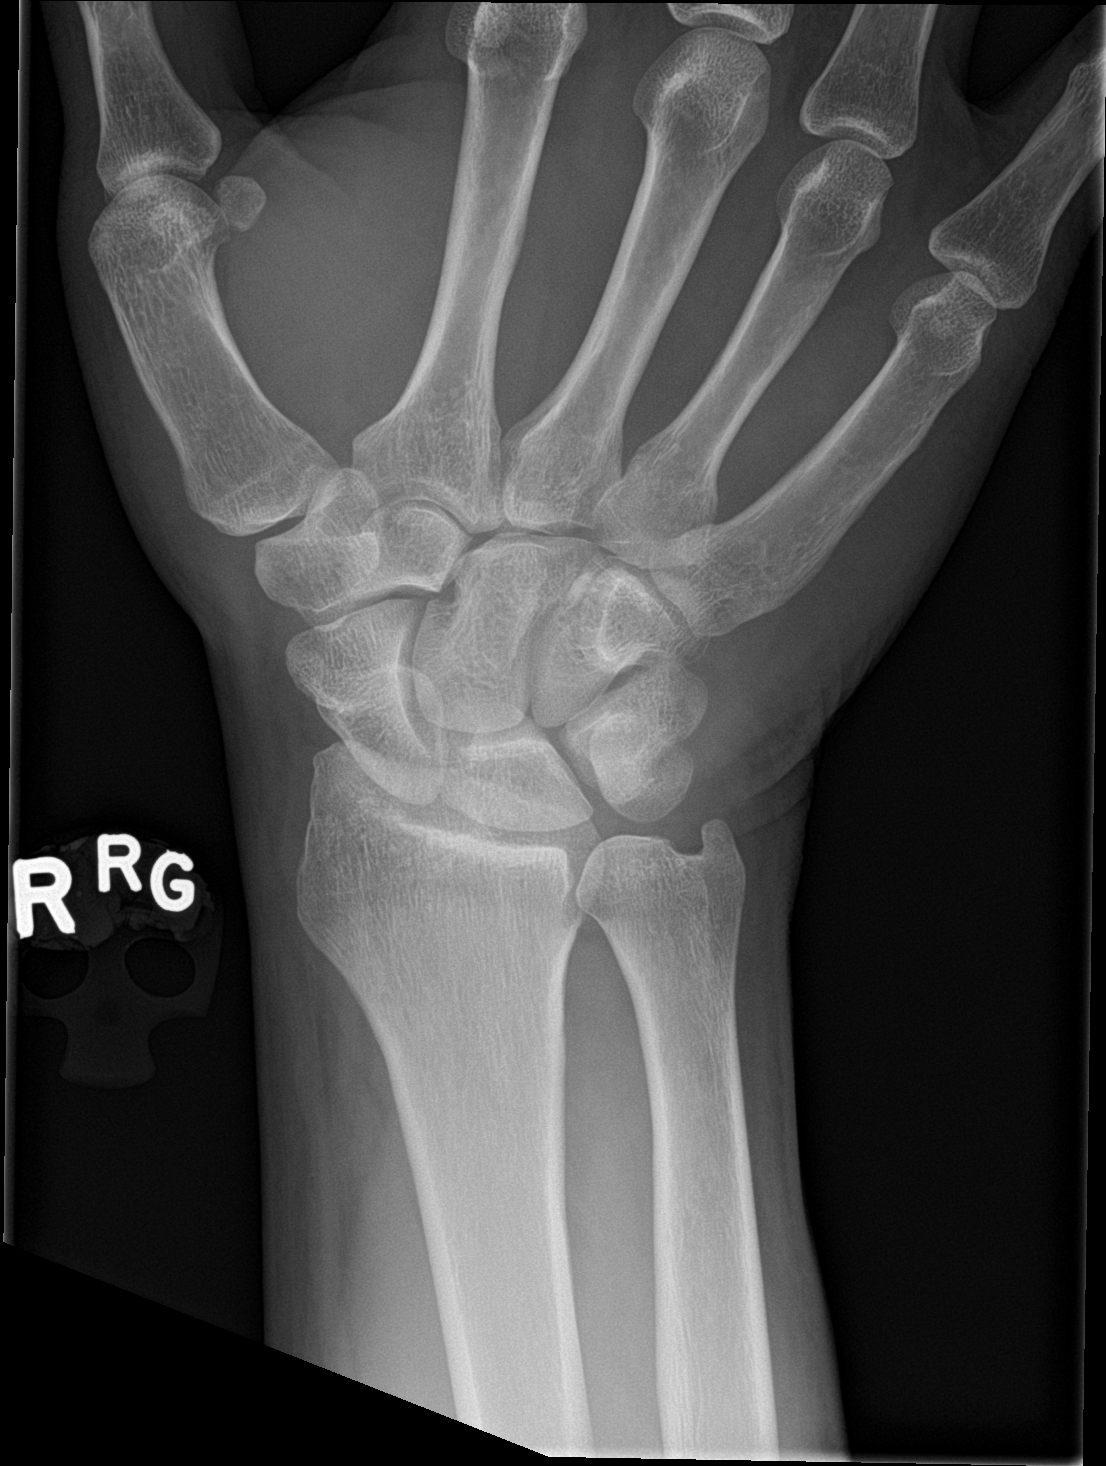

[wrist obl]
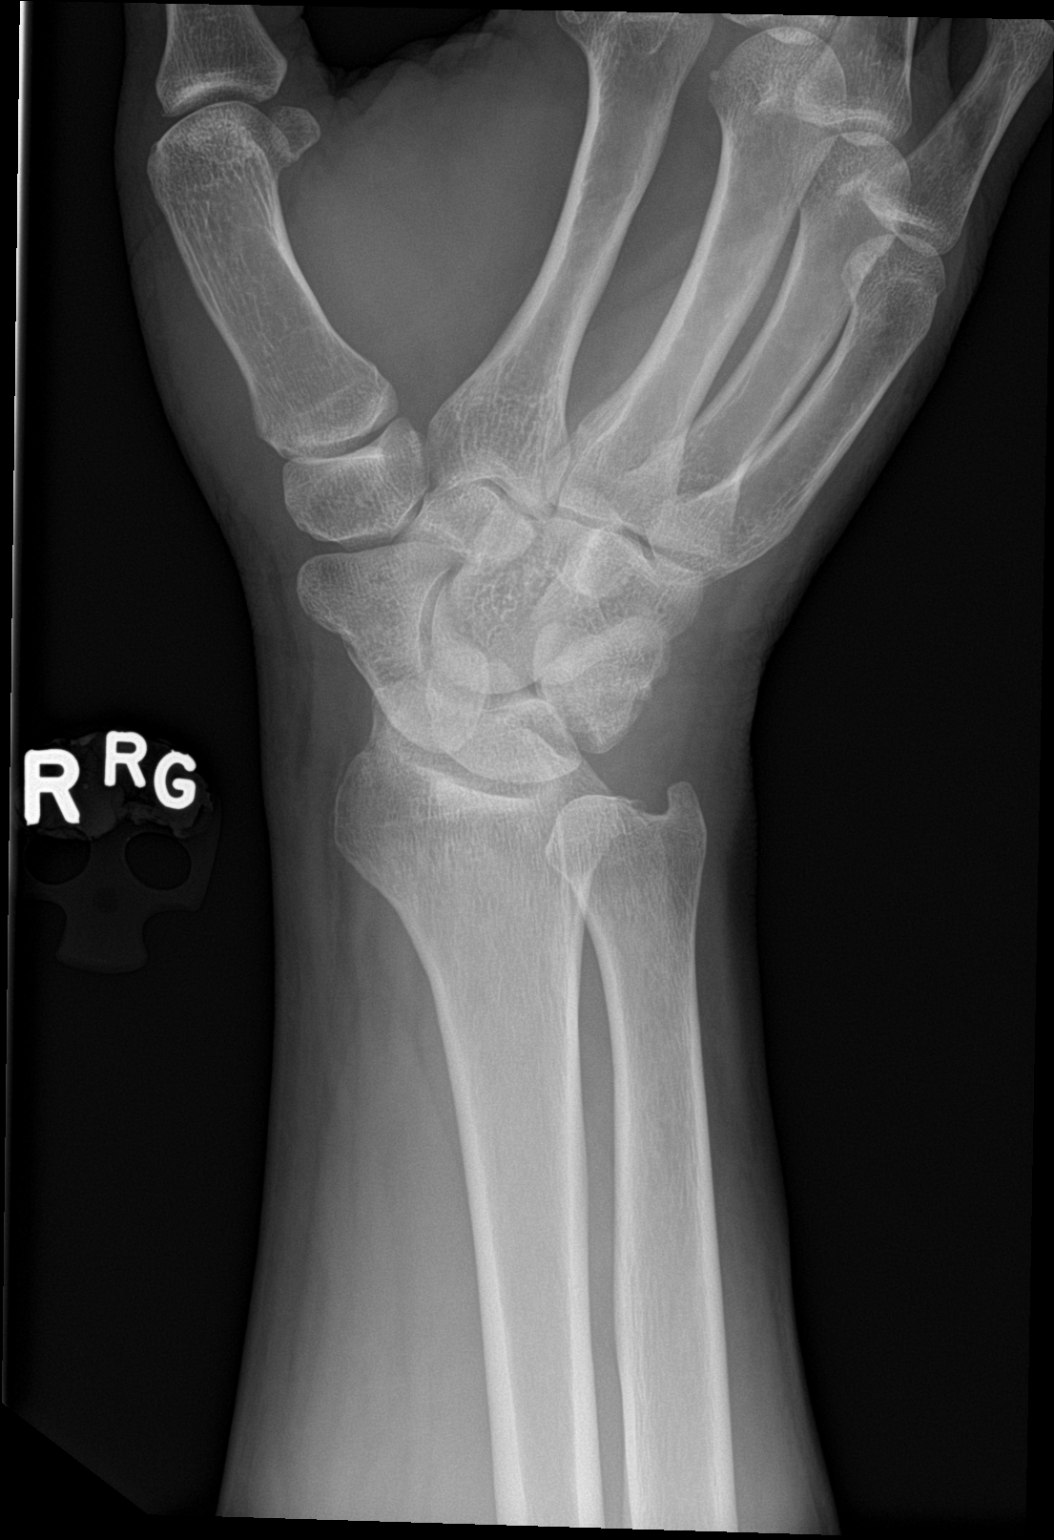

[wrist lat]
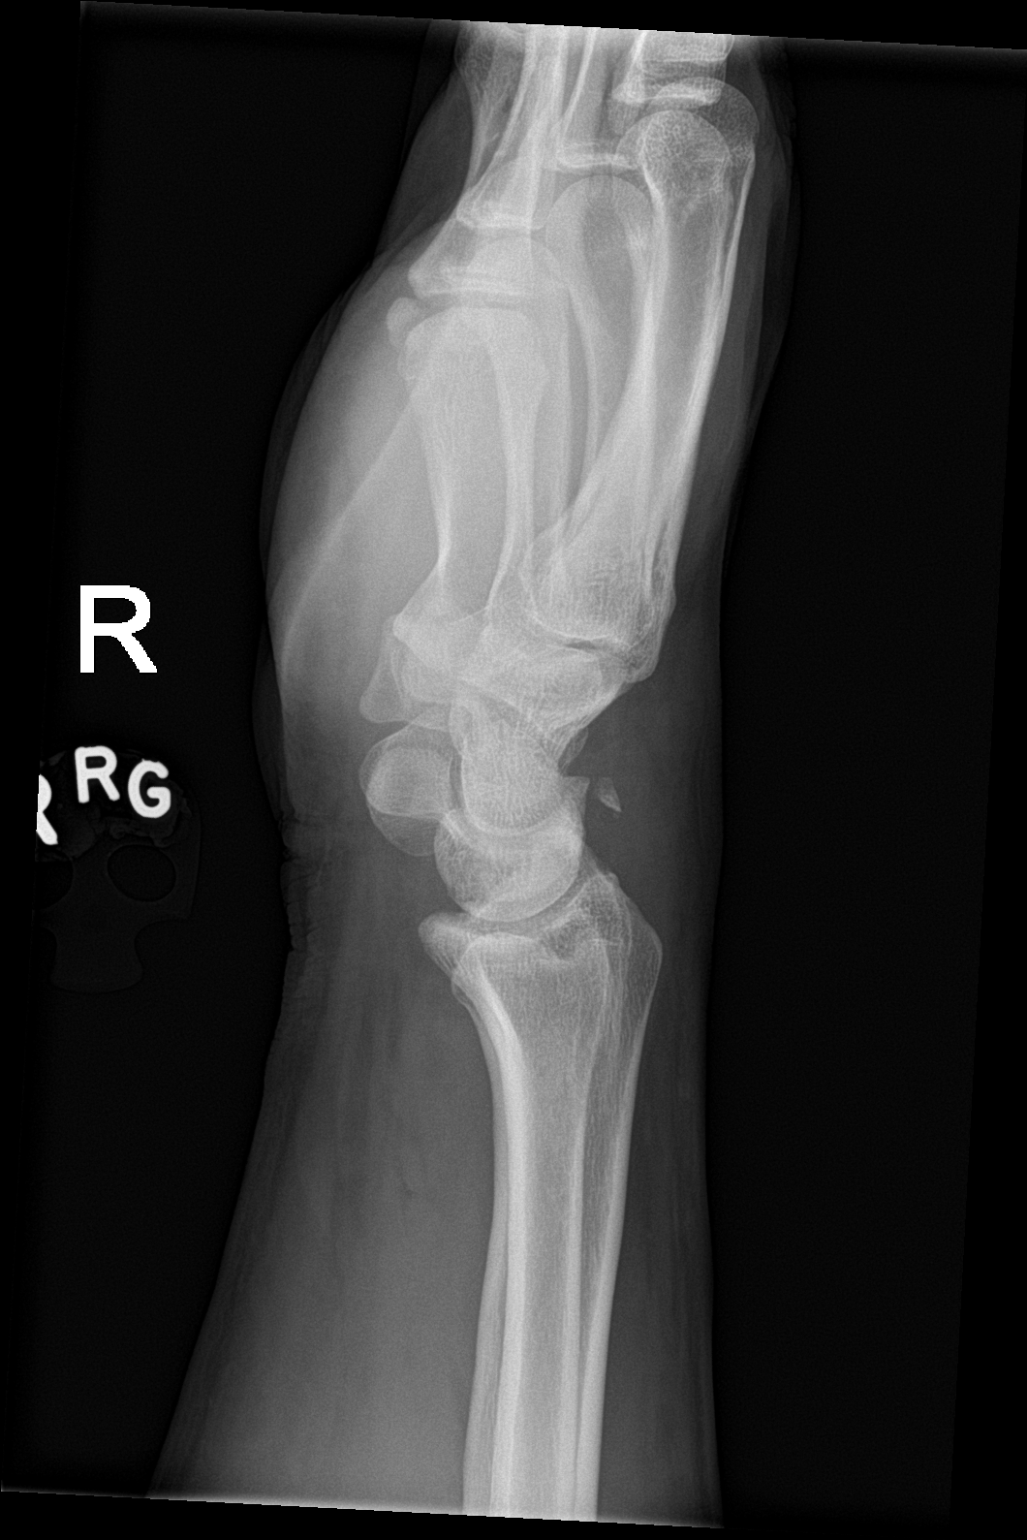

[wrist navicular]
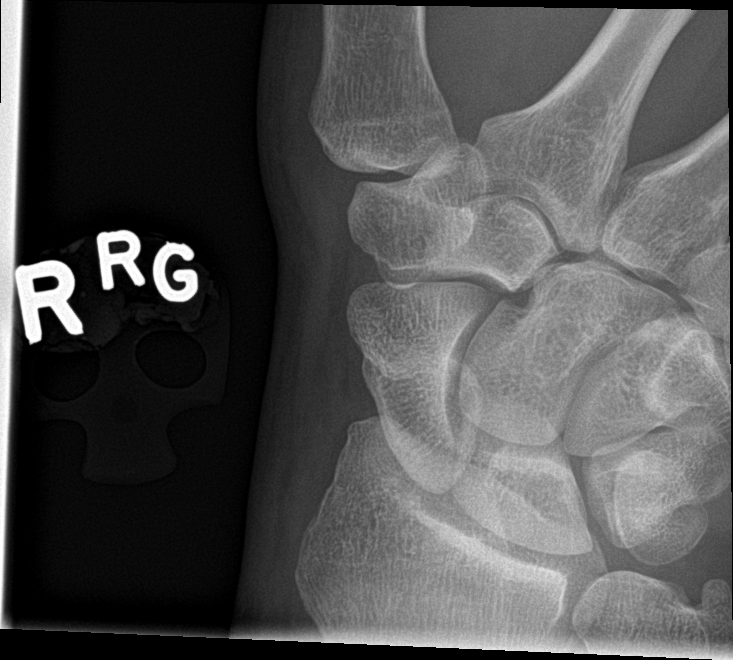

[4 of 4 positions shown; findings below may reference images not displayed]

FINDINGS: Frontal, oblique, lateral, and ulnar deviation scaphoid images were
obtained. The avulsion arising from the dorsal triquetrum is again
noted without appreciable change. There is mild callus formation in
this area. No new fracture. No dislocation. Joint spaces appear
normal. No erosive change.
IMPRESSION: Persistent avulsion along the dorsal triquetrum with mild healing
response. Alignment unchanged. No new fracture. No dislocation. No
arthropathy.
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
## 2018-02-17 NOTE — Patient Instructions (Signed)
You have a dorsal avulsion fracture of your triquetrum. Consider therapy - call me if you want to do this though insurance would only allow one visit. Icing, tylenol, ibuprofen only if needed. Typically I'd still recommend using a brace to rest this as needed at this point. If not improving I'd consider MRI as the next step then consider hand surgery referral but almost all of these types of fractures heal without needing surgery.

## 2018-02-20 ENCOUNTER — Ambulatory Visit: Payer: Medicaid Other | Admitting: Family Medicine

## 2018-03-04 ENCOUNTER — Encounter (HOSPITAL_BASED_OUTPATIENT_CLINIC_OR_DEPARTMENT_OTHER): Payer: Self-pay | Admitting: Emergency Medicine

## 2018-03-04 ENCOUNTER — Emergency Department (HOSPITAL_BASED_OUTPATIENT_CLINIC_OR_DEPARTMENT_OTHER)
Admission: EM | Admit: 2018-03-04 | Discharge: 2018-03-04 | Disposition: A | Discharge status: Home / Self Care | Payer: Medicaid Other | Attending: Emergency Medicine | Admitting: Emergency Medicine

## 2018-03-04 ENCOUNTER — Emergency Department (HOSPITAL_BASED_OUTPATIENT_CLINIC_OR_DEPARTMENT_OTHER): Payer: Medicaid Other

## 2018-03-04 ENCOUNTER — Other Ambulatory Visit: Payer: Self-pay

## 2018-03-04 DIAGNOSIS — R05 Cough: Secondary | ICD-10-CM | POA: Diagnosis present

## 2018-03-04 DIAGNOSIS — I10 Essential (primary) hypertension: Secondary | ICD-10-CM | POA: Diagnosis not present

## 2018-03-04 DIAGNOSIS — J4521 Mild intermittent asthma with (acute) exacerbation: Secondary | ICD-10-CM | POA: Insufficient documentation

## 2018-03-04 DIAGNOSIS — Z79899 Other long term (current) drug therapy: Secondary | ICD-10-CM | POA: Insufficient documentation

## 2018-03-04 DIAGNOSIS — J189 Pneumonia, unspecified organism: Secondary | ICD-10-CM | POA: Insufficient documentation

## 2018-03-04 DIAGNOSIS — Z7984 Long term (current) use of oral hypoglycemic drugs: Secondary | ICD-10-CM | POA: Diagnosis not present

## 2018-03-04 DIAGNOSIS — E119 Type 2 diabetes mellitus without complications: Secondary | ICD-10-CM | POA: Diagnosis not present

## 2018-03-04 DIAGNOSIS — J45901 Unspecified asthma with (acute) exacerbation: Secondary | ICD-10-CM

## 2018-03-04 IMAGING — CR DG CHEST 2V
2 series · 2 of 2 positions shown · non-contrast
Comparison: Chest x-ray of [DATE]

CLINICAL DATA: Four days of cough, shortness of breath, and
wheezing.

EXAM:
CHEST - 2 VIEW

[w chest pa]
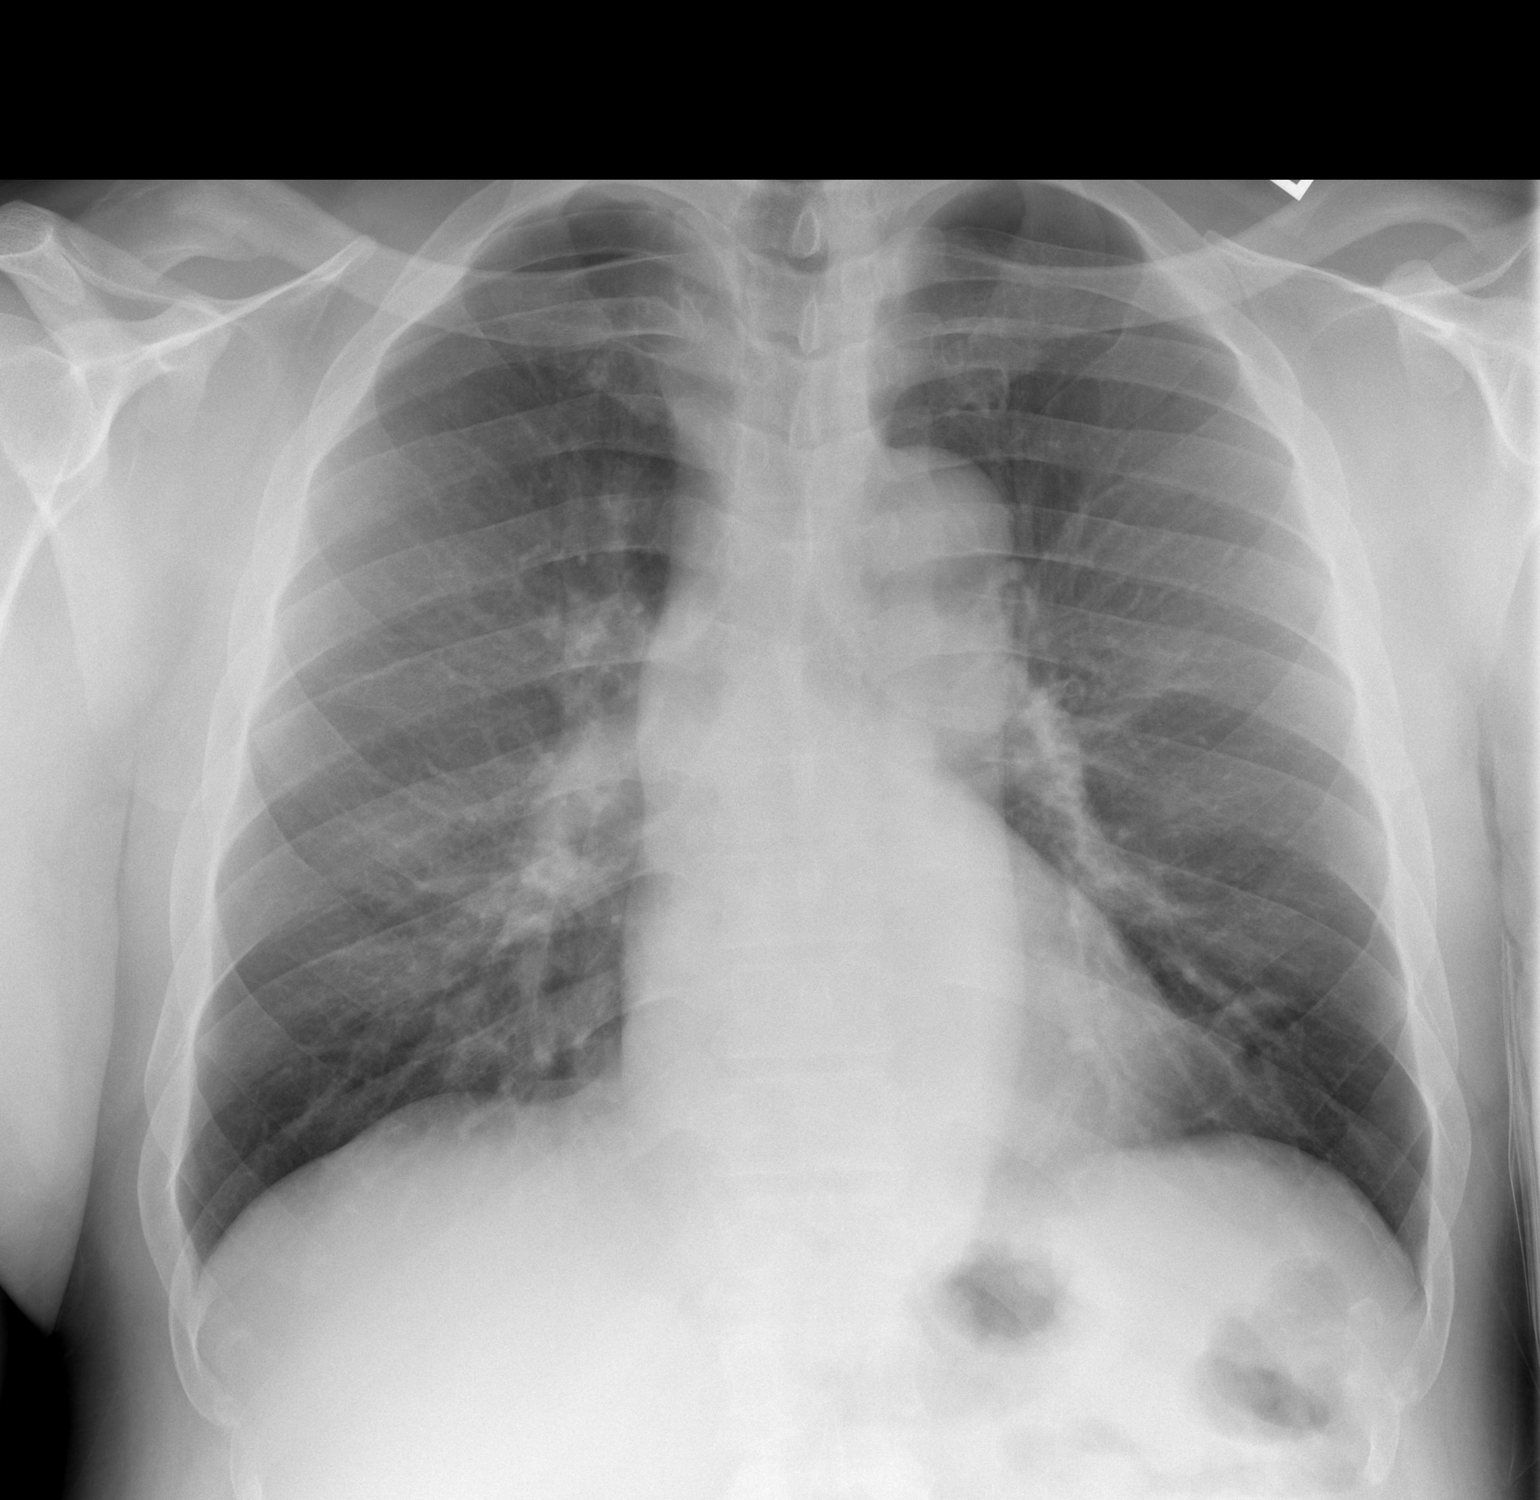

[w chest lat]
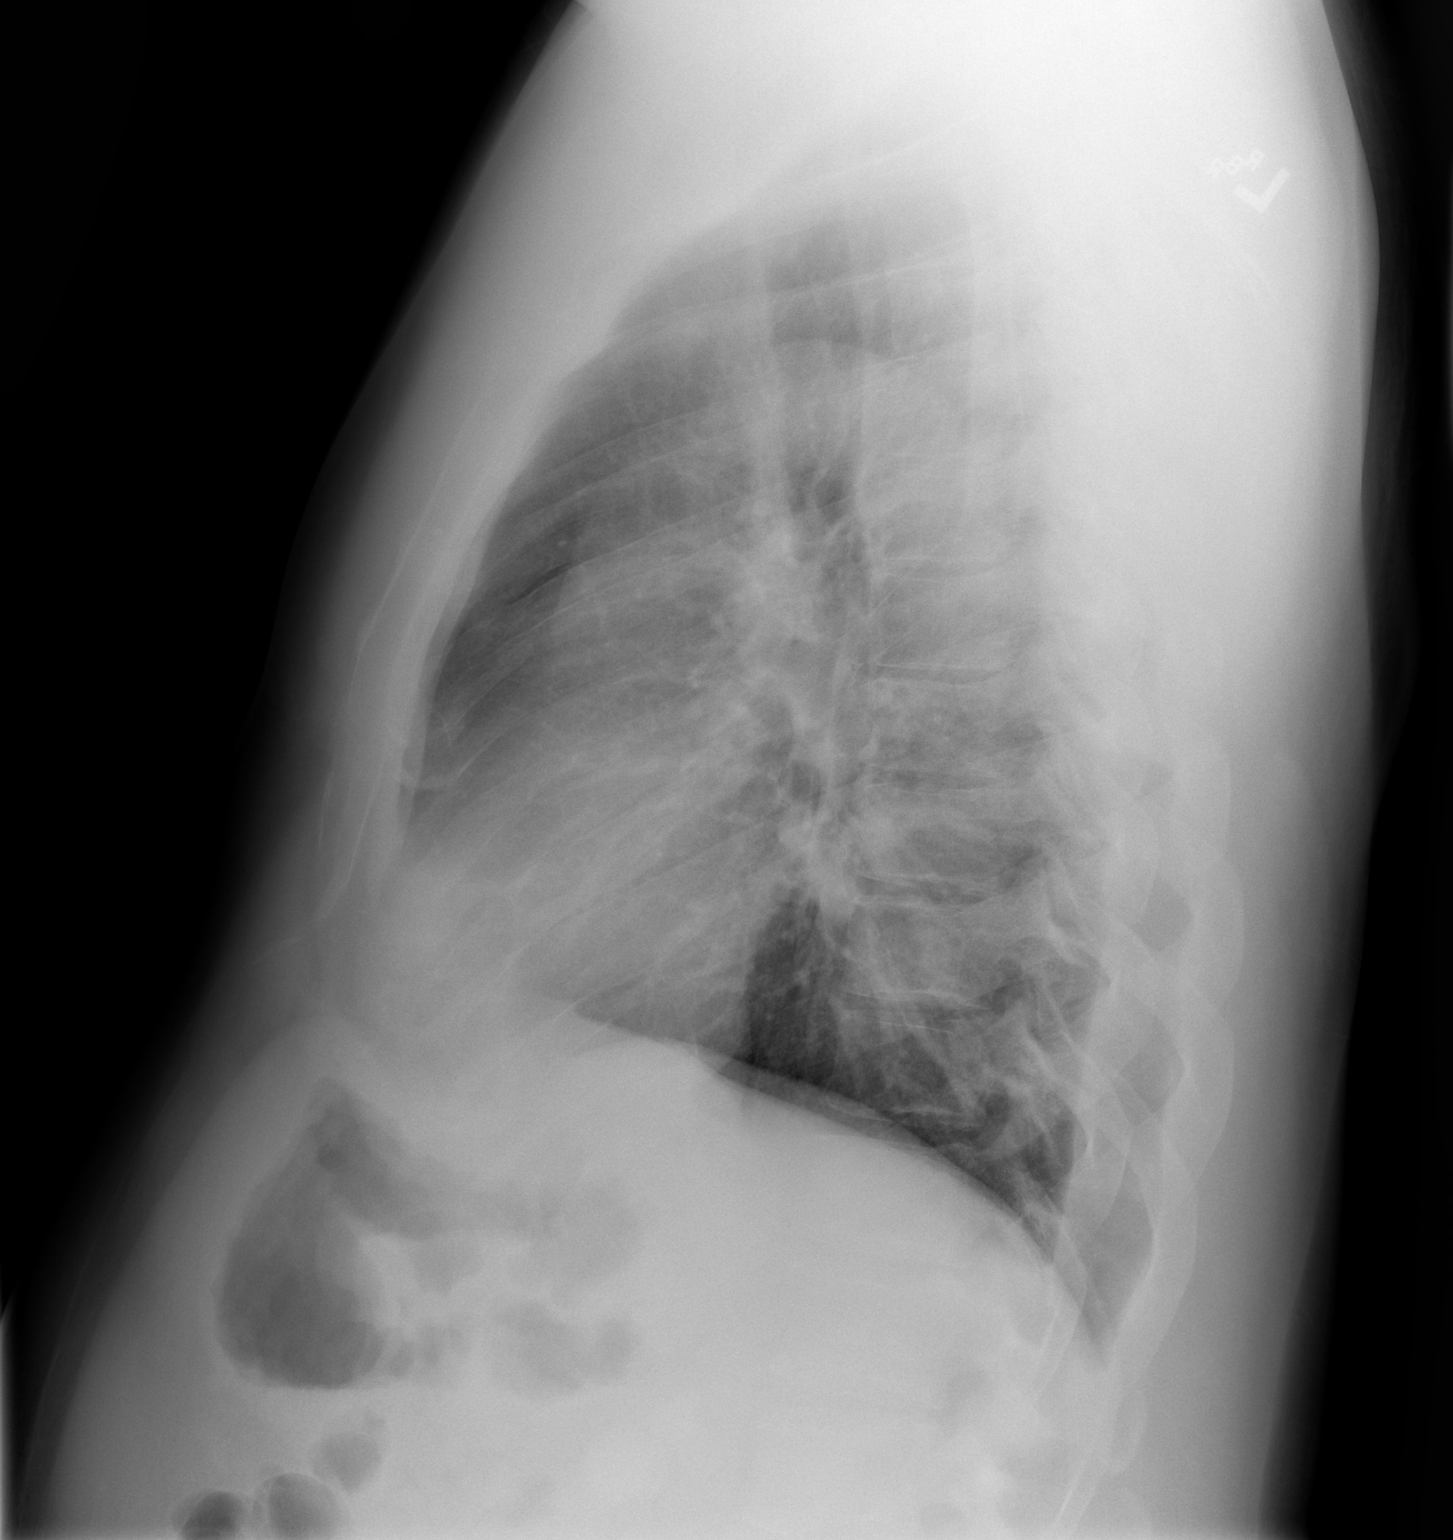

[2 of 2 positions shown; findings below may reference images not displayed]

FINDINGS: The lungs are well-expanded. There are subtle increased densities
noted in the left mid and lower lung. There is no pleural effusion.
The heart and pulmonary vascularity are normal. The mediastinum is
normal in width. The bony thorax is unremarkable.
IMPRESSION: Subsegmental atelectasis or early pneumonia in the left mid and
lower lung. Followup PA and lateral chest X-ray is recommended in
3-4 weeks following trial of antibiotic therapy to ensure resolution
and exclude underlying malignancy.

## 2018-03-04 MED ORDER — ALBUTEROL SULFATE (2.5 MG/3ML) 0.083% IN NEBU
INHALATION_SOLUTION | RESPIRATORY_TRACT | Status: AC
  Administered 2018-03-04: 2.5 mg
  Filled 2018-03-04: qty 3

## 2018-03-04 MED ORDER — PREDNISONE 20 MG PO TABS: 60.0000 mg | ORAL_TABLET | Freq: Every day | ORAL | 0 refills | Status: AC

## 2018-03-04 MED ORDER — LEVOFLOXACIN 500 MG PO TABS: 500.0000 mg | ORAL_TABLET | Freq: Every day | ORAL | 0 refills | Status: AC

## 2018-03-04 MED ORDER — PREDNISONE 10 MG PO TABS
60.0000 mg | ORAL_TABLET | Freq: Once | ORAL | Status: AC
  Administered 2018-03-04: 60 mg via ORAL
  Filled 2018-03-04: qty 1

## 2018-03-04 MED ORDER — IPRATROPIUM-ALBUTEROL 0.5-2.5 (3) MG/3ML IN SOLN
RESPIRATORY_TRACT | Status: AC
  Administered 2018-03-04: 3 mL
  Filled 2018-03-04: qty 3

## 2018-03-04 MED FILL — levoFLOXacin 500 MG TABS: 500 | 7 days supply | Qty: 7 | Fill #0

## 2018-03-04 MED FILL — predniSONE 20 MG TABS: 20 | 4 days supply | Qty: 12 | Fill #0

## 2018-03-04 NOTE — ED Triage Notes (Signed)
Productive Cough since Friday.  Pt taking albuterol at home and doesn't seem to help, last tx this am.  No known fever
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
## 2018-04-12 ENCOUNTER — Emergency Department (HOSPITAL_BASED_OUTPATIENT_CLINIC_OR_DEPARTMENT_OTHER)
Admission: EM | Admit: 2018-04-12 | Discharge: 2018-04-12 | Disposition: A | Discharge status: Home / Self Care | Payer: Medicaid Other | Attending: Emergency Medicine | Admitting: Emergency Medicine

## 2018-04-12 ENCOUNTER — Encounter (HOSPITAL_BASED_OUTPATIENT_CLINIC_OR_DEPARTMENT_OTHER): Payer: Self-pay | Admitting: Emergency Medicine

## 2018-04-12 ENCOUNTER — Other Ambulatory Visit: Payer: Self-pay

## 2018-04-12 DIAGNOSIS — E119 Type 2 diabetes mellitus without complications: Secondary | ICD-10-CM | POA: Insufficient documentation

## 2018-04-12 DIAGNOSIS — Z79899 Other long term (current) drug therapy: Secondary | ICD-10-CM | POA: Diagnosis not present

## 2018-04-12 DIAGNOSIS — R0602 Shortness of breath: Secondary | ICD-10-CM | POA: Insufficient documentation

## 2018-04-12 DIAGNOSIS — J45909 Unspecified asthma, uncomplicated: Secondary | ICD-10-CM | POA: Insufficient documentation

## 2018-04-12 DIAGNOSIS — J4520 Mild intermittent asthma, uncomplicated: Secondary | ICD-10-CM

## 2018-04-12 DIAGNOSIS — Z7984 Long term (current) use of oral hypoglycemic drugs: Secondary | ICD-10-CM | POA: Insufficient documentation

## 2018-04-12 DIAGNOSIS — R062 Wheezing: Secondary | ICD-10-CM | POA: Diagnosis present

## 2018-04-12 DIAGNOSIS — I10 Essential (primary) hypertension: Secondary | ICD-10-CM | POA: Diagnosis not present

## 2018-04-12 MED ORDER — ALBUTEROL SULFATE (2.5 MG/3ML) 0.083% IN NEBU
2.5000 mg | INHALATION_SOLUTION | Freq: Once | RESPIRATORY_TRACT | Status: AC
Start: 2018-04-12 — End: 2018-04-12
  Administered 2018-04-12: 2.5 mg via RESPIRATORY_TRACT

## 2018-04-12 MED ORDER — BUDESONIDE-FORMOTEROL FUMARATE 160-4.5 MCG/ACT IN AERO: 2.0000 | INHALATION_SPRAY | Freq: Every day | RESPIRATORY_TRACT | 0 refills | Status: DC

## 2018-04-12 MED ORDER — PREDNISONE 20 MG PO TABS: ORAL_TABLET | ORAL | 0 refills | Status: DC

## 2018-04-12 MED ORDER — IPRATROPIUM-ALBUTEROL 0.5-2.5 (3) MG/3ML IN SOLN
RESPIRATORY_TRACT | Status: AC
  Administered 2018-04-12: 3 mL via RESPIRATORY_TRACT
  Filled 2018-04-12: qty 3

## 2018-04-12 MED ORDER — IPRATROPIUM-ALBUTEROL 0.5-2.5 (3) MG/3ML IN SOLN
3.0000 mL | Freq: Once | RESPIRATORY_TRACT | Status: AC
  Administered 2018-04-12: 3 mL via RESPIRATORY_TRACT

## 2018-04-12 MED ORDER — ALBUTEROL SULFATE (2.5 MG/3ML) 0.083% IN NEBU
INHALATION_SOLUTION | RESPIRATORY_TRACT | Status: AC
  Administered 2018-04-12: 2.5 mg via RESPIRATORY_TRACT
  Filled 2018-04-12: qty 3

## 2018-04-12 NOTE — ED Triage Notes (Signed)
SOB, with wheezing

## 2018-04-12 NOTE — ED Notes (Signed)
States," I am feeling better and I am ready to go" Awaiting dispo
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
## 2018-05-12 ENCOUNTER — Other Ambulatory Visit: Payer: Self-pay

## 2018-05-12 ENCOUNTER — Emergency Department (HOSPITAL_BASED_OUTPATIENT_CLINIC_OR_DEPARTMENT_OTHER)
Admission: EM | Admit: 2018-05-12 | Discharge: 2018-05-12 | Disposition: A | Discharge status: Home / Self Care | Payer: Medicaid Other | Attending: Emergency Medicine | Admitting: Emergency Medicine

## 2018-05-12 ENCOUNTER — Encounter (HOSPITAL_BASED_OUTPATIENT_CLINIC_OR_DEPARTMENT_OTHER): Payer: Self-pay | Admitting: Emergency Medicine

## 2018-05-12 DIAGNOSIS — E119 Type 2 diabetes mellitus without complications: Secondary | ICD-10-CM | POA: Insufficient documentation

## 2018-05-12 DIAGNOSIS — I10 Essential (primary) hypertension: Secondary | ICD-10-CM | POA: Insufficient documentation

## 2018-05-12 DIAGNOSIS — R062 Wheezing: Secondary | ICD-10-CM | POA: Diagnosis present

## 2018-05-12 DIAGNOSIS — Z79899 Other long term (current) drug therapy: Secondary | ICD-10-CM | POA: Insufficient documentation

## 2018-05-12 DIAGNOSIS — J45909 Unspecified asthma, uncomplicated: Secondary | ICD-10-CM | POA: Insufficient documentation

## 2018-05-12 DIAGNOSIS — J4521 Mild intermittent asthma with (acute) exacerbation: Secondary | ICD-10-CM | POA: Diagnosis not present

## 2018-05-12 DIAGNOSIS — Z7984 Long term (current) use of oral hypoglycemic drugs: Secondary | ICD-10-CM | POA: Diagnosis not present

## 2018-05-12 DIAGNOSIS — F329 Major depressive disorder, single episode, unspecified: Secondary | ICD-10-CM | POA: Insufficient documentation

## 2018-05-12 MED ORDER — PREDNISONE 50 MG PO TABS
60.0000 mg | ORAL_TABLET | Freq: Once | ORAL | Status: AC
  Administered 2018-05-12: 60 mg via ORAL
  Filled 2018-05-12: qty 1

## 2018-05-12 MED ORDER — IPRATROPIUM-ALBUTEROL 0.5-2.5 (3) MG/3ML IN SOLN
RESPIRATORY_TRACT | Status: AC
  Administered 2018-05-12: 3 mL via RESPIRATORY_TRACT
  Filled 2018-05-12: qty 3

## 2018-05-12 MED ORDER — ALBUTEROL SULFATE (2.5 MG/3ML) 0.083% IN NEBU
2.5000 mg | INHALATION_SOLUTION | Freq: Once | RESPIRATORY_TRACT | Status: AC
  Administered 2018-05-12: 2.5 mg via RESPIRATORY_TRACT

## 2018-05-12 MED ORDER — PREDNISONE 20 MG PO TABS: ORAL_TABLET | ORAL | 0 refills | Status: DC

## 2018-05-12 MED ORDER — IPRATROPIUM-ALBUTEROL 0.5-2.5 (3) MG/3ML IN SOLN
3.0000 mL | Freq: Once | RESPIRATORY_TRACT | Status: AC
  Administered 2018-05-12: 3 mL via RESPIRATORY_TRACT

## 2018-05-12 MED ORDER — ALBUTEROL SULFATE (2.5 MG/3ML) 0.083% IN NEBU
INHALATION_SOLUTION | RESPIRATORY_TRACT | Status: AC
  Administered 2018-05-12: 2.5 mg via RESPIRATORY_TRACT
  Filled 2018-05-12: qty 3

## 2018-05-12 MED FILL — predniSONE 20 MG TABS: 20 | 5 days supply | Qty: 11 | Fill #0
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
## 2018-05-12 NOTE — Discharge Instructions (Signed)
We saw you in the ER for your asthma related complains. We gave you some breathing treatments in the ER, and seems like your symptoms have improved. Please take albuterol as needed every 4 hours. Please take the medications prescribed. Please refrain from smoking or smoke exposure. Please see a primary care doctor in 1 week. Return to the ER if your symptoms worsen.  

## 2018-05-12 NOTE — ED Triage Notes (Signed)
Pt reports shortness of breath without chest pain, Hx asthma, personal neb  , no relief, unable to speak full sentences . Alert and oriented x 4.

## 2018-06-02 ENCOUNTER — Other Ambulatory Visit: Payer: Self-pay

## 2018-06-02 ENCOUNTER — Encounter (HOSPITAL_BASED_OUTPATIENT_CLINIC_OR_DEPARTMENT_OTHER): Payer: Self-pay | Admitting: Emergency Medicine

## 2018-06-02 ENCOUNTER — Emergency Department (HOSPITAL_BASED_OUTPATIENT_CLINIC_OR_DEPARTMENT_OTHER)
Admission: EM | Admit: 2018-06-02 | Discharge: 2018-06-02 | Disposition: A | Discharge status: Home / Self Care | Payer: Medicaid Other | Attending: Emergency Medicine | Admitting: Emergency Medicine

## 2018-06-02 DIAGNOSIS — Z7984 Long term (current) use of oral hypoglycemic drugs: Secondary | ICD-10-CM | POA: Diagnosis not present

## 2018-06-02 DIAGNOSIS — J4521 Mild intermittent asthma with (acute) exacerbation: Secondary | ICD-10-CM | POA: Diagnosis not present

## 2018-06-02 DIAGNOSIS — R0602 Shortness of breath: Secondary | ICD-10-CM | POA: Diagnosis present

## 2018-06-02 DIAGNOSIS — I10 Essential (primary) hypertension: Secondary | ICD-10-CM | POA: Diagnosis not present

## 2018-06-02 DIAGNOSIS — E119 Type 2 diabetes mellitus without complications: Secondary | ICD-10-CM | POA: Insufficient documentation

## 2018-06-02 DIAGNOSIS — Z79899 Other long term (current) drug therapy: Secondary | ICD-10-CM | POA: Insufficient documentation

## 2018-06-02 MED ORDER — PREDNISONE 50 MG PO TABS
60.0000 mg | ORAL_TABLET | Freq: Once | ORAL | Status: AC
  Administered 2018-06-02: 60 mg via ORAL
  Filled 2018-06-02: qty 1

## 2018-06-02 MED ORDER — PREDNISONE 10 MG (21) PO TBPK: ORAL_TABLET | ORAL | 0 refills | Status: DC

## 2018-06-02 MED ORDER — ALBUTEROL SULFATE (2.5 MG/3ML) 0.083% IN NEBU
2.5000 mg | INHALATION_SOLUTION | Freq: Once | RESPIRATORY_TRACT | Status: AC
  Administered 2018-06-02: 2.5 mg via RESPIRATORY_TRACT
  Filled 2018-06-02: qty 3

## 2018-06-02 MED ORDER — METHYLPREDNISOLONE SODIUM SUCC 125 MG IJ SOLR: 125.0000 mg | Freq: Once | INTRAMUSCULAR | Status: DC

## 2018-06-02 MED ORDER — BUDESONIDE-FORMOTEROL FUMARATE 160-4.5 MCG/ACT IN AERO: 2.0000 | INHALATION_SPRAY | Freq: Every day | RESPIRATORY_TRACT | 0 refills | Status: DC

## 2018-06-02 MED ORDER — IPRATROPIUM-ALBUTEROL 0.5-2.5 (3) MG/3ML IN SOLN
3.0000 mL | Freq: Once | RESPIRATORY_TRACT | Status: AC
  Administered 2018-06-02: 3 mL via RESPIRATORY_TRACT
  Filled 2018-06-02: qty 3

## 2018-06-02 NOTE — ED Notes (Signed)
Pt states he is feeling better.  Did note some fine expiratory wheezes.  Pt feels less sob.
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
## 2018-06-02 NOTE — ED Triage Notes (Signed)
Sob x 3 days.  Pt states it is worse today.  No fever.  Non productive cough.  Pt states he is out of his Symbicort and rescue inhaler..Marland Kitchen

## 2018-07-10 ENCOUNTER — Other Ambulatory Visit: Payer: Self-pay

## 2018-07-10 ENCOUNTER — Emergency Department (HOSPITAL_BASED_OUTPATIENT_CLINIC_OR_DEPARTMENT_OTHER)
Admission: EM | Admit: 2018-07-10 | Discharge: 2018-07-10 | Disposition: A | Discharge status: Home / Self Care | Payer: Medicaid Other | Attending: Emergency Medicine | Admitting: Emergency Medicine

## 2018-07-10 ENCOUNTER — Emergency Department (HOSPITAL_BASED_OUTPATIENT_CLINIC_OR_DEPARTMENT_OTHER): Payer: Medicaid Other

## 2018-07-10 ENCOUNTER — Encounter (HOSPITAL_BASED_OUTPATIENT_CLINIC_OR_DEPARTMENT_OTHER): Payer: Self-pay | Admitting: Emergency Medicine

## 2018-07-10 DIAGNOSIS — R0602 Shortness of breath: Secondary | ICD-10-CM | POA: Diagnosis present

## 2018-07-10 DIAGNOSIS — E119 Type 2 diabetes mellitus without complications: Secondary | ICD-10-CM | POA: Diagnosis not present

## 2018-07-10 DIAGNOSIS — I10 Essential (primary) hypertension: Secondary | ICD-10-CM | POA: Diagnosis not present

## 2018-07-10 DIAGNOSIS — J4531 Mild persistent asthma with (acute) exacerbation: Secondary | ICD-10-CM | POA: Diagnosis not present

## 2018-07-10 DIAGNOSIS — Z79899 Other long term (current) drug therapy: Secondary | ICD-10-CM | POA: Insufficient documentation

## 2018-07-10 IMAGING — CR DG CHEST 2V
2 series · 2 of 2 positions shown · non-contrast
Comparison: [DATE]

CLINICAL DATA: Shortness of breath, asthma attack, cough, wheezing

EXAM:
CHEST - 2 VIEW

[w chest pa]
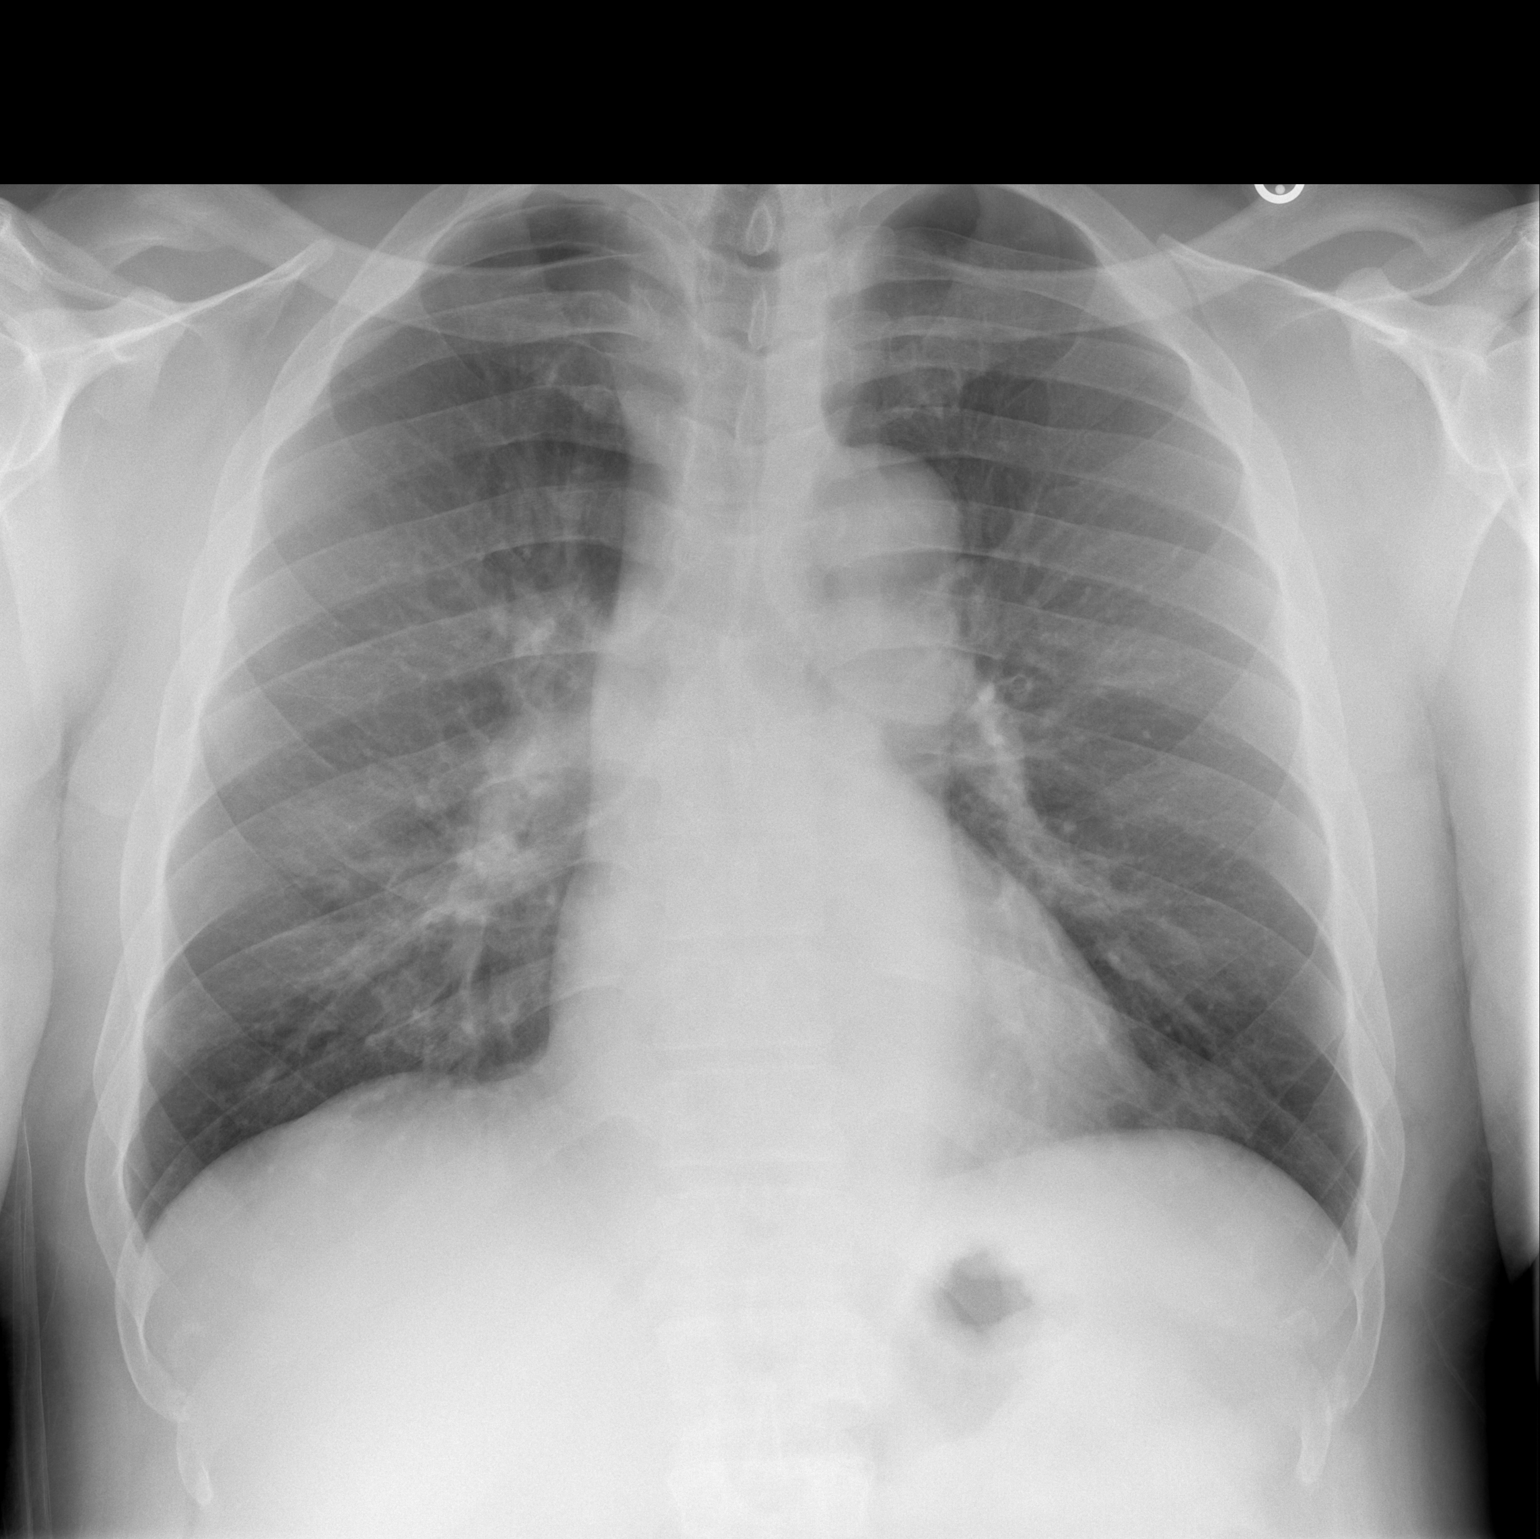

[w chest lat]
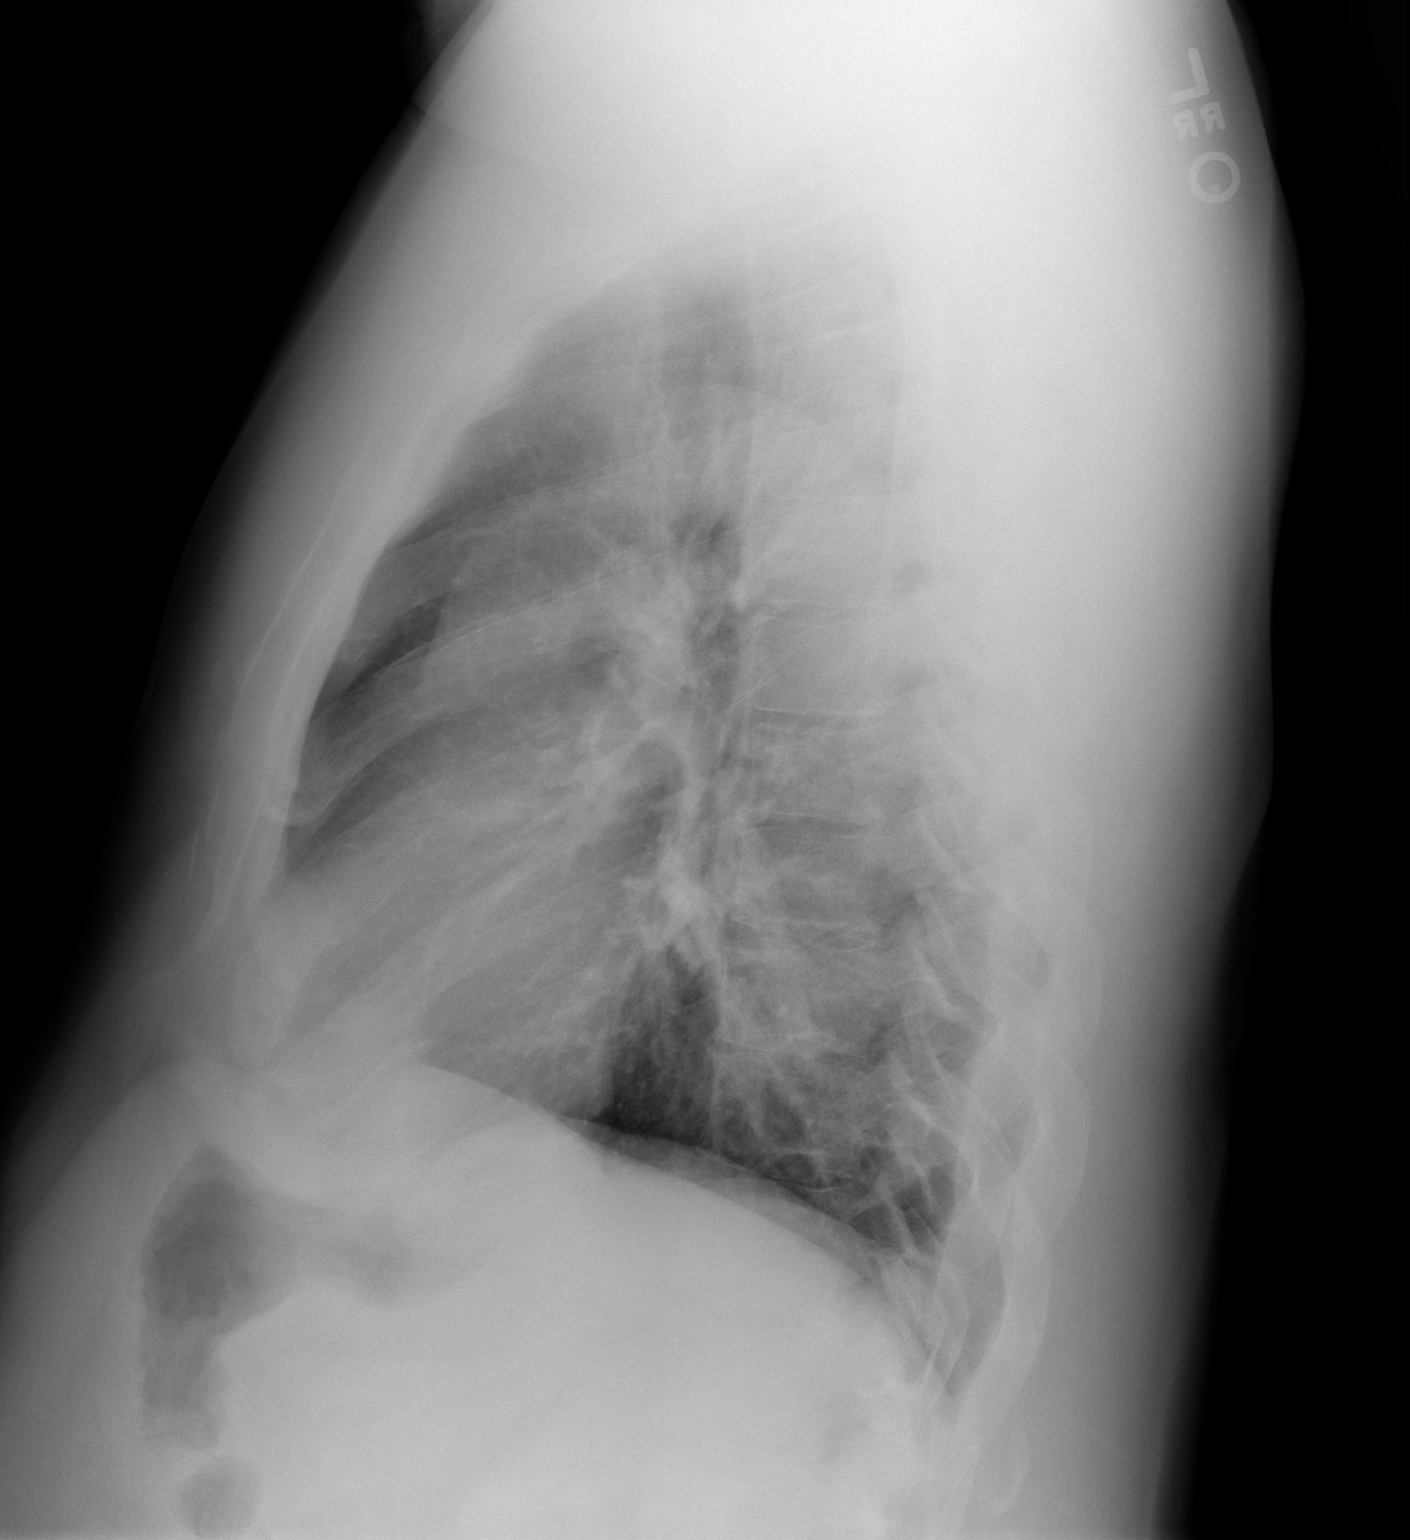

[2 of 2 positions shown; findings below may reference images not displayed]

FINDINGS: Lungs are clear.  No pleural effusion or pneumothorax.

The heart is normal in size.

Visualized osseous structures are within normal limits.
IMPRESSION: Normal chest radiographs.

## 2018-07-10 MED ORDER — PREDNISONE 10 MG PO TABS: 40.0000 mg | ORAL_TABLET | Freq: Every day | ORAL | 0 refills | Status: AC

## 2018-07-10 MED ORDER — FLUTICASONE PROPIONATE HFA 44 MCG/ACT IN AERO
2.0000 | INHALATION_SPRAY | Freq: Once | RESPIRATORY_TRACT | Status: AC
  Administered 2018-07-10: 2 via RESPIRATORY_TRACT

## 2018-07-10 MED ORDER — PREDNISONE 50 MG PO TABS
60.0000 mg | ORAL_TABLET | Freq: Once | ORAL | Status: AC
  Administered 2018-07-10: 60 mg via ORAL
  Filled 2018-07-10: qty 1

## 2018-07-10 MED ORDER — IPRATROPIUM-ALBUTEROL 0.5-2.5 (3) MG/3ML IN SOLN
RESPIRATORY_TRACT | Status: AC
  Administered 2018-07-10: 9 mL
  Filled 2018-07-10: qty 9

## 2018-07-10 MED FILL — predniSONE 10 MG TABS: 10 | 4 days supply | Qty: 16 | Fill #0
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
## 2018-07-10 NOTE — ED Triage Notes (Signed)
States has been SOB x 1 week and can't sleep. States has used his inhaler/nebs 10 times since 0300. Denies pain

## 2018-07-10 NOTE — ED Notes (Signed)
ED Provider at bedside. 

## 2018-08-25 ENCOUNTER — Emergency Department (HOSPITAL_BASED_OUTPATIENT_CLINIC_OR_DEPARTMENT_OTHER)
Admission: EM | Admit: 2018-08-25 | Discharge: 2018-08-25 | Disposition: A | Discharge status: Home / Self Care | Payer: Medicaid Other | Attending: Emergency Medicine | Admitting: Emergency Medicine

## 2018-08-25 ENCOUNTER — Other Ambulatory Visit: Payer: Self-pay

## 2018-08-25 ENCOUNTER — Encounter (HOSPITAL_BASED_OUTPATIENT_CLINIC_OR_DEPARTMENT_OTHER): Payer: Self-pay | Admitting: Emergency Medicine

## 2018-08-25 DIAGNOSIS — Z79899 Other long term (current) drug therapy: Secondary | ICD-10-CM | POA: Insufficient documentation

## 2018-08-25 DIAGNOSIS — R0602 Shortness of breath: Secondary | ICD-10-CM | POA: Diagnosis present

## 2018-08-25 DIAGNOSIS — Z7984 Long term (current) use of oral hypoglycemic drugs: Secondary | ICD-10-CM | POA: Diagnosis not present

## 2018-08-25 DIAGNOSIS — J45901 Unspecified asthma with (acute) exacerbation: Secondary | ICD-10-CM

## 2018-08-25 DIAGNOSIS — I10 Essential (primary) hypertension: Secondary | ICD-10-CM | POA: Insufficient documentation

## 2018-08-25 DIAGNOSIS — F329 Major depressive disorder, single episode, unspecified: Secondary | ICD-10-CM | POA: Insufficient documentation

## 2018-08-25 DIAGNOSIS — E119 Type 2 diabetes mellitus without complications: Secondary | ICD-10-CM | POA: Diagnosis not present

## 2018-08-25 MED ORDER — ALBUTEROL SULFATE (2.5 MG/3ML) 0.083% IN NEBU
2.5000 mg | INHALATION_SOLUTION | Freq: Once | RESPIRATORY_TRACT | Status: AC
  Administered 2018-08-25: 2.5 mg via RESPIRATORY_TRACT
  Filled 2018-08-25: qty 3

## 2018-08-25 MED ORDER — IPRATROPIUM-ALBUTEROL 0.5-2.5 (3) MG/3ML IN SOLN
3.0000 mL | Freq: Once | RESPIRATORY_TRACT | Status: AC
  Administered 2018-08-25: 3 mL via RESPIRATORY_TRACT
  Filled 2018-08-25: qty 3

## 2018-08-25 MED ORDER — PREDNISONE 50 MG PO TABS
60.0000 mg | ORAL_TABLET | Freq: Once | ORAL | Status: AC
  Administered 2018-08-25: 60 mg via ORAL
  Filled 2018-08-25: qty 1

## 2018-08-25 MED ORDER — BUDESONIDE-FORMOTEROL FUMARATE 160-4.5 MCG/ACT IN AERO: 2.0000 | INHALATION_SPRAY | Freq: Every day | RESPIRATORY_TRACT | 2 refills | Status: DC

## 2018-08-25 MED ORDER — PREDNISONE 10 MG PO TABS: 40.0000 mg | ORAL_TABLET | Freq: Every day | ORAL | 0 refills | Status: DC

## 2018-08-25 MED FILL — SYMBICORT 160-4.5 MCG INH: 160-4.5 | 30 days supply | Qty: 10 | Fill #0

## 2018-08-25 MED FILL — predniSONE 10 MG TABS: 10 | 5 days supply | Qty: 20 | Fill #0

## 2018-08-25 NOTE — ED Triage Notes (Addendum)
Pt having sob x 2 days.  Pt relates he has been taking his albuterol but has been out of symbicort x 2 weeks.
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
## 2018-08-25 NOTE — Discharge Instructions (Signed)
Take the prednisone as directed for the next 5 days.  Continue to use your nebulizer or albuterol inhaler 2 puffs every 6 hours for the next 7 days and then as your usual regimen.  Make an appointment follow-up with your doctor.  Return for any new or worse symptoms.

## 2019-03-07 ENCOUNTER — Emergency Department (HOSPITAL_BASED_OUTPATIENT_CLINIC_OR_DEPARTMENT_OTHER)
Admission: EM | Admit: 2019-03-07 | Discharge: 2019-03-07 | Disposition: A | Discharge status: Home / Self Care | Payer: Medicaid Other | Attending: Emergency Medicine | Admitting: Emergency Medicine

## 2019-03-07 ENCOUNTER — Other Ambulatory Visit: Payer: Self-pay

## 2019-03-07 ENCOUNTER — Emergency Department (HOSPITAL_BASED_OUTPATIENT_CLINIC_OR_DEPARTMENT_OTHER): Payer: Medicaid Other

## 2019-03-07 ENCOUNTER — Encounter (HOSPITAL_BASED_OUTPATIENT_CLINIC_OR_DEPARTMENT_OTHER): Payer: Self-pay | Admitting: Emergency Medicine

## 2019-03-07 DIAGNOSIS — K859 Acute pancreatitis without necrosis or infection, unspecified: Secondary | ICD-10-CM | POA: Diagnosis not present

## 2019-03-07 DIAGNOSIS — E119 Type 2 diabetes mellitus without complications: Secondary | ICD-10-CM | POA: Insufficient documentation

## 2019-03-07 DIAGNOSIS — R1010 Upper abdominal pain, unspecified: Secondary | ICD-10-CM | POA: Diagnosis present

## 2019-03-07 DIAGNOSIS — I10 Essential (primary) hypertension: Secondary | ICD-10-CM | POA: Insufficient documentation

## 2019-03-07 DIAGNOSIS — J45909 Unspecified asthma, uncomplicated: Secondary | ICD-10-CM | POA: Diagnosis not present

## 2019-03-07 DIAGNOSIS — Z79899 Other long term (current) drug therapy: Secondary | ICD-10-CM | POA: Diagnosis not present

## 2019-03-07 DIAGNOSIS — Z7984 Long term (current) use of oral hypoglycemic drugs: Secondary | ICD-10-CM | POA: Diagnosis not present

## 2019-03-07 HISTORY — DX: Other chronic pain: G89.29

## 2019-03-07 HISTORY — DX: Unspecified osteoarthritis, unspecified site: M19.90

## 2019-03-07 LAB — COMPREHENSIVE METABOLIC PANEL
ALT: 28 U/L (ref 0–44)
AST: 44 U/L — ABNORMAL HIGH (ref 15–41)
Albumin: 3.6 g/dL (ref 3.5–5.0)
Alkaline Phosphatase: 101 U/L (ref 38–126)
Anion gap: 12 (ref 5–15)
BUN: 7 mg/dL (ref 6–20)
CO2: 25 mmol/L (ref 22–32)
Calcium: 8.3 mg/dL — ABNORMAL LOW (ref 8.9–10.3)
Chloride: 99 mmol/L (ref 98–111)
Creatinine, Ser: 0.88 mg/dL (ref 0.61–1.24)
GFR calc Af Amer: >60 mL/min (ref >60)
GFR calc non Af Amer: >60 mL/min (ref >60)
Glucose, Bld: 118 mg/dL — ABNORMAL HIGH (ref 70–99)
Potassium: 3.5 mmol/L (ref 3.5–5.1)
Sodium: 136 mmol/L (ref 135–145)
Total Bilirubin: 1.3 mg/dL — ABNORMAL HIGH (ref 0.3–1.2)
Total Protein: 7.2 g/dL (ref 6.5–8.1)

## 2019-03-07 LAB — CBC WITH DIFFERENTIAL/PLATELET
Abs Immature Granulocytes: 0.03 10*3/uL (ref 0.00–0.07)
Basophils Absolute: 0.1 10*3/uL (ref 0.0–0.1)
Basophils Relative: 1 %
Eosinophils Absolute: 0.5 10*3/uL (ref 0.0–0.5)
Eosinophils Relative: 5 %
HCT: 46.8 % (ref 39.0–52.0)
Hemoglobin: 14.5 g/dL (ref 13.0–17.0)
Immature Granulocytes: 0 %
Lymphocytes Relative: 23 %
Lymphs Abs: 2.1 10*3/uL (ref 0.7–4.0)
MCH: 26.6 pg (ref 26.0–34.0)
MCHC: 31 g/dL (ref 30.0–36.0)
MCV: 85.7 fL (ref 80.0–100.0)
Monocytes Absolute: 0.7 10*3/uL (ref 0.1–1.0)
Monocytes Relative: 8 %
Neutro Abs: 5.8 10*3/uL (ref 1.7–7.7)
Neutrophils Relative %: 63 %
Platelets: 152 10*3/uL (ref 150–400)
RBC: 5.46 MIL/uL (ref 4.22–5.81)
RDW: 14.8 % (ref 11.5–15.5)
WBC: 9.2 10*3/uL (ref 4.0–10.5)
nRBC: 0 % (ref 0.0–0.2)

## 2019-03-07 LAB — LIPASE, BLOOD: Lipase: 54 U/L — ABNORMAL HIGH (ref 11–51)
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
## 2019-03-07 MED ORDER — ONDANSETRON 4 MG PO TBDP: 4.0000 mg | ORAL_TABLET | Freq: Three times a day (TID) | ORAL | 0 refills | Status: DC | PRN

## 2019-03-07 MED ORDER — IOHEXOL 300 MG/ML  SOLN
100.0000 mL | Freq: Once | INTRAMUSCULAR | Status: AC | PRN
  Administered 2019-03-07: 100 mL via INTRAVENOUS

## 2019-03-07 MED ORDER — SODIUM CHLORIDE 0.9 % IV BOLUS
500.0000 mL | Freq: Once | INTRAVENOUS | Status: AC
  Administered 2019-03-07: 500 mL via INTRAVENOUS

## 2019-03-07 MED ORDER — OXYCODONE-ACETAMINOPHEN 5-325 MG PO TABS
1.0000 | ORAL_TABLET | Freq: Once | ORAL | Status: AC
  Administered 2019-03-07: 1 via ORAL
  Filled 2019-03-07: qty 1

## 2019-03-07 MED ORDER — FENTANYL CITRATE (PF) 100 MCG/2ML IJ SOLN
50.0000 ug | Freq: Once | INTRAMUSCULAR | Status: AC
  Administered 2019-03-07: 50 ug via INTRAVENOUS
  Filled 2019-03-07: qty 2

## 2019-03-07 MED ORDER — ONDANSETRON HCL 4 MG/2ML IJ SOLN
4.0000 mg | Freq: Once | INTRAMUSCULAR | Status: AC
  Administered 2019-03-07: 4 mg via INTRAVENOUS
  Filled 2019-03-07: qty 2

## 2019-03-07 NOTE — ED Notes (Signed)
Pt declined blanket at this time

## 2019-03-07 NOTE — ED Triage Notes (Signed)
Mid abd pain with nausea x 2 days.
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
## 2019-03-07 NOTE — ED Notes (Signed)
Patient ambulated to CT

## 2019-03-07 NOTE — Discharge Instructions (Signed)
Take the 10 mg oxycodone tablets you have at home for the pain.  Follow-up with your doctor.  Take the nausea medicine as needed for the nausea.

## 2019-03-07 NOTE — ED Notes (Signed)
ED Provider at bedside. 
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
## 2019-05-21 ENCOUNTER — Emergency Department (HOSPITAL_BASED_OUTPATIENT_CLINIC_OR_DEPARTMENT_OTHER)
Admission: EM | Admit: 2019-05-21 | Discharge: 2019-05-21 | Disposition: A | Discharge status: Home / Self Care | Payer: Medicaid Other | Attending: Emergency Medicine | Admitting: Emergency Medicine

## 2019-05-21 ENCOUNTER — Encounter (HOSPITAL_BASED_OUTPATIENT_CLINIC_OR_DEPARTMENT_OTHER): Payer: Self-pay | Admitting: Emergency Medicine

## 2019-05-21 ENCOUNTER — Other Ambulatory Visit: Payer: Self-pay

## 2019-05-21 ENCOUNTER — Emergency Department (HOSPITAL_BASED_OUTPATIENT_CLINIC_OR_DEPARTMENT_OTHER): Payer: Medicaid Other

## 2019-05-21 DIAGNOSIS — J4541 Moderate persistent asthma with (acute) exacerbation: Secondary | ICD-10-CM | POA: Diagnosis not present

## 2019-05-21 DIAGNOSIS — I10 Essential (primary) hypertension: Secondary | ICD-10-CM | POA: Diagnosis not present

## 2019-05-21 DIAGNOSIS — Z91013 Allergy to seafood: Secondary | ICD-10-CM | POA: Insufficient documentation

## 2019-05-21 DIAGNOSIS — Z7984 Long term (current) use of oral hypoglycemic drugs: Secondary | ICD-10-CM | POA: Diagnosis not present

## 2019-05-21 DIAGNOSIS — Z79899 Other long term (current) drug therapy: Secondary | ICD-10-CM | POA: Diagnosis not present

## 2019-05-21 DIAGNOSIS — E782 Mixed hyperlipidemia: Secondary | ICD-10-CM | POA: Insufficient documentation

## 2019-05-21 DIAGNOSIS — E119 Type 2 diabetes mellitus without complications: Secondary | ICD-10-CM | POA: Insufficient documentation

## 2019-05-21 DIAGNOSIS — R0602 Shortness of breath: Secondary | ICD-10-CM | POA: Diagnosis present

## 2019-05-21 IMAGING — DX DG CHEST 1V PORT
1 series · 1 of 1 positions shown · non-contrast
Comparison: [DATE].  [DATE].  [DATE].  [DATE].

CLINICAL DATA: Wheezing.  History of asthma.

EXAM:
PORTABLE CHEST 1 VIEW

[chest ap]
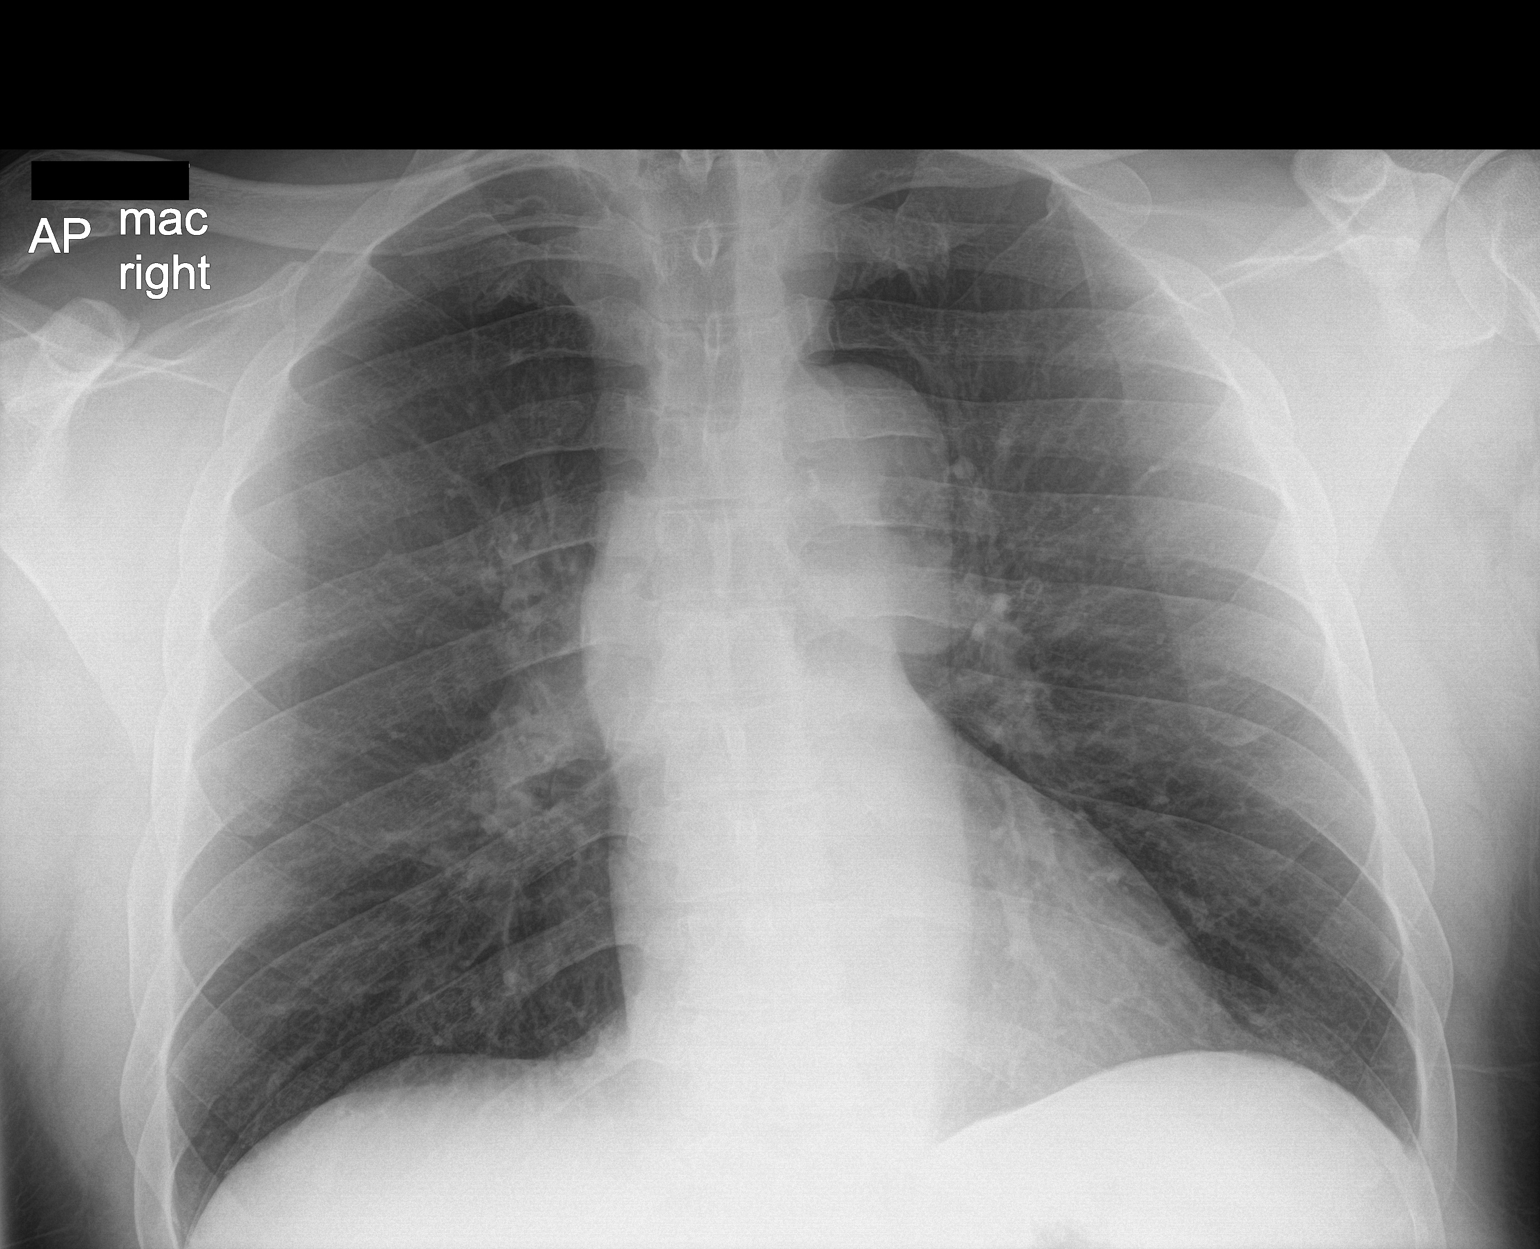

[1 of 1 positions shown; findings below may reference images not displayed]

FINDINGS: Mediastinum and hilar structures normal. Tortuosity of the thoracic
aorta is again noted. No interim change given portable apical
lordotic technique. Heart size normal. Lungs are clear. No pleural
effusion or pneumothorax. No acute bony abnormality.
IMPRESSION: No acute abnormality identified.

## 2019-05-21 MED ORDER — MAGNESIUM SULFATE 2 GM/50ML IV SOLN
2.0000 g | Freq: Once | INTRAVENOUS | Status: AC
  Administered 2019-05-21: 2 g via INTRAVENOUS
  Filled 2019-05-21: qty 50

## 2019-05-21 MED ORDER — IPRATROPIUM BROMIDE HFA 17 MCG/ACT IN AERS
4.0000 | INHALATION_SPRAY | Freq: Once | RESPIRATORY_TRACT | Status: AC
  Administered 2019-05-21: 4 via RESPIRATORY_TRACT
  Filled 2019-05-21: qty 12.9

## 2019-05-21 MED ORDER — METHYLPREDNISOLONE SODIUM SUCC 125 MG IJ SOLR
125.0000 mg | Freq: Once | INTRAMUSCULAR | Status: AC
  Administered 2019-05-21: 125 mg via INTRAVENOUS
  Filled 2019-05-21: qty 2

## 2019-05-21 MED ORDER — PREDNISONE 20 MG PO TABS: ORAL_TABLET | ORAL | 0 refills | Status: DC

## 2019-05-21 MED ORDER — ALBUTEROL SULFATE HFA 108 (90 BASE) MCG/ACT IN AERS
10.0000 | INHALATION_SPRAY | Freq: Once | RESPIRATORY_TRACT | Status: AC
  Administered 2019-05-21: 10 via RESPIRATORY_TRACT
  Filled 2019-05-21: qty 6.7

## 2019-05-21 NOTE — ED Triage Notes (Signed)
Patient presents with complaints of asthma attack; states using nebulizer this evening with no relief; states onset 1-2 weeks; states difficulty sleeping this am. Ambulatory with steady gait.
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
## 2019-06-16 ENCOUNTER — Emergency Department (HOSPITAL_BASED_OUTPATIENT_CLINIC_OR_DEPARTMENT_OTHER)
Admission: EM | Admit: 2019-06-16 | Discharge: 2019-06-16 | Disposition: A | Discharge status: Home / Self Care | Payer: Medicaid Other | Attending: Emergency Medicine | Admitting: Emergency Medicine

## 2019-06-16 ENCOUNTER — Other Ambulatory Visit: Payer: Self-pay

## 2019-06-16 ENCOUNTER — Encounter (HOSPITAL_BASED_OUTPATIENT_CLINIC_OR_DEPARTMENT_OTHER): Payer: Self-pay | Admitting: Emergency Medicine

## 2019-06-16 DIAGNOSIS — Z79899 Other long term (current) drug therapy: Secondary | ICD-10-CM | POA: Diagnosis not present

## 2019-06-16 DIAGNOSIS — F410 Panic disorder [episodic paroxysmal anxiety] without agoraphobia: Secondary | ICD-10-CM | POA: Insufficient documentation

## 2019-06-16 DIAGNOSIS — I9589 Other hypotension: Secondary | ICD-10-CM | POA: Insufficient documentation

## 2019-06-16 DIAGNOSIS — H1045 Other chronic allergic conjunctivitis: Secondary | ICD-10-CM | POA: Insufficient documentation

## 2019-06-16 DIAGNOSIS — Z91013 Allergy to seafood: Secondary | ICD-10-CM | POA: Insufficient documentation

## 2019-06-16 DIAGNOSIS — I10 Essential (primary) hypertension: Secondary | ICD-10-CM | POA: Diagnosis not present

## 2019-06-16 DIAGNOSIS — E119 Type 2 diabetes mellitus without complications: Secondary | ICD-10-CM | POA: Insufficient documentation

## 2019-06-16 DIAGNOSIS — E782 Mixed hyperlipidemia: Secondary | ICD-10-CM | POA: Insufficient documentation

## 2019-06-16 DIAGNOSIS — Z7984 Long term (current) use of oral hypoglycemic drugs: Secondary | ICD-10-CM | POA: Diagnosis not present

## 2019-06-16 DIAGNOSIS — J45909 Unspecified asthma, uncomplicated: Secondary | ICD-10-CM | POA: Insufficient documentation

## 2019-06-16 DIAGNOSIS — H1013 Acute atopic conjunctivitis, bilateral: Secondary | ICD-10-CM

## 2019-06-16 DIAGNOSIS — F419 Anxiety disorder, unspecified: Secondary | ICD-10-CM | POA: Diagnosis present

## 2019-06-16 LAB — CBC WITH DIFFERENTIAL/PLATELET
Abs Immature Granulocytes: 0.02 10*3/uL (ref 0.00–0.07)
Basophils Absolute: 0 10*3/uL (ref 0.0–0.1)
Basophils Relative: 1 %
Eosinophils Absolute: 0.1 10*3/uL (ref 0.0–0.5)
Eosinophils Relative: 1 %
HCT: 45.6 % (ref 39.0–52.0)
Hemoglobin: 14.5 g/dL (ref 13.0–17.0)
Immature Granulocytes: 0 %
Lymphocytes Relative: 23 %
Lymphs Abs: 1.7 10*3/uL (ref 0.7–4.0)
MCH: 27.1 pg (ref 26.0–34.0)
MCHC: 31.8 g/dL (ref 30.0–36.0)
MCV: 85.2 fL (ref 80.0–100.0)
Monocytes Absolute: 0.4 10*3/uL (ref 0.1–1.0)
Monocytes Relative: 6 %
Neutro Abs: 5.2 10*3/uL (ref 1.7–7.7)
Neutrophils Relative %: 69 %
Platelets: 218 10*3/uL (ref 150–400)
RBC: 5.35 MIL/uL (ref 4.22–5.81)
RDW: 14.7 % (ref 11.5–15.5)
WBC: 7.5 10*3/uL (ref 4.0–10.5)
nRBC: 0 % (ref 0.0–0.2)

## 2019-06-16 LAB — COMPREHENSIVE METABOLIC PANEL
ALT: 43 U/L (ref 0–44)
AST: 65 U/L — ABNORMAL HIGH (ref 15–41)
Albumin: 3.6 g/dL (ref 3.5–5.0)
Alkaline Phosphatase: 107 U/L (ref 38–126)
Anion gap: 13 (ref 5–15)
BUN: 14 mg/dL (ref 6–20)
CO2: 21 mmol/L — ABNORMAL LOW (ref 22–32)
Calcium: 8.6 mg/dL — ABNORMAL LOW (ref 8.9–10.3)
Chloride: 102 mmol/L (ref 98–111)
Creatinine, Ser: 1.46 mg/dL — ABNORMAL HIGH (ref 0.61–1.24)
GFR calc Af Amer: >60 mL/min (ref >60)
GFR calc non Af Amer: 55 mL/min — ABNORMAL LOW (ref >60)
Glucose, Bld: 143 mg/dL — ABNORMAL HIGH (ref 70–99)
Potassium: 3.7 mmol/L (ref 3.5–5.1)
Sodium: 136 mmol/L (ref 135–145)
Total Bilirubin: 0.8 mg/dL (ref 0.3–1.2)
Total Protein: 7 g/dL (ref 6.5–8.1)

## 2019-06-16 MED ORDER — TETRACAINE HCL 0.5 % OP SOLN
2.0000 [drp] | Freq: Once | OPHTHALMIC | Status: DC
  Filled 2019-06-16: qty 4

## 2019-06-16 MED ORDER — FLUORESCEIN SODIUM 1 MG OP STRP
2.0000 | ORAL_STRIP | Freq: Once | OPHTHALMIC | Status: DC
  Filled 2019-06-16: qty 2

## 2019-06-16 MED ORDER — ACETAMINOPHEN 500 MG PO TABS
1000.0000 mg | ORAL_TABLET | Freq: Once | ORAL | Status: AC
  Administered 2019-06-16: 03:00:00 1000 mg via ORAL
  Filled 2019-06-16: qty 2

## 2019-06-16 MED ORDER — HYDROXYZINE HCL 25 MG PO TABS: 25.0000 mg | ORAL_TABLET | Freq: Three times a day (TID) | ORAL | 0 refills | Status: DC | PRN

## 2019-06-16 MED ORDER — SODIUM CHLORIDE 0.9 % IV BOLUS (SEPSIS)
1000.0000 mL | Freq: Once | INTRAVENOUS | Status: AC
  Administered 2019-06-16: 1000 mL via INTRAVENOUS

## 2019-06-16 NOTE — ED Provider Notes (Signed)
CHIEF COMPLAINT: Panic attack, blurry vision and eye irritation  HPI: Patient is a 53 year old male with history of hypertension, diabetes, hyperlipidemia, chronic pain on oxycodone, panic attacks who presents to the emergency department with complaints of a panic attack.  States it started about 30 minutes prior to arrival.  Started after he woke up and states he felt very panicked and had to run outside in his boxers "just so I could feel life again".  He states that this feels like his previous panic attacks.  He took clonazepam prior to arrival and reports feeling better.  No chest pain or shortness of breath.  No SI, HI or hallucinations.  Wife drove him here to the emergency department.  Patient also complaining of irritation to both of his eyes for the past 2 weeks.  States he is on pazeo eye drops and TobraDex cream.  States that he last saw his eye doctor 4 weeks ago, Dr. Frederico Hamman in [place].  States he is supposed to use these medications when his eyes become irritated because of allergies.  States that he lost his TobraDex cream for the past 10 days but then used it again tonight and it caused burning in both of his eyes but worse on the right side.  Last with this medication in his eye at 1:30 AM.  States his vision seems blurry now in the right eye.  No vision loss.  No other injury to the eye.  Requesting something for discomfort and requesting that his eye be irrigated.  States he is supposed to wear glasses but does not.  Does not wear contacts.  Has had previous eye surgeries.  States he had surgery 1 year ago and 4 years ago due to "loose eyes".  ROS: See HPI, limited as patient is an extremely poor historian Constitutional: no fever  Eyes: no drainage  ENT: no runny nose   Cardiovascular:  no chest pain  Resp: no SOB  GI: no vomiting GU: no dysuria Integumentary: no rash  Allergy: no hives  Musculoskeletal: no leg swelling  Neurological: no slurred speech ROS otherwise  negative  PAST MEDICAL HISTORY/PAST SURGICAL HISTORY:  Past Medical History:  Diagnosis Date  . Arthritis   . Asthma   . Chronic pain    Pain management  . Depression   . Diabetes mellitus without complication ([employer])    pt states borderline  . Gout   . High cholesterol   . History of stomach ulcers   . Hypertension     MEDICATIONS:  Prior to Admission medications   Medication Sig Start Date End Date Taking? Authorizing Provider  albuterol (PROVENTIL HFA;VENTOLIN HFA) 108 (90 Base) MCG/ACT inhaler Inhale 2 puffs into the lungs every 6 (six) hours as needed for wheezing or shortness of breath. 03/29/17   Glyn Ade, PA-C  albuterol (PROVENTIL) (2.5 MG/3ML) 0.083% nebulizer solution Take 2.5 mg by nebulization every 2 (two) hours as needed. For wheezing      [provider]  amLODipine (NORVASC) 10 MG tablet TK 1 T PO QD 10/21/17   [provider]  ATORVASTATIN CALCIUM PO Take by mouth.    [provider]  budesonide-formoterol (SYMBICORT) 160-4.5 MCG/ACT inhaler Inhale 2 puffs into the lungs daily. 06/02/18   Isla Pence, MD  budesonide-formoterol (SYMBICORT) 160-4.5 MCG/ACT inhaler Inhale 2 puffs into the lungs daily. 08/25/18   Fredia Sorrow, MD  Cholecalciferol (VITAMIN D3) 25 MCG (1000 UT) CAPS TK 1 C PO D 04/23/18   [provider]  clobetasol cream (TEMOVATE) 0.05 % APP EXT AA BID FOR 14 DAYS 05/12/18   [provider]  lisinopril (PRINIVIL,ZESTRIL) 40 MG tablet TK 1 T PO QD 10/21/17   [provider]  metFORMIN (GLUCOPHAGE) 500 MG tablet TK 1/2 T PO BID 12/23/17   [provider]  montelukast (SINGULAIR) 10 MG tablet TK 1 T PO  QPM 06/07/15   [provider]  omeprazole (PRILOSEC) 20 MG capsule Take 1 capsule (20 mg total) by mouth daily. 05/07/16   Arby Barrette, MD  ondansetron (ZOFRAN-ODT) 4 MG disintegrating tablet Take 1 tablet (4 mg total) by mouth every 8 (eight) hours as needed for nausea  or vomiting. 03/07/19   Benjiman Core, MD  Oxycodone HCl 10 MG TABS TK 1 T PO QID 05/05/18   [provider]  predniSONE (DELTASONE) 10 MG tablet Take 4 tablets (40 mg total) by mouth daily. 08/25/18   Vanetta Mulders, MD  predniSONE (DELTASONE) 20 MG tablet 3 tabs po day one, then 2 po daily x 4 days 05/21/19   Nicanor Alcon, April, MD    ALLERGIES:  Allergies  Allergen Reactions  . Shellfish Allergy Anaphylaxis    SOCIAL HISTORY:  Social History   Tobacco Use  . Smoking status: Never Smoker  . Smokeless tobacco: Never Used  Substance Use Topics  . Alcohol use: Yes    Alcohol/week: 1.0 standard drinks    Types: 1 Cans of beer per week    Comment: occ    FAMILY HISTORY: History reviewed. No pertinent family history.  EXAM: BP 96/65 (BP Location: Right Arm)   Pulse 78   Temp 97.8 F (36.6 C) (Oral)   Resp 14   Ht 6' 3.5" (1.918 m)   Wt 120.7 kg   SpO2 95%   BMI 32.81 kg/m  CONSTITUTIONAL: Alert and oriented and responds appropriately to questions. Well-appearing; well-nourished, patient appears relaxed and comfortable and drowsy HEAD: Normocephalic EYES: Conjunctivae clear, pupils appear equal, EOM appear intact, mildly injected sclera bilaterally, no hypopyon or hyphema, unable to assess optic disc in the ED at this time, no visual field deficit, no drainage, pressure in the right eye is 14 mmHg, pressure in the left eye is 21 mmHg, no fluorescein uptake, no foreign body appreciated ENT: normal nose; moist mucous membranes NECK: Supple, normal ROM CARD: RRR; S1 and S2 appreciated; no murmurs, no clicks, no rubs, no gallops RESP: Normal chest excursion without splinting or tachypnea; breath sounds clear and equal bilaterally; no wheezes, no rhonchi, no rales, no hypoxia or respiratory distress, speaking full sentences ABD/GI: Normal bowel sounds; non-distended; soft, non-tender, no rebound, no guarding, no peritoneal signs, no hepatosplenomegaly BACK:  The back  appears normal EXT: Normal ROM in all joints; no deformity noted, no edema; no cyanosis SKIN: Normal color for age and race; warm; no rash on exposed skin NEURO: Moves all extremities equally PSYCH: The patient's mood and manner are appropriate.   MEDICAL DECISION MAKING: Patient with what sounds like history of allergic conjunctivitis on olopatadine and TobraDex.  States that after TobraDex was put into his right eye tonight it began to burn and he felt his vision was blurry.  He states he does not think that it was any other medication or cream.  No other injury to the eye.  His intraocular pressures are normal.  There is no fluorescein uptake or foreign body.  Requesting that his eye be irrigated today.  Will give Tylenol for discomfort.  Recommend follow-up with  his ophthalmologist as an outpatient.  As for his panic attack, patient reports that this is chronic but felt worse than normal but now he is calm, comfortable after taking clonazepam.  No associated chest pain or shortness of breath.  No SI, HI or hallucinations.  I do not feel he needs emergent psychiatric evaluation.  We will continue to monitor.  ED PROGRESS: Patient noted to be hypotensive.  Could be from clonazepam use at home.  He states he is on amlodipine and lisinopril without any change and took his medications yesterday.  No fevers, cough, chest pain, shortness of breath, vomiting, diarrhea, bloody stools, melena.  Will hydrate patient, obtain labs and EKG.   EKG shows no new ischemic change or arrhythmia.  Labs today unremarkable other than mild elevation of his creatinine but is receiving IV fluids.  Blood pressure slowly improving with hydration.  Suspect hypotension secondary from mild hypovolemia versus sedation from his clonazepam and oxycodone.  He has not had any respiratory depression, apnea here.  His wife is driving him home.  Have advised him to follow-up closely with his primary care physician before resuming his  blood pressure medications.  He denies feeling lightheaded here.  4:05 AM  Pt's blood pressure currently 105/71.  Heart rate in the 80s.  He is now more awake and has no complaints.  Have advised him to follow-up with his ophthalmologist as well as his PCP.  Have advised him to hold his blood pressure medication for the next 24 hours.  Discussed return precautions.  He is comfortable with this plan.  At this time, I do not feel there is any life-threatening condition present. I have reviewed, interpreted and discussed all results (EKG, imaging, lab, urine as appropriate) and exam findings with patient/family. I have reviewed nursing notes and appropriate previous records.  I feel the patient is safe to be discharged home without further emergent workup and can continue workup as an outpatient as needed. Discussed usual and customary return precautions. Patient/family verbalize understanding and are comfortable with this plan.  Outpatient follow-up has been provided as needed. All questions have been answered.    EKG Interpretation  Date/Time:  Tuesday June 16 2019 03:08:42 EST Ventricular Rate:  80 PR Interval:    QRS Duration: 98 QT Interval:  414 QTC Calculation: 478 R Axis:   -15 Text Interpretation: Sinus rhythm Probable left atrial enlargement Abnormal R-wave progression, late transition Left ventricular hypertrophy Borderline ST elevation, lateral leads Borderline prolonged QT interval Baseline wander in lead(s) V5 No significant change since last tracing Confirmed by Rochele Raring,  [id number]) on 06/16/2019 3:10:25 AM        CRITICAL CARE Performed by: Baxter Hire    Total critical care time: 45 minutes  Critical care time was exclusive of separately billable procedures and treating other patients.  Critical care was necessary to treat or prevent imminent or life-threatening deterioration.  Critical care was time spent personally by me on the following activities: development of  treatment plan with patient and/or surrogate as well as nursing, discussions with consultants, evaluation of patient's response to treatment, examination of patient, obtaining history from patient or surrogate, ordering and performing treatments and interventions, ordering and review of laboratory studies, ordering and review of radiographic studies, pulse oximetry and re-evaluation of patient's condition.   Bertrum SolSimon Lopezperez was evaluated in Emergency Department on 06/16/2019 for the symptoms described in the history of present illness. He was evaluated in the context of the global COVID-19 pandemic, which necessitated consideration  that the patient might be at risk for infection with the SARS-CoV-2 virus that causes COVID-19. Institutional protocols and algorithms that pertain to the evaluation of patients at risk for COVID-19 are in a state of rapid change based on information released by regulatory bodies including the CDC and federal and state organizations. These policies and algorithms were followed during the patient's care in the ED.  Patient was seen wearing N95, face shield, gloves.    , Layla Maw, DO 06/16/19 [phone]

## 2019-06-16 NOTE — ED Notes (Signed)
ED Provider at bedside. 

## 2019-06-16 NOTE — ED Notes (Signed)
Pt bilat eyes flushed with sterile water- pt tolerated well. Pt resting at this time and will reeval in a few

## 2019-06-16 NOTE — ED Notes (Signed)
Pt noted to be sleeping upon this RN entering room- pt had to be woken up to perform visual acuity.

## 2019-06-16 NOTE — Discharge Instructions (Addendum)
Please continue your eye medications as prescribed and follow-up with your ophthalmologist if symptoms or not improving.  Your blood pressure was also very low today.  I recommend holding your blood pressure medications for the next 24 hours and close follow-up with your primary care physician.  We have provided you with a prescription of Atarax to take as needed for further panic attacks.

## 2019-06-16 NOTE — ED Notes (Signed)
Continued to irrigate pt bilat eyes- pt reports eyes feeling better. Pt also requesting something for anxiety- pt appears to be in NAD.

## 2019-06-16 NOTE — ED Notes (Signed)
Pt states "I am supposed to have glasses but I dont have a prescription"

## 2019-06-16 NOTE — ED Triage Notes (Signed)
Pt states he woke up tonight having a bad panic attack  Pt also states his right eye has been blurry for the past 3 days  Pt states he was seen by his PCP on Monday and was given a shot for his asthma

## 2019-06-21 ENCOUNTER — Emergency Department (HOSPITAL_BASED_OUTPATIENT_CLINIC_OR_DEPARTMENT_OTHER): Payer: Medicaid Other

## 2019-06-21 ENCOUNTER — Emergency Department (HOSPITAL_BASED_OUTPATIENT_CLINIC_OR_DEPARTMENT_OTHER)
Admission: EM | Admit: 2019-06-21 | Discharge: 2019-06-21 | Disposition: A | Discharge status: Home / Self Care | Payer: Medicaid Other | Attending: Emergency Medicine | Admitting: Emergency Medicine

## 2019-06-21 ENCOUNTER — Encounter (HOSPITAL_BASED_OUTPATIENT_CLINIC_OR_DEPARTMENT_OTHER): Payer: Self-pay | Admitting: Emergency Medicine

## 2019-06-21 ENCOUNTER — Other Ambulatory Visit: Payer: Self-pay

## 2019-06-21 DIAGNOSIS — F419 Anxiety disorder, unspecified: Secondary | ICD-10-CM | POA: Insufficient documentation

## 2019-06-21 DIAGNOSIS — R0789 Other chest pain: Secondary | ICD-10-CM

## 2019-06-21 DIAGNOSIS — G44209 Tension-type headache, unspecified, not intractable: Secondary | ICD-10-CM

## 2019-06-21 DIAGNOSIS — U071 COVID-19: Secondary | ICD-10-CM | POA: Diagnosis not present

## 2019-06-21 LAB — BASIC METABOLIC PANEL
Anion gap: 12 (ref 5–15)
BUN: 13 mg/dL (ref 6–20)
CO2: 25 mmol/L (ref 22–32)
Calcium: 8.7 mg/dL — ABNORMAL LOW (ref 8.9–10.3)
Chloride: 100 mmol/L (ref 98–111)
Creatinine, Ser: 0.97 mg/dL (ref 0.61–1.24)
GFR calc Af Amer: >60 mL/min (ref >60)
GFR calc non Af Amer: >60 mL/min (ref >60)
Glucose, Bld: 105 mg/dL — ABNORMAL HIGH (ref 70–99)
Potassium: 3.7 mmol/L (ref 3.5–5.1)
Sodium: 137 mmol/L (ref 135–145)

## 2019-06-21 LAB — TROPONIN I (HIGH SENSITIVITY)
Troponin I (High Sensitivity): 14 ng/L (ref <18)
Troponin I (High Sensitivity): 14 ng/L (ref <18)

## 2019-06-21 LAB — CBC
HCT: 49.9 % (ref 39.0–52.0)
Hemoglobin: 15.8 g/dL (ref 13.0–17.0)
MCH: 26.4 pg (ref 26.0–34.0)
MCHC: 31.7 g/dL (ref 30.0–36.0)
MCV: 83.3 fL (ref 80.0–100.0)
Platelets: 216 10*3/uL (ref 150–400)
RBC: 5.99 MIL/uL — ABNORMAL HIGH (ref 4.22–5.81)
RDW: 15.4 % (ref 11.5–15.5)
WBC: 5.6 10*3/uL (ref 4.0–10.5)
nRBC: 0 % (ref 0.0–0.2)

## 2019-06-21 LAB — CBG MONITORING, ED: Glucose-Capillary: 111 mg/dL — ABNORMAL HIGH (ref 70–99)

## 2019-06-21 IMAGING — DX DG CHEST 1V PORT
1 series · 1 of 1 positions shown · non-contrast
Comparison: [DATE]

CLINICAL DATA: Diaphoresis, headache, nausea

EXAM:
PORTABLE CHEST 1 VIEW

[chest ap]
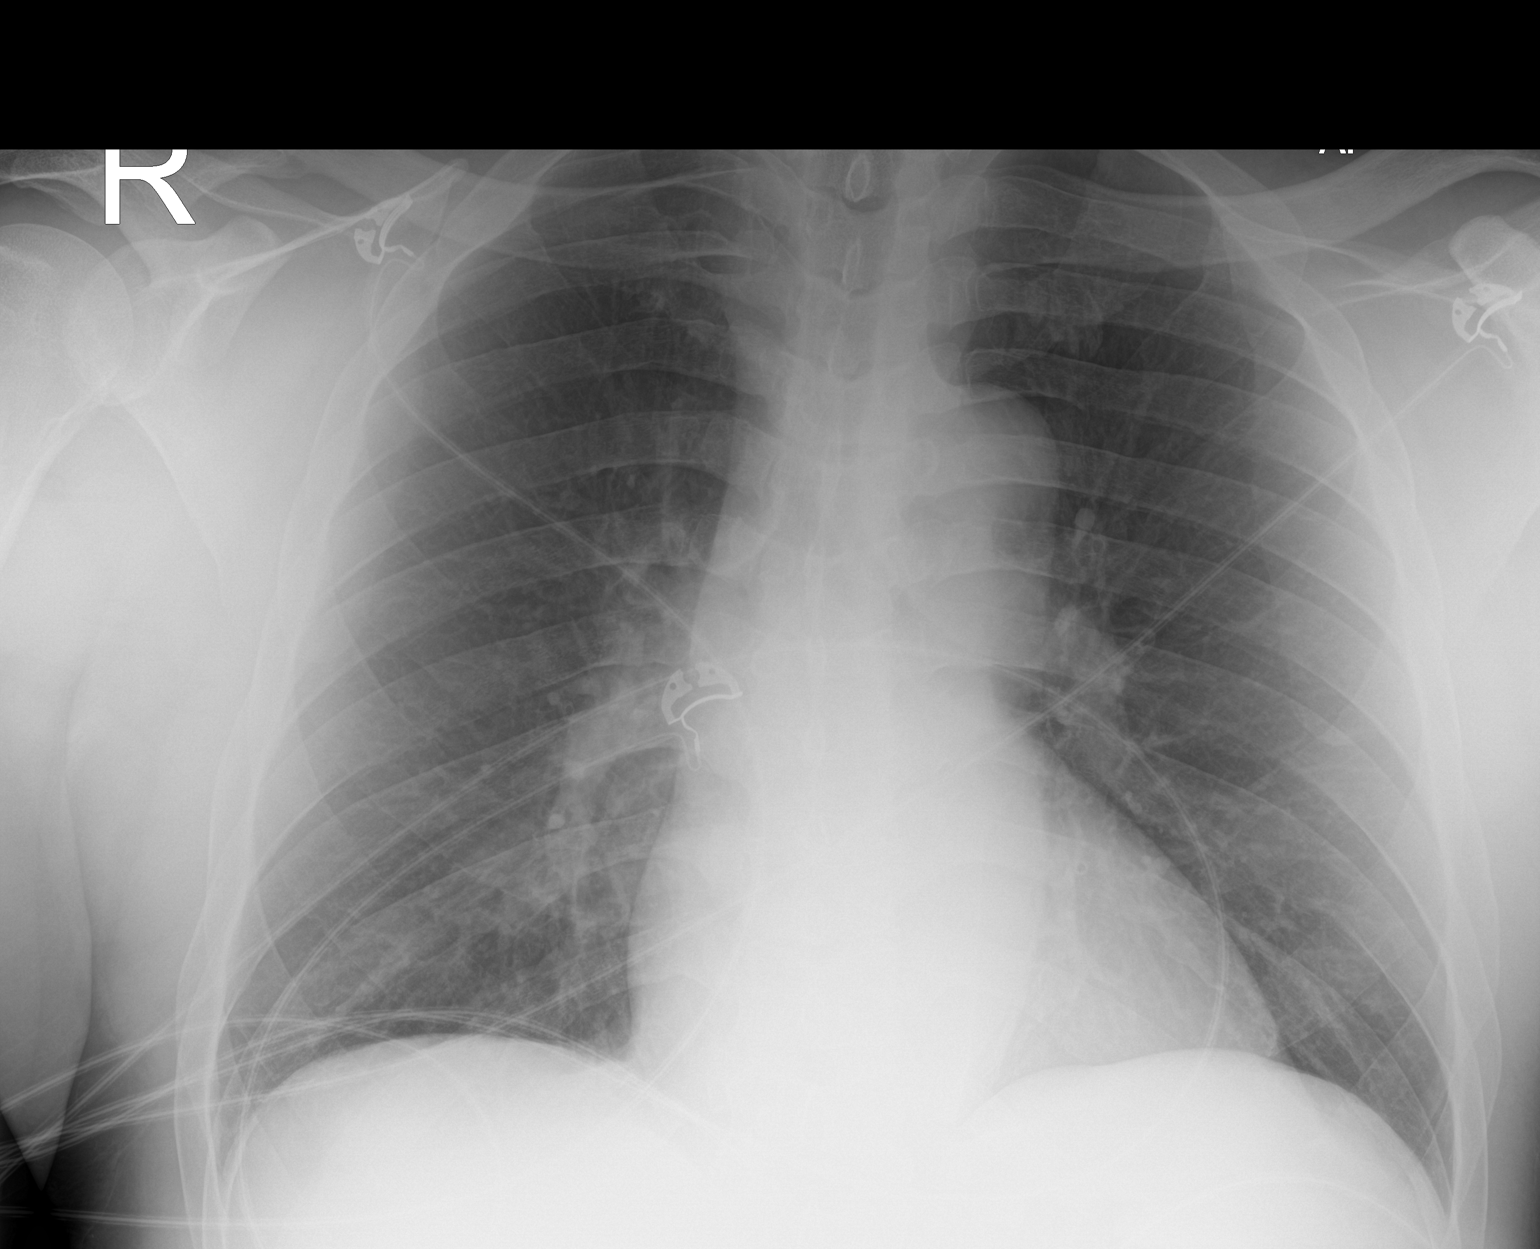

[1 of 1 positions shown; findings below may reference images not displayed]

FINDINGS: The heart size and mediastinal contours are within normal limits.
Both lungs are clear. The visualized skeletal structures are
unremarkable.
IMPRESSION: No acute abnormality of the lungs in AP portable projection.

## 2019-06-21 MED ORDER — ASPIRIN 81 MG PO CHEW
324.0000 mg | CHEWABLE_TABLET | Freq: Once | ORAL | Status: AC
  Administered 2019-06-21: 324 mg via ORAL
  Filled 2019-06-21: qty 4

## 2019-06-21 MED ORDER — ACETAMINOPHEN 500 MG PO TABS
1000.0000 mg | ORAL_TABLET | Freq: Once | ORAL | Status: AC
  Administered 2019-06-21: 1000 mg via ORAL
  Filled 2019-06-21: qty 2

## 2019-06-21 NOTE — ED Triage Notes (Signed)
Pt c/o while at church he began to sweat and having headaches. Pt reports nausea as well.
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
## 2019-06-21 NOTE — ED Notes (Signed)
ED Provider at bedside. 

## 2019-06-21 NOTE — ED Notes (Signed)
Pt given coke and graham crackers.  

## 2019-06-21 NOTE — Discharge Instructions (Addendum)
Please follow-up with your primary care doctor.  Please take your hydroxyzine as prescribed.  Your results today reassuring.  It does appear that you are very dehydrated.  Please increase how much water you are drinking daily.  Please return to ED if you have any new or concerning symptoms.

## 2019-06-21 NOTE — ED Notes (Signed)
XR at bedside

## 2019-06-22 LAB — SARS CORONAVIRUS 2 (TAT 6-24 HRS): SARS Coronavirus 2: POSITIVE — AB

## 2019-06-23 ENCOUNTER — Other Ambulatory Visit: Payer: Self-pay | Admitting: Unknown Physician Specialty

## 2019-06-23 ENCOUNTER — Telehealth: Payer: Self-pay | Admitting: Unknown Physician Specialty

## 2019-06-23 DIAGNOSIS — U071 COVID-19: Secondary | ICD-10-CM

## 2019-06-23 NOTE — Telephone Encounter (Signed)
  I connected by phone with Caleb Hunter on 06/23/2019 at 12:12 PM to discuss the potential use of an new treatment for mild to moderate COVID-19 viral infection in non-hospitalized patients.  This patient is a 53 y.o. male that meets the FDA criteria for Emergency Use Authorization of bamlanivimab or casirivimab\imdevimab.  Has a (+) direct SARS-CoV-2 viral test result  Has mild or moderate COVID-19   Is ? 51 years of age and weighs ? 40 kg  Is NOT hospitalized due to COVID-19  Is NOT requiring oxygen therapy or requiring an increase in baseline oxygen flow rate due to COVID-19  Is within 10 days of symptom onset  Has at least one of the high risk factor(s) for progression to severe COVID-19 and/or hospitalization as defined in EUA.  Specific high risk criteria : BMI >/= 35   I have spoken and communicated the following to the patient or parent/caregiver:  1. FDA has authorized the emergency use of bamlanivimab and casirivimab\imdevimab for the treatment of mild to moderate COVID-19 in adults and pediatric patients with positive results of direct SARS-CoV-2 viral testing who are 61 years of age and older weighing at least 40 kg, and who are at high risk for progressing to severe COVID-19 and/or hospitalization.  2. The significant known and potential risks and benefits of bamlanivimab and casirivimab\imdevimab, and the extent to which such potential risks and benefits are unknown.  3. Information on available alternative treatments and the risks and benefits of those alternatives, including clinical trials.  4. Patients treated with bamlanivimab and casirivimab\imdevimab should continue to self-isolate and use infection control measures (e.g., wear mask, isolate, social distance, avoid sharing personal items, clean and disinfect "high touch" surfaces, and frequent handwashing) according to CDC guidelines.   5. The patient or parent/caregiver has the option to accept or refuse  bamlanivimab or casirivimab\imdevimab .  After reviewing this information with the patient, The patient agreed to proceed with receiving the bamlanimivab infusion and will be provided a copy of the Fact sheet prior to receiving the infusion.Caleb Hunter 06/23/2019 12:12 PM

## 2019-06-25 ENCOUNTER — Ambulatory Visit (HOSPITAL_COMMUNITY)
Admission: RE | Admit: 2019-06-25 | Discharge: 2019-06-25 | Disposition: A | Discharge status: Home / Self Care | Payer: Medicaid Other | Source: Ambulatory Visit | Attending: Pulmonary Disease | Admitting: Pulmonary Disease

## 2019-06-25 DIAGNOSIS — U071 COVID-19: Secondary | ICD-10-CM | POA: Diagnosis not present

## 2019-06-25 MED ORDER — DIPHENHYDRAMINE HCL 50 MG/ML IJ SOLN: 50.0000 mg | Freq: Once | INTRAMUSCULAR | Status: DC | PRN

## 2019-06-25 MED ORDER — METHYLPREDNISOLONE SODIUM SUCC 125 MG IJ SOLR: 125.0000 mg | Freq: Once | INTRAMUSCULAR | Status: DC | PRN

## 2019-06-25 MED ORDER — ALBUTEROL SULFATE HFA 108 (90 BASE) MCG/ACT IN AERS: 2.0000 | INHALATION_SPRAY | Freq: Once | RESPIRATORY_TRACT | Status: DC | PRN

## 2019-06-25 MED ORDER — SODIUM CHLORIDE 0.9 % IV SOLN
INTRAVENOUS | Status: DC | PRN
  Administered 2019-06-25: 09:00:00 250 mL via INTRAVENOUS

## 2019-06-25 MED ORDER — EPINEPHRINE 0.3 MG/0.3ML IJ SOAJ: 0.3000 mg | Freq: Once | INTRAMUSCULAR | Status: DC | PRN

## 2019-06-25 MED ORDER — SODIUM CHLORIDE 0.9 % IV SOLN
700.0000 mg | Freq: Once | INTRAVENOUS | Status: AC
  Administered 2019-06-25: 700 mg via INTRAVENOUS
  Filled 2019-06-25: qty 20

## 2019-06-25 MED ORDER — FAMOTIDINE IN NACL 20-0.9 MG/50ML-% IV SOLN: 20.0000 mg | Freq: Once | INTRAVENOUS | Status: DC | PRN

## 2019-06-25 NOTE — Progress Notes (Signed)
  Diagnosis: COVID-19  Physician:  Procedure: [facility] Med: bamlanivimab infusion - Provided patient with bamlanimivab fact sheet for patients, parents and caregivers prior to infusion.  Complications: No immediate complications noted.  Discharge: Discharged home   Chandell Attridge 06/25/2019   

## 2019-09-30 ENCOUNTER — Emergency Department (HOSPITAL_BASED_OUTPATIENT_CLINIC_OR_DEPARTMENT_OTHER)
Admission: EM | Admit: 2019-09-30 | Discharge: 2019-09-30 | Discharge status: Against Medical Advice | Payer: Medicaid Other | Attending: Emergency Medicine | Admitting: Emergency Medicine

## 2019-09-30 ENCOUNTER — Encounter (HOSPITAL_BASED_OUTPATIENT_CLINIC_OR_DEPARTMENT_OTHER): Payer: Self-pay | Admitting: Emergency Medicine

## 2019-09-30 ENCOUNTER — Other Ambulatory Visit: Payer: Self-pay

## 2019-09-30 DIAGNOSIS — T783XXA Angioneurotic edema, initial encounter: Secondary | ICD-10-CM | POA: Insufficient documentation

## 2019-09-30 DIAGNOSIS — Z5320 Procedure and treatment not carried out because of patient's decision for unspecified reasons: Secondary | ICD-10-CM | POA: Insufficient documentation

## 2019-09-30 DIAGNOSIS — Z79899 Other long term (current) drug therapy: Secondary | ICD-10-CM | POA: Diagnosis not present

## 2019-09-30 DIAGNOSIS — Z7984 Long term (current) use of oral hypoglycemic drugs: Secondary | ICD-10-CM | POA: Insufficient documentation

## 2019-09-30 DIAGNOSIS — J45909 Unspecified asthma, uncomplicated: Secondary | ICD-10-CM | POA: Diagnosis not present

## 2019-09-30 DIAGNOSIS — I10 Essential (primary) hypertension: Secondary | ICD-10-CM | POA: Diagnosis not present

## 2019-09-30 DIAGNOSIS — R600 Localized edema: Secondary | ICD-10-CM | POA: Diagnosis present

## 2019-09-30 LAB — BASIC METABOLIC PANEL
Anion gap: 13 (ref 5–15)
BUN: 17 mg/dL (ref 6–20)
CO2: 23 mmol/L (ref 22–32)
Calcium: 9 mg/dL (ref 8.9–10.3)
Chloride: 100 mmol/L (ref 98–111)
Creatinine, Ser: 1.04 mg/dL (ref 0.61–1.24)
GFR calc Af Amer: >60 mL/min (ref >60)
GFR calc non Af Amer: >60 mL/min (ref >60)
Glucose, Bld: 94 mg/dL (ref 70–99)
Potassium: 3.5 mmol/L (ref 3.5–5.1)
Sodium: 136 mmol/L (ref 135–145)

## 2019-09-30 LAB — CBC WITH DIFFERENTIAL/PLATELET
Abs Immature Granulocytes: 0.02 10*3/uL (ref 0.00–0.07)
Basophils Absolute: 0.1 10*3/uL (ref 0.0–0.1)
Basophils Relative: 1 %
Eosinophils Absolute: 0.2 10*3/uL (ref 0.0–0.5)
Eosinophils Relative: 3 %
HCT: 48.6 % (ref 39.0–52.0)
Hemoglobin: 15.8 g/dL (ref 13.0–17.0)
Immature Granulocytes: 0 %
Lymphocytes Relative: 41 %
Lymphs Abs: 3.2 10*3/uL (ref 0.7–4.0)
MCH: 27.5 pg (ref 26.0–34.0)
MCHC: 32.5 g/dL (ref 30.0–36.0)
MCV: 84.7 fL (ref 80.0–100.0)
Monocytes Absolute: 0.5 10*3/uL (ref 0.1–1.0)
Monocytes Relative: 6 %
Neutro Abs: 3.9 10*3/uL (ref 1.7–7.7)
Neutrophils Relative %: 49 %
Platelets: 209 10*3/uL (ref 150–400)
RBC: 5.74 MIL/uL (ref 4.22–5.81)
RDW: 14.2 % (ref 11.5–15.5)
WBC: 7.8 10*3/uL (ref 4.0–10.5)
nRBC: 0 % (ref 0.0–0.2)

## 2019-09-30 MED ORDER — METHYLPREDNISOLONE SODIUM SUCC 125 MG IJ SOLR
125.0000 mg | Freq: Once | INTRAMUSCULAR | Status: AC
  Administered 2019-09-30: 125 mg via INTRAVENOUS
  Filled 2019-09-30: qty 2

## 2019-09-30 MED ORDER — EPINEPHRINE 0.3 MG/0.3ML IJ SOAJ: 0.3000 mg | Freq: Once | INTRAMUSCULAR | Status: DC

## 2019-09-30 MED ORDER — EPINEPHRINE PF 1 MG/ML IJ SOLN
0.3000 mg | Freq: Once | INTRAMUSCULAR | Status: AC
  Administered 2019-09-30: 0.3 mg via INTRAMUSCULAR
  Filled 2019-09-30: qty 1

## 2019-09-30 MED ORDER — DIPHENHYDRAMINE HCL 50 MG/ML IJ SOLN
25.0000 mg | Freq: Once | INTRAMUSCULAR | Status: AC
  Administered 2019-09-30: 25 mg via INTRAVENOUS
  Filled 2019-09-30: qty 1

## 2019-09-30 NOTE — Discharge Instructions (Signed)
We recommended admission to the hospital for further monitoring.  If you at any point you develop any tongue swelling, throat swelling, worsening lip swelling or difficulty breathing, return immediately to ER for reassessment.  You should not under any circumstances take lisinopril or other ACE inhibitor.  Discuss additional blood pressure medicines with your primary doctor.

## 2019-09-30 NOTE — ED Notes (Signed)
ED Provider at bedside. 

## 2019-09-30 NOTE — ED Triage Notes (Signed)
Swelling to lips since last night. Took last lisinopril yesterday morning.  Took 2 Benadryls last night.  Swelling only to lips at th is time.  Airway clear. Tongue not swollen.
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
## 2019-10-11 ENCOUNTER — Encounter (HOSPITAL_BASED_OUTPATIENT_CLINIC_OR_DEPARTMENT_OTHER): Payer: Self-pay | Admitting: Emergency Medicine

## 2019-10-11 ENCOUNTER — Emergency Department (HOSPITAL_BASED_OUTPATIENT_CLINIC_OR_DEPARTMENT_OTHER): Payer: Medicaid Other

## 2019-10-11 ENCOUNTER — Emergency Department (HOSPITAL_BASED_OUTPATIENT_CLINIC_OR_DEPARTMENT_OTHER)
Admission: EM | Admit: 2019-10-11 | Discharge: 2019-10-11 | Disposition: A | Discharge status: Home / Self Care | Payer: Medicaid Other | Attending: Emergency Medicine | Admitting: Emergency Medicine

## 2019-10-11 ENCOUNTER — Other Ambulatory Visit: Payer: Self-pay

## 2019-10-11 DIAGNOSIS — M25561 Pain in right knee: Secondary | ICD-10-CM | POA: Diagnosis not present

## 2019-10-11 DIAGNOSIS — Z7984 Long term (current) use of oral hypoglycemic drugs: Secondary | ICD-10-CM | POA: Insufficient documentation

## 2019-10-11 DIAGNOSIS — E119 Type 2 diabetes mellitus without complications: Secondary | ICD-10-CM | POA: Insufficient documentation

## 2019-10-11 DIAGNOSIS — Z79899 Other long term (current) drug therapy: Secondary | ICD-10-CM | POA: Diagnosis not present

## 2019-10-11 DIAGNOSIS — M25461 Effusion, right knee: Secondary | ICD-10-CM | POA: Diagnosis not present

## 2019-10-11 DIAGNOSIS — I10 Essential (primary) hypertension: Secondary | ICD-10-CM | POA: Insufficient documentation

## 2019-10-11 IMAGING — DX DG KNEE COMPLETE 4+V*R*
4 series · 4 of 4 positions shown · non-contrast
Comparison: None.

CLINICAL DATA: Right knee pain and swelling for 3 days.  No injury.

EXAM:
RIGHT KNEE - COMPLETE 4+ VIEW

[knee ap]
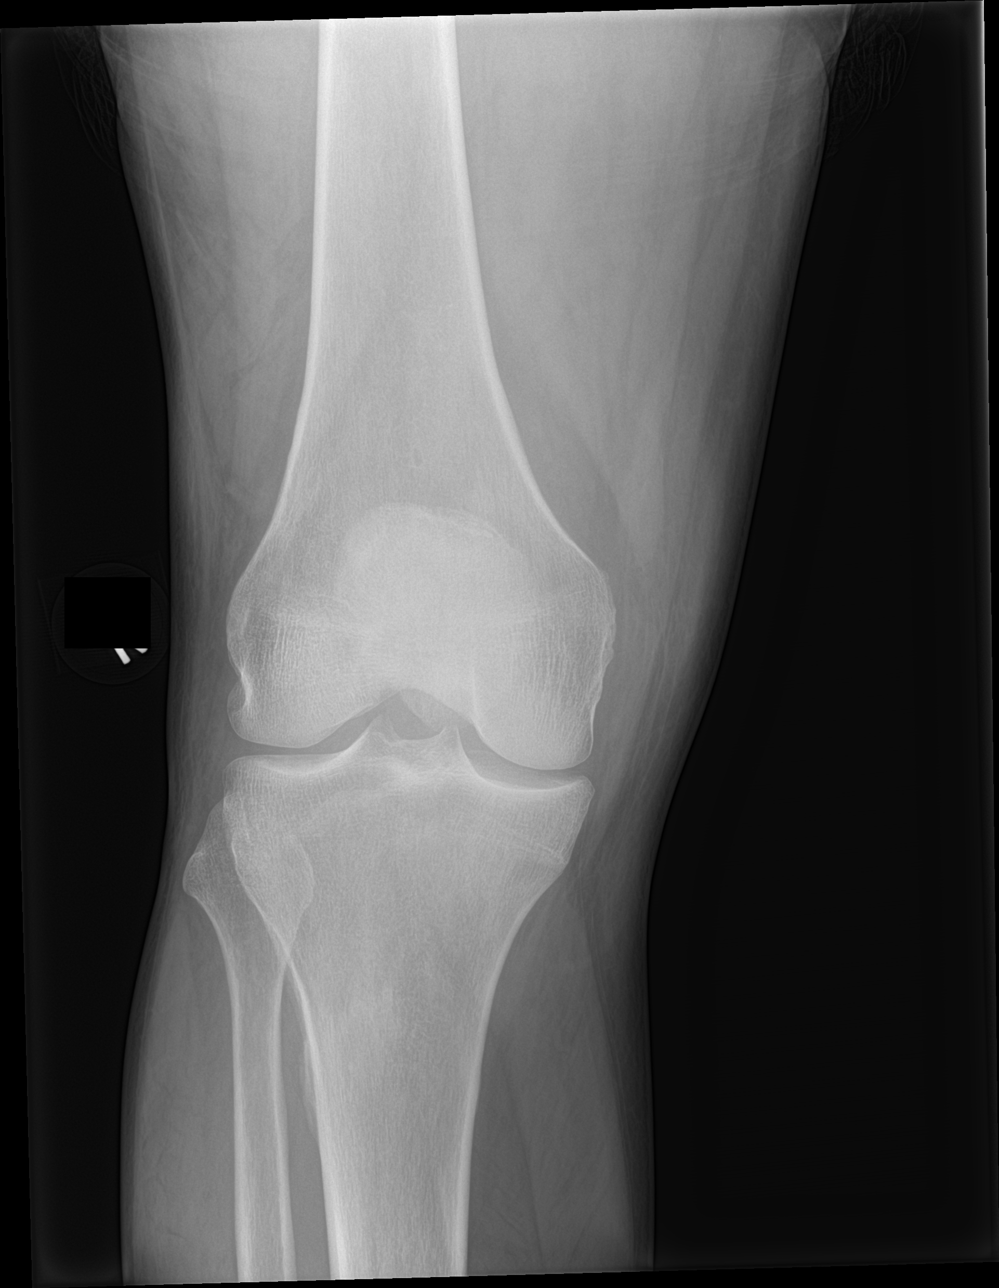

[knee obl (1 of 2)]
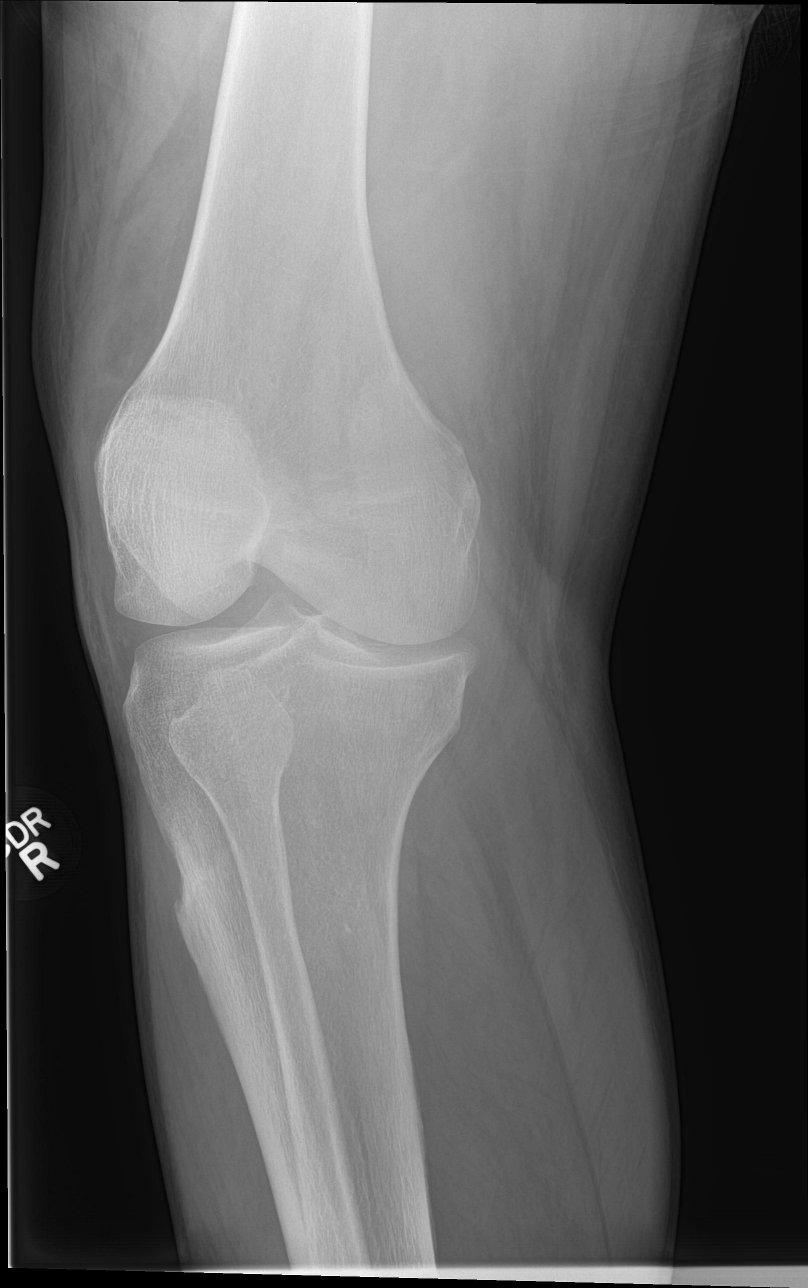

[knee obl (2 of 2)]
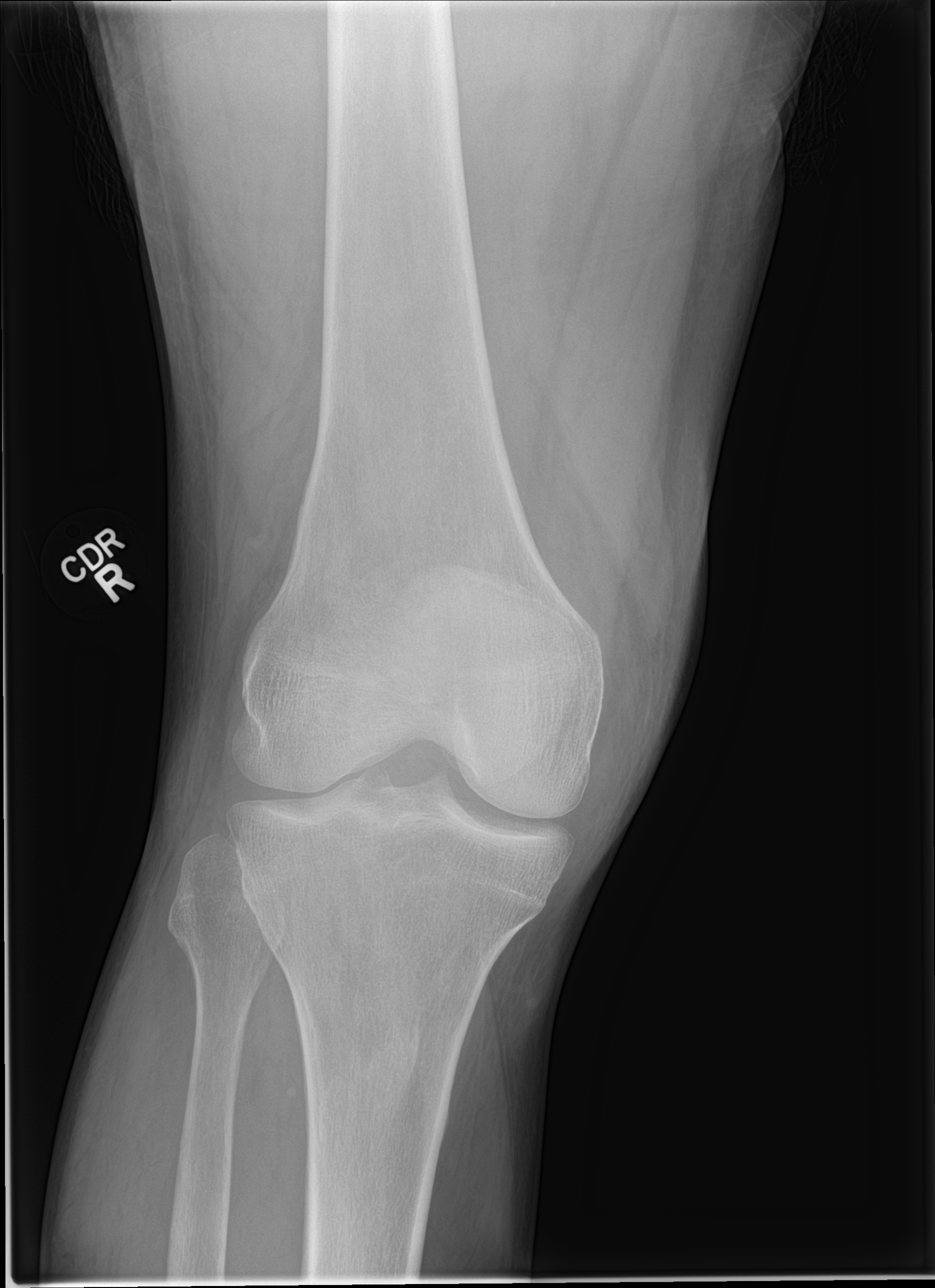

[knee lat]
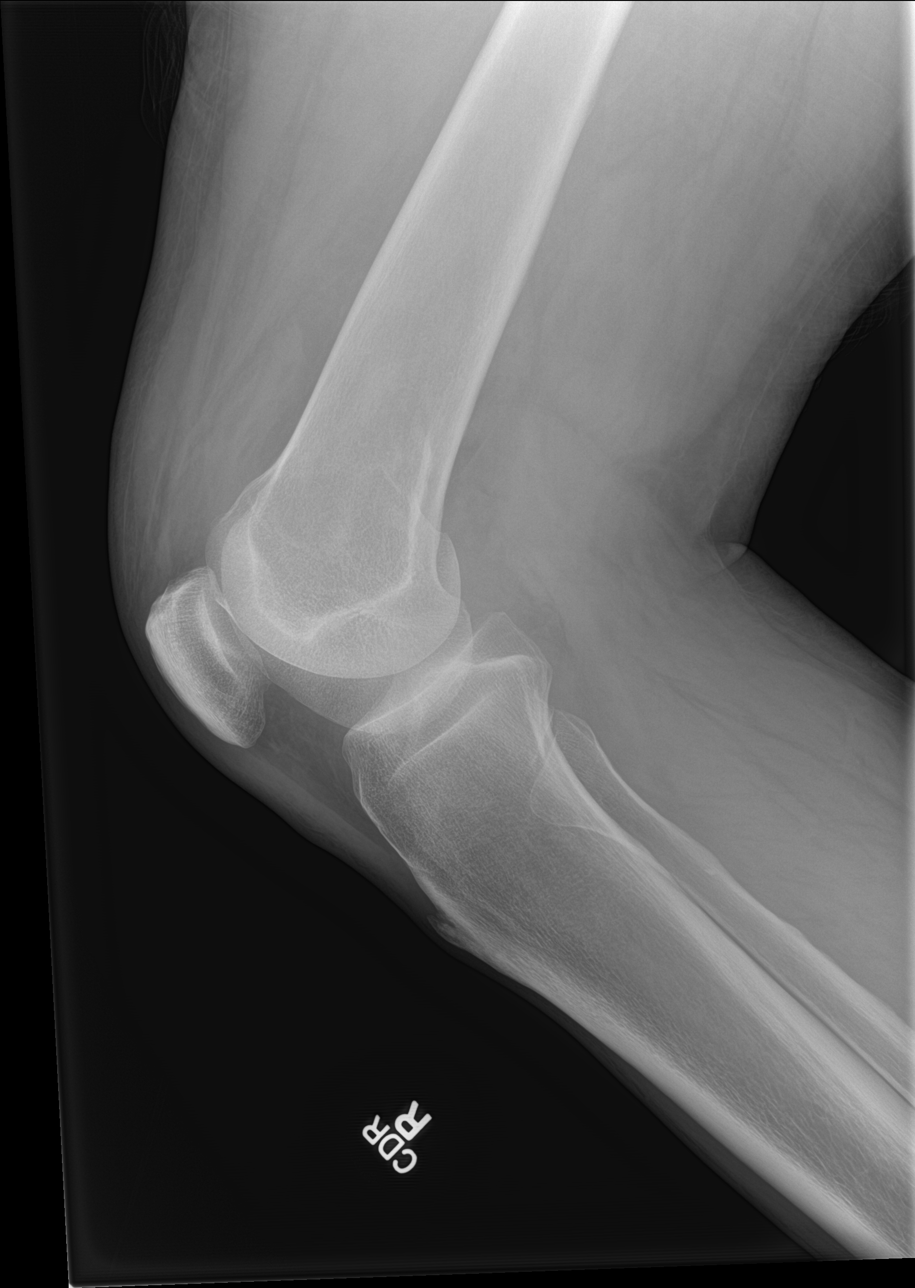

[4 of 4 positions shown; findings below may reference images not displayed]

FINDINGS: No fracture or bone lesion.

Knee joint normally spaced and aligned.  No arthropathic changes.

Small joint effusion.

Soft tissues are unremarkable.
IMPRESSION: 1. Small joint effusion.  No other abnormality.

## 2019-10-11 MED ORDER — KETOROLAC TROMETHAMINE 60 MG/2ML IM SOLN
60.0000 mg | Freq: Once | INTRAMUSCULAR | Status: AC
  Administered 2019-10-11: 60 mg via INTRAMUSCULAR
  Filled 2019-10-11: qty 2

## 2019-10-11 NOTE — ED Provider Notes (Signed)
TIME SEEN: 3:41 AM  CHIEF COMPLAINT: Right knee pain  HPI: Patient is a 54 year old male who presents to the emergency department with right knee pain.  States started yesterday.  No injury that he can recall.  No fever.  No calf swelling or calf tenderness.  Able to ambulate but this causes pain.  No previous history of knee problems.  Patient just received 120 10 mg oxycodone tablets on 10/07/2019.  It appears he receives regular narcotic pain medications for pain management.  ROS: See HPI Constitutional: no fever  Eyes: no drainage  ENT: no runny nose   Cardiovascular:  no chest pain  Resp: no SOB  GI: no vomiting GU: no dysuria Integumentary: no rash  Allergy: no hives  Musculoskeletal: no leg swelling  Neurological: no slurred speech ROS otherwise negative  PAST MEDICAL HISTORY/PAST SURGICAL HISTORY:  Past Medical History:  Diagnosis Date  . Arthritis   . Asthma   . Chronic pain    Pain management  . Depression   . Diabetes mellitus without complication (HCC)    pt states borderline  . Gout   . High cholesterol   . History of stomach ulcers   . Hypertension     MEDICATIONS:  Prior to Admission medications   Medication Sig Start Date End Date Taking? Authorizing Provider  Cholecalciferol (VITAMIN D3) 25 MCG (1000 UT) CAPS TK 1 C PO D 04/23/18  Yes [provider]  gabapentin (NEURONTIN) 300 MG capsule Take 300 mg by mouth 2 (two) times daily. 07/21/19  Yes [provider]  hydrochlorothiazide (HYDRODIURIL) 25 MG tablet Take 25 mg by mouth daily. 07/31/19  Yes [provider]  montelukast (SINGULAIR) 10 MG tablet TK 1 T PO  QPM 06/07/15  Yes [provider]  ondansetron (ZOFRAN-ODT) 4 MG disintegrating tablet Take 1 tablet (4 mg total) by mouth every 8 (eight) hours as needed for nausea or vomiting. 03/07/19  Yes Benjiman Core, MD  Oxycodone HCl 10 MG TABS TK 1 T PO QID 05/05/18  Yes [provider]  albuterol (PROVENTIL  HFA;VENTOLIN HFA) 108 (90 Base) MCG/ACT inhaler Inhale 2 puffs into the lungs every 6 (six) hours as needed for wheezing or shortness of breath. 03/29/17   Kellie Shropshire, PA-C  amLODipine (NORVASC) 10 MG tablet TK 1 T PO QD 10/21/17   [provider]  atorvastatin (LIPITOR) 40 MG tablet Take 40 mg by mouth at bedtime. 09/02/19   [provider]  ATORVASTATIN CALCIUM PO Take by mouth.    [provider]  budesonide-formoterol (SYMBICORT) 160-4.5 MCG/ACT inhaler Inhale 2 puffs into the lungs daily. 08/25/18   Vanetta Mulders, MD  ezetimibe (ZETIA) 10 MG tablet Take 10 mg by mouth daily. 09/02/19   [provider]  hydroquinone 4 % cream Apply topically See admin instructions. 06/16/19   [provider]  hydrOXYzine (ATARAX/VISTARIL) 25 MG tablet Take 1 tablet (25 mg total) by mouth every 8 (eight) hours as needed. 06/16/19   Dayzha Pogosyan, Layla Maw, DO  lisinopril (PRINIVIL,ZESTRIL) 40 MG tablet TK 1 T PO QD 10/21/17   [provider]  metFORMIN (GLUCOPHAGE) 500 MG tablet TK 1/2 T PO BID 12/23/17   [provider]  omeprazole (PRILOSEC) 20 MG capsule Take 1 capsule (20 mg total) by mouth daily. 05/07/16   Arby Barrette, MD  phentermine (ADIPEX-P) 37.5 MG tablet Take 37.5 mg by mouth daily. 09/14/19   [provider]  tiZANidine (ZANAFLEX) 4 MG tablet Take 4 mg by  mouth every 8 (eight) hours as needed. 07/21/19   [provider]    ALLERGIES:  Allergies  Allergen Reactions  . Lisinopril Swelling    Lip angioedema  . Shellfish Allergy Anaphylaxis    SOCIAL HISTORY:  Social History   Tobacco Use  . Smoking status: Never Smoker  . Smokeless tobacco: Never Used  Substance Use Topics  . Alcohol use: Yes    Alcohol/week: 1.0 standard drinks    Types: 1 Cans of beer per week    Comment: occ    FAMILY HISTORY: No family history on file.  EXAM: BP (!) 164/94 (BP Location: Right Arm)   Pulse 85   Temp 98.4 F (36.9 C)  (Oral)   Resp 18   Ht 6\' 3"  (1.905 m)   Wt 120.7 kg   SpO2 94%   BMI 33.25 kg/m  CONSTITUTIONAL: Alert and oriented and responds appropriately to questions. Well-appearing; well-nourished, obese HEAD: Normocephalic EYES: Conjunctivae clear, pupils appear equal, EOM appear intact ENT: normal nose; moist mucous membranes NECK: Supple, normal ROM CARD: RRR; S1 and S2 appreciated; no murmurs, no clicks, no rubs, no gallops RESP: Normal chest excursion without splinting or tachypnea; breath sounds clear and equal bilaterally; no wheezes, no rhonchi, no rales, no hypoxia or respiratory distress, speaking full sentences ABD/GI: Normal bowel sounds; non-distended; soft, non-tender, no rebound, no guarding, no peritoneal signs, no hepatosplenomegaly BACK:  The back appears normal EXT: Tender over the anterior right knee with mild to moderate joint effusion and soft tissue swelling.  Slightly warm to touch but no erythema.  No induration or fluctuance.  He is able to fully flex and extend the right knee but flexion does cause pain.  No ligamentous laxity on exam.  No calf tenderness or calf swelling.  2+ right DP pulse.  No bony deformity.  Compartments soft.  Reports normal sensation in the right leg.  Normal capillary refill. SKIN: Normal color for age and race; warm; no rash on exposed skin NEURO: Moves all extremities equally PSYCH: The patient's mood and manner are appropriate.   MEDICAL DECISION MAKING: Patient here with right knee pain.  He does have mild to moderate joint effusion on exam.  No sign of septic arthritis, gout.  Neurovascular intact distally.  Doubt DVT, arterial obstruction.  No sign of cellulitis.  He is on chronic narcotic pain medication and just had a prescription filled several days ago.  Will give IM Toradol here.  Will apply ice to the knee and obtain x-ray to evaluate for fracture although low suspicion given no direct injury.  ED PROGRESS: X-ray independently reviewed and  interpreted by myself and radiologist.  Small joint effusion noted but no bony abnormality.  No fracture or dislocation.  Pain is improved with Toradol.  Ace wrap applied for comfort and compression.  He is able to ambulate.  Recommended a cane that can be obtained over-the-counter to use as needed.  Will give outpatient sports medicine follow-up if symptoms not improving.   At this time, I do not feel there is any life-threatening condition present. I have reviewed, interpreted and discussed all results (EKG, imaging, lab, urine as appropriate) and exam findings with patient/family. I have reviewed nursing notes and appropriate previous records.  I feel the patient is safe to be discharged home without further emergent workup and can continue workup as an outpatient as needed. Discussed usual and customary return precautions. Patient/family verbalize understanding and are comfortable with this plan.  Outpatient follow-up has  been provided as needed. All questions have been answered.      Caleb Hunter was evaluated in Emergency Department on 10/11/2019 for the symptoms described in the history of present illness. He was evaluated in the context of the global COVID-19 pandemic, which necessitated consideration that the patient might be at risk for infection with the SARS-CoV-2 virus that causes COVID-19. Institutional protocols and algorithms that pertain to the evaluation of patients at risk for COVID-19 are in a state of rapid change based on information released by regulatory bodies including the CDC and federal and state organizations. These policies and algorithms were followed during the patient's care in the ED.      Kellie Murrill, Layla Maw, DO 10/11/19 [phone]

## 2019-10-11 NOTE — Discharge Instructions (Addendum)
You may take ibuprofen 800 mg every 8 hours as needed for pain.  This medication is found over-the-counter.  Please take with food.  Please do not take for more than 7 days.

## 2019-10-11 NOTE — ED Triage Notes (Signed)
Right knee pain and swelling x3 days. Denies injury. CMS intact.

## 2020-06-09 ENCOUNTER — Ambulatory Visit (INDEPENDENT_AMBULATORY_CARE_PROVIDER_SITE_OTHER): Payer: Medicaid Other

## 2020-06-09 ENCOUNTER — Other Ambulatory Visit: Payer: Self-pay

## 2020-06-09 ENCOUNTER — Ambulatory Visit: Payer: Medicaid Other | Admitting: Podiatry

## 2020-06-09 DIAGNOSIS — M79671 Pain in right foot: Secondary | ICD-10-CM

## 2020-06-09 DIAGNOSIS — M205X1 Other deformities of toe(s) (acquired), right foot: Secondary | ICD-10-CM

## 2020-06-09 DIAGNOSIS — M2041 Other hammer toe(s) (acquired), right foot: Secondary | ICD-10-CM

## 2020-06-09 DIAGNOSIS — M2042 Other hammer toe(s) (acquired), left foot: Secondary | ICD-10-CM | POA: Diagnosis not present

## 2020-06-09 DIAGNOSIS — M2012 Hallux valgus (acquired), left foot: Secondary | ICD-10-CM | POA: Diagnosis not present

## 2020-06-09 DIAGNOSIS — M2011 Hallux valgus (acquired), right foot: Secondary | ICD-10-CM

## 2020-06-09 DIAGNOSIS — M21611 Bunion of right foot: Secondary | ICD-10-CM

## 2020-06-09 DIAGNOSIS — M79672 Pain in left foot: Secondary | ICD-10-CM

## 2020-06-09 DIAGNOSIS — M21612 Bunion of left foot: Secondary | ICD-10-CM

## 2020-06-09 DIAGNOSIS — M205X2 Other deformities of toe(s) (acquired), left foot: Secondary | ICD-10-CM

## 2020-06-12 ENCOUNTER — Encounter: Payer: Self-pay | Admitting: Podiatry

## 2020-06-12 NOTE — Progress Notes (Signed)
  Subjective:  Patient ID: Lenward Able, male    DOB: [birth year],  MRN: [id number]  Chief Complaint  Patient presents with  . Foot Pain    B/L foot pain. PT stated that he gets cramps in his toes and whole foot all the time and it is very painful this has been going on for years    54 y.o. male presents with the above complaint. History confirmed with patient. He is interested in surgical correction of the issues. Most of the pain is around the bunion and toes on each foot. The left foot is worse than the right.  Objective:  Physical Exam: warm, good capillary refill, no trophic changes or ulcerative lesions, normal DP and PT pulses and normal sensory exam. Bilaterally there is moderate to severe hallux valgus with smooth pain free ROM of the MTPJ, hammertoe contractures 2,3 and mallet toe with adductovarus at the DIPJ on the 4th.  Radiographs: X-ray of both feet: mild pes planus with severe hallux valgus, moderate metatarsus adductus and hammertoe 2,3 and mallet toe 4 bilaterally Assessment:   1. Foot pain, bilateral   2. Hallux valgus with bunions, left   3. Hallux valgus with bunions, right   4. Hammertoe of left foot   5. Hammertoe of right foot   6. Mallet toe of left foot   7. Mallet toe, right      Plan:  Patient was evaluated and treated and all questions answered.  Discussed the etiology and treatment including surgical and non surgical treatment for painful bunions and hammertoes. He has exhausted all non surgical treatment prior to this visit including shoe gear changes and padding. He desires surgical intervention. We discussed all risks including but not limited to: pain, swelling, infection, scar, numbness which may be temporary or permanent, chronic pain, stiffness, nerve pain or damage, wound healing problems, bone healing problems including delayed or non-union and recurrence. Specifically we discussed the following procedures: Lapidus bunionectomy with 1st TMT  arthrodesis, bone graft from heel, and hammertoe correction of 2-4 with PIPJ correction on the 4th. We will plan to intervene on both feet, starting with the left. Informed consent was signed today. Surgery will be scheduled at a mutually agreeable date. Information regarding this will be forwarded to our surgery scheduler.   Surgical plan:  Procedure: -left foot Lapidus bunionectomy, with autograft from left calcaneus, hammertoe correction 2,3, mallet toe correction 4  Location: -[facility]  Anesthesia plan: -IV sedation with regional block  Postoperative pain plan: - Tylenol 1000 mg every 6 hours, ibuprofen 600 mg every 6 hours, gabapentin 300 mg every 8 hours x5 days, oxycodone 5 mg 1-2 tabs every 6 hours only as needed  DVT prophylaxis: -ASA 325mg  BID  WB Restrictions / DME needs: -NWB LLE in a posterior splint  No follow-ups on file.

## 2020-06-21 ENCOUNTER — Other Ambulatory Visit: Payer: Self-pay | Admitting: Podiatry

## 2020-06-21 DIAGNOSIS — M2011 Hallux valgus (acquired), right foot: Secondary | ICD-10-CM

## 2020-06-21 DIAGNOSIS — M2012 Hallux valgus (acquired), left foot: Secondary | ICD-10-CM

## 2020-07-22 ENCOUNTER — Other Ambulatory Visit (HOSPITAL_COMMUNITY)
Admission: RE | Admit: 2020-07-22 | Discharge: 2020-07-22 | Disposition: A | Discharge status: Home / Self Care | Payer: Medicaid Other | Source: Ambulatory Visit | Attending: Podiatry | Admitting: Podiatry

## 2020-07-22 DIAGNOSIS — Z01812 Encounter for preprocedural laboratory examination: Secondary | ICD-10-CM | POA: Diagnosis not present

## 2020-07-22 DIAGNOSIS — Z20822 Contact with and (suspected) exposure to covid-19: Secondary | ICD-10-CM | POA: Diagnosis not present

## 2020-07-22 LAB — SARS CORONAVIRUS 2 (TAT 6-24 HRS): SARS Coronavirus 2: NEGATIVE

## 2020-07-25 ENCOUNTER — Other Ambulatory Visit: Payer: Self-pay

## 2020-07-25 ENCOUNTER — Encounter (HOSPITAL_BASED_OUTPATIENT_CLINIC_OR_DEPARTMENT_OTHER): Payer: Self-pay | Admitting: Podiatry

## 2020-07-25 NOTE — Progress Notes (Signed)
Spoke w/ via phone for pre-op interview--- PT Lab needs dos----Istat and EKG               Lab results------ no COVID test ------ done 07-22-2020 negative result in epic Arrive at -------  1045 NPO after MN NO Solid Food.  Clear liquids from MN until--- 0945 Medications to take morning of surgery ----- Colchisen, Norvasc, Albuterol nebulizer Diabetic medication ----- pt does not take any medication Patient Special Instructions ----- advised to do his nebulizer tonight and in the morning and bring his rescue inhaler dos Pre-Op special Istructions ----- pt pcp H&P dated 07-04-2020 done by Norva Riffle PA that was received via fax from Dr Lilian Kapur office is w/ chart. Patient verbalized understanding of instructions that were given at this phone interview. Patient denies shortness of breath, chest pain, fever, cough at this phone interview.

## 2020-07-26 NOTE — Anesthesia Preprocedure Evaluation (Addendum)
Anesthesia Evaluation  Patient identified by MRN, date of birth, ID band Patient awake    Reviewed: Allergy & Precautions, NPO status , Patient's Chart, lab work & pertinent test results, reviewed documented beta blocker date and time   Airway Mallampati: II  TM Distance: >3 FB Neck ROM: Full    Dental  (+) Dental Advisory Given   Pulmonary shortness of breath, asthma ,    Pulmonary exam normal breath sounds clear to auscultation       Cardiovascular hypertension, Pt. on medications and Pt. on home beta blockers Normal cardiovascular exam Rhythm:Regular Rate:Normal     Neuro/Psych PSYCHIATRIC DISORDERS Depression negative neurological ROS     GI/Hepatic Neg liver ROS, PUD,   Endo/Other  negative endocrine ROS  Renal/GU negative Renal ROS     Musculoskeletal  (+) Arthritis ,   Abdominal (+) + obese,   Peds  Hematology negative hematology ROS (+)   Anesthesia Other Findings   Reproductive/Obstetrics                           Anesthesia Physical Anesthesia Plan  ASA: II  Anesthesia Plan: General   Post-op Pain Management:  Regional for Post-op pain   Induction:   PONV Risk Score and Plan: 3 and Ondansetron, Dexamethasone, Treatment may vary due to age or medical condition and Midazolam  Airway Management Planned: LMA  Additional Equipment: None  Intra-op Plan:   Post-operative Plan: Extubation in OR  Informed Consent: I have reviewed the patients History and Physical, chart, labs and discussed the procedure including the risks, benefits and alternatives for the proposed anesthesia with the patient or authorized representative who has indicated his/her understanding and acceptance.     Dental advisory given  Plan Discussed with: CRNA  Anesthesia Plan Comments:      Anesthesia Quick Evaluation

## 2020-07-29 ENCOUNTER — Other Ambulatory Visit (HOSPITAL_COMMUNITY)
Admission: RE | Admit: 2020-07-29 | Discharge: 2020-07-29 | Disposition: A | Discharge status: Home / Self Care | Payer: Medicaid Other | Source: Ambulatory Visit | Attending: Podiatry | Admitting: Podiatry

## 2020-07-29 DIAGNOSIS — Z01812 Encounter for preprocedural laboratory examination: Secondary | ICD-10-CM | POA: Insufficient documentation

## 2020-07-29 DIAGNOSIS — Z20822 Contact with and (suspected) exposure to covid-19: Secondary | ICD-10-CM | POA: Diagnosis not present

## 2020-07-29 LAB — SARS CORONAVIRUS 2 (TAT 6-24 HRS): SARS Coronavirus 2: NEGATIVE

## 2020-07-29 NOTE — Progress Notes (Addendum)
Spoke with pt and made aware equiptment is in for surgery ker [facility] and covid test scheduled for now, pt aware to arribe 1000 am 08-02-2020 and clear liquids until 900 am then npo  Addendum: H & P dated 12-27-202 received [facility]ley [facility] pa on chart for 08-02-2020 surgery

## 2020-08-02 ENCOUNTER — Other Ambulatory Visit: Payer: Self-pay

## 2020-08-02 ENCOUNTER — Ambulatory Visit (HOSPITAL_COMMUNITY): Payer: Medicaid Other

## 2020-08-02 ENCOUNTER — Ambulatory Visit (HOSPITAL_BASED_OUTPATIENT_CLINIC_OR_DEPARTMENT_OTHER)
Admission: RE | Admit: 2020-08-02 | Discharge: 2020-08-02 | Disposition: A | Discharge status: Home / Self Care | Payer: Medicaid Other | Attending: Podiatry | Admitting: Podiatry

## 2020-08-02 ENCOUNTER — Encounter (HOSPITAL_BASED_OUTPATIENT_CLINIC_OR_DEPARTMENT_OTHER): Payer: Self-pay | Admitting: Podiatry

## 2020-08-02 ENCOUNTER — Ambulatory Visit (HOSPITAL_BASED_OUTPATIENT_CLINIC_OR_DEPARTMENT_OTHER): Payer: Medicaid Other | Admitting: Anesthesiology

## 2020-08-02 ENCOUNTER — Encounter: Payer: Medicaid Other | Admitting: Podiatry

## 2020-08-02 ENCOUNTER — Encounter (HOSPITAL_BASED_OUTPATIENT_CLINIC_OR_DEPARTMENT_OTHER)
Admission: RE | Disposition: A | Discharge status: Home / Self Care | Payer: Self-pay | Source: Home / Self Care | Attending: Podiatry

## 2020-08-02 DIAGNOSIS — M2012 Hallux valgus (acquired), left foot: Secondary | ICD-10-CM | POA: Diagnosis not present

## 2020-08-02 DIAGNOSIS — M2042 Other hammer toe(s) (acquired), left foot: Secondary | ICD-10-CM | POA: Insufficient documentation

## 2020-08-02 DIAGNOSIS — M2142 Flat foot [pes planus] (acquired), left foot: Secondary | ICD-10-CM | POA: Insufficient documentation

## 2020-08-02 DIAGNOSIS — M216X2 Other acquired deformities of left foot: Secondary | ICD-10-CM | POA: Diagnosis not present

## 2020-08-02 DIAGNOSIS — M21612 Bunion of left foot: Secondary | ICD-10-CM | POA: Diagnosis not present

## 2020-08-02 DIAGNOSIS — M205X2 Other deformities of toe(s) (acquired), left foot: Secondary | ICD-10-CM

## 2020-08-02 DIAGNOSIS — Z9889 Other specified postprocedural states: Secondary | ICD-10-CM

## 2020-08-02 HISTORY — PX: AIKEN OSTEOTOMY: SHX6331

## 2020-08-02 HISTORY — DX: Dyspnea, unspecified: R06.00

## 2020-08-02 HISTORY — DX: Hyperlipidemia, unspecified: E78.5

## 2020-08-02 HISTORY — DX: Unspecified asthma, uncomplicated: J45.909

## 2020-08-02 HISTORY — PX: HALLUX VALGUS LAPIDUS: SHX6626

## 2020-08-02 HISTORY — PX: HAMMER TOE SURGERY: SHX385

## 2020-08-02 LAB — POCT I-STAT, CHEM 8
BUN: 21 mg/dL — ABNORMAL HIGH (ref 6–20)
Calcium, Ion: 1.23 mmol/L (ref 1.15–1.40)
Chloride: 98 mmol/L (ref 98–111)
Creatinine, Ser: 1.2 mg/dL (ref 0.61–1.24)
Glucose, Bld: 112 mg/dL — ABNORMAL HIGH (ref 70–99)
HCT: 47 % (ref 39.0–52.0)
Hemoglobin: 16 g/dL (ref 13.0–17.0)
Potassium: 3.5 mmol/L (ref 3.5–5.1)
Sodium: 137 mmol/L (ref 135–145)
TCO2: 28 mmol/L (ref 22–32)

## 2020-08-02 IMAGING — DX DG FOOT COMPLETE 3+V*L*
3 series · 3 of 3 positions shown · non-contrast
Comparison: [DATE]

CLINICAL DATA: Left foot surgery, postoperative examination.

EXAM:
LEFT FOOT - COMPLETE 3+ VIEW

[foot ap]
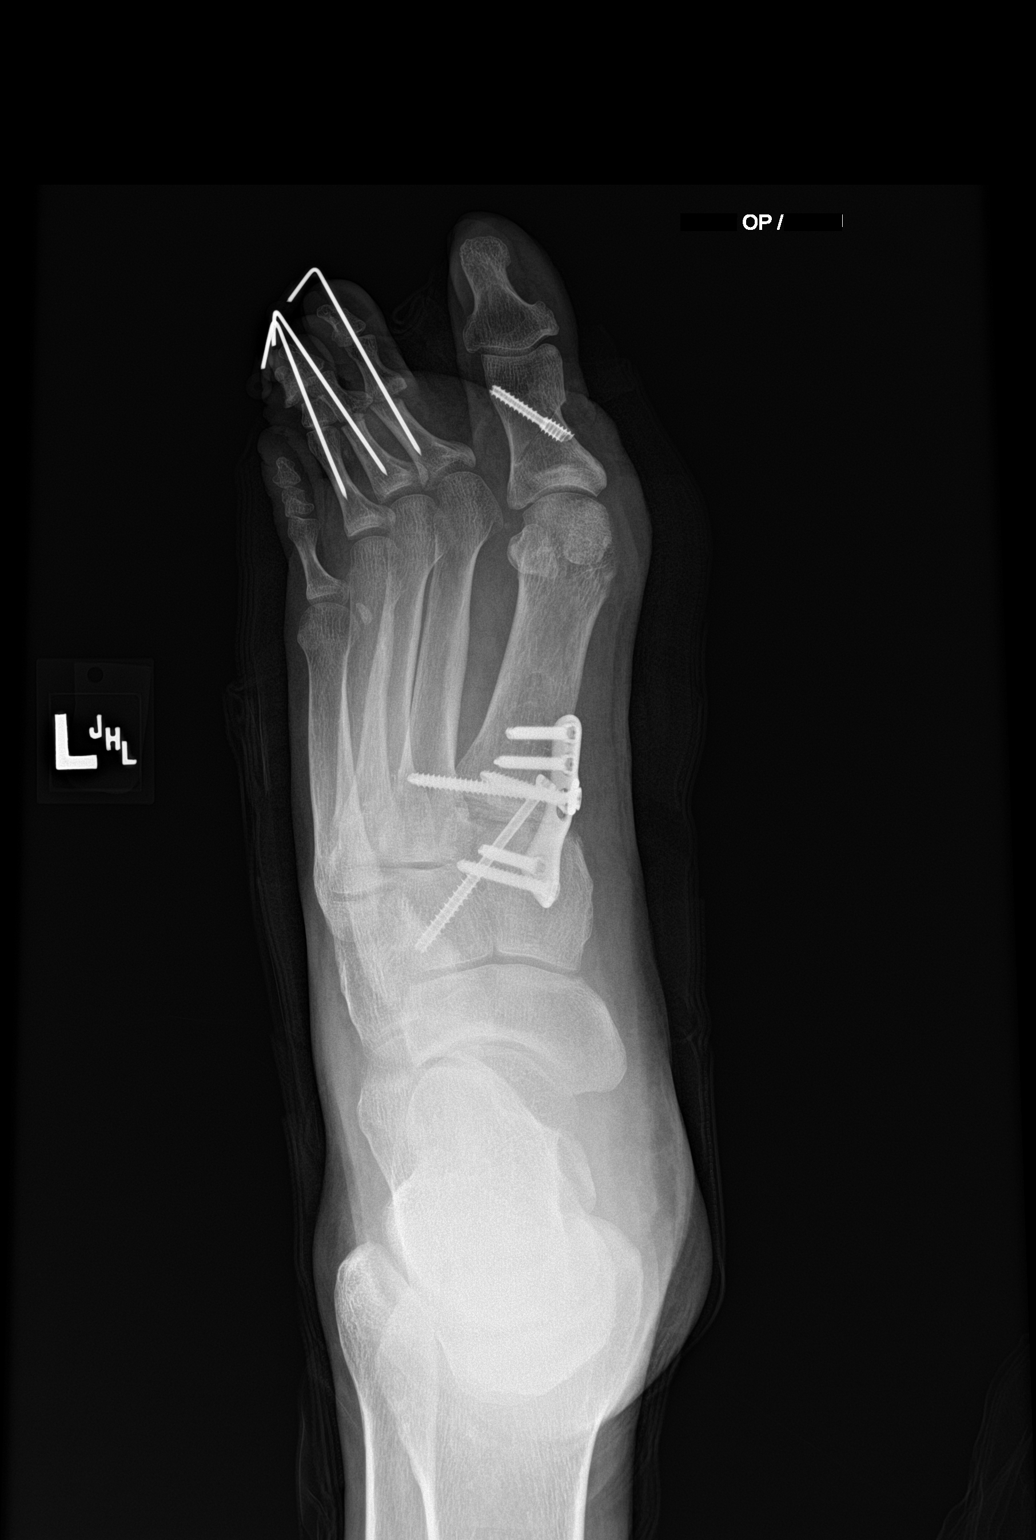

[foot obl]
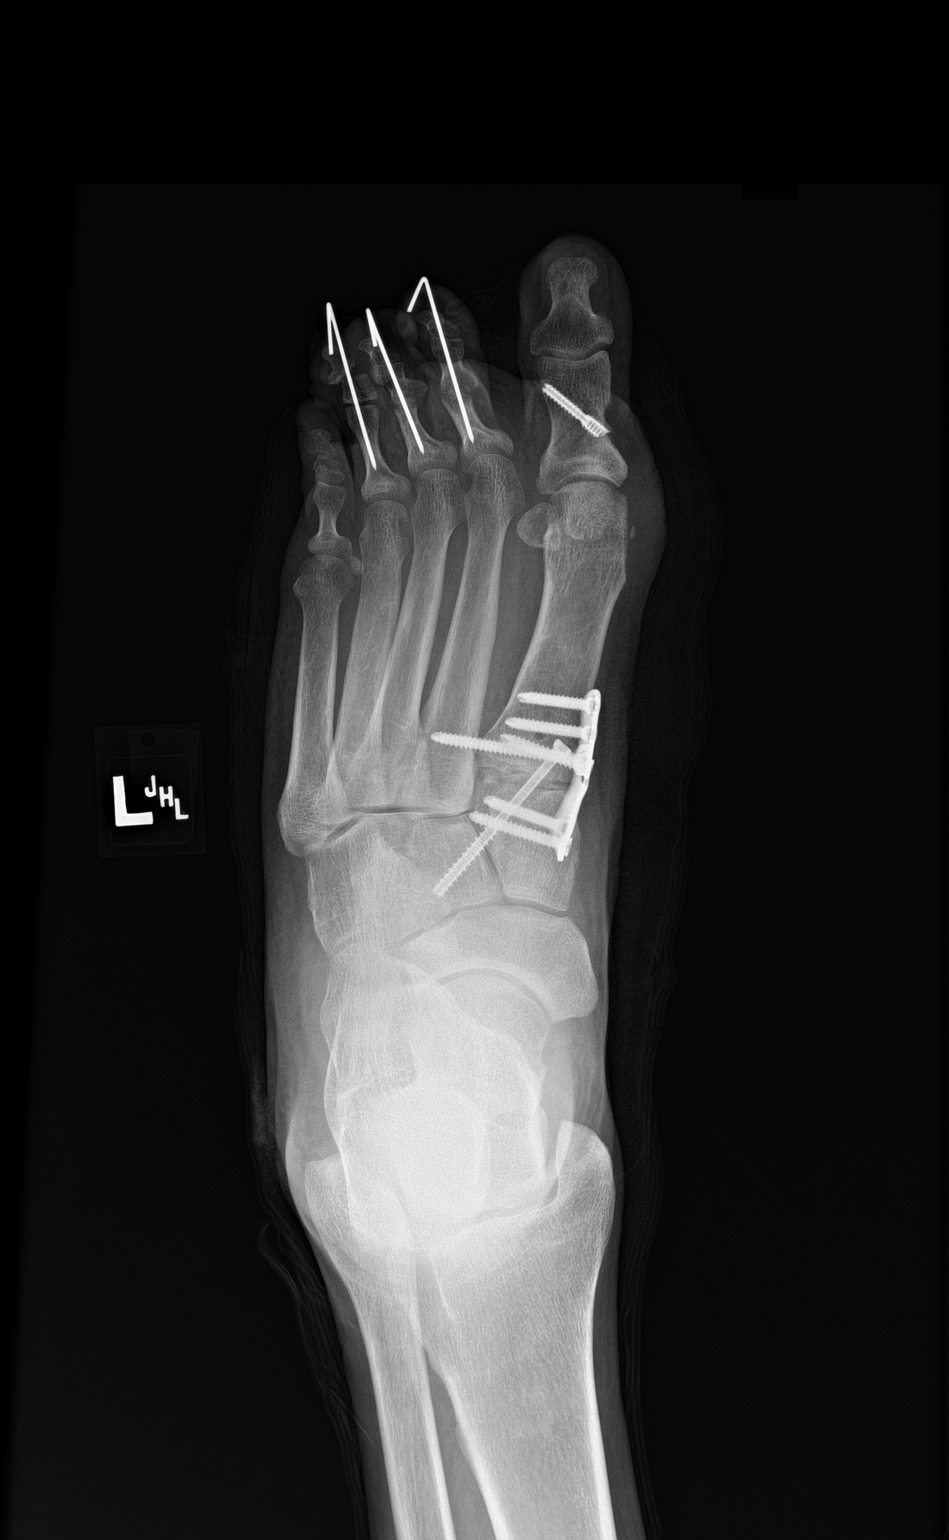

[foot lat]
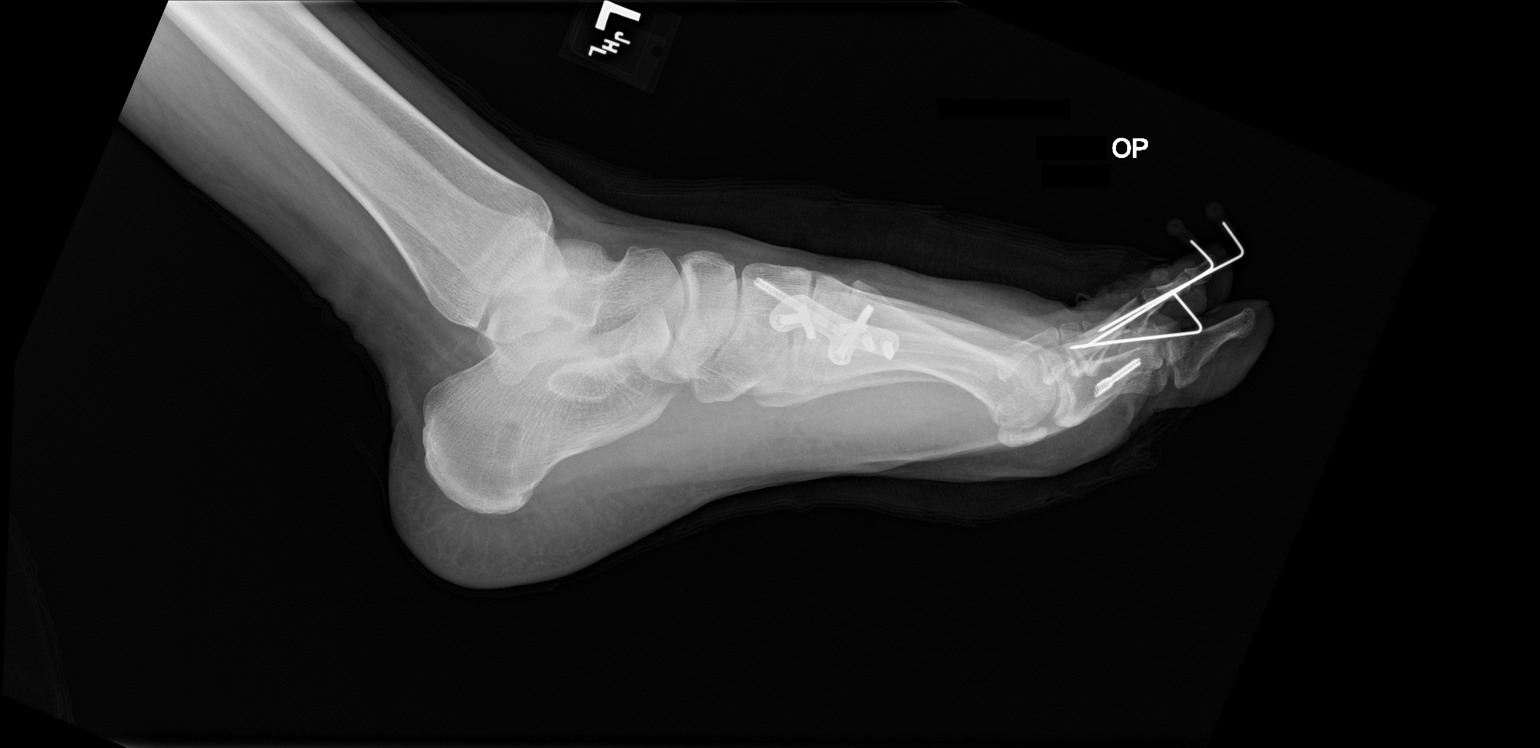

[3 of 3 positions shown; findings below may reference images not displayed]

FINDINGS: Three view radiograph left foot demonstrates surgical changes of
bunionectomy with a variable rotation screw within the first
proximal phalanx. First tarsometatarsal joint arthrodesis as well as
Lisfranc arthrodesis with instrumented fusion of the articulation of
the medial and middle cuneiform and a single screw traversing the
base of the first and second metatarsals. There is normal alignment
of the first ray. The Lisfranc interval is normal.

Hammertoe correction of the second, third, and fourth digits has
been performed with osteotomy is identified within the a second and
third proximal phalanges and fourth middle phalanx. Axial K-wires
are in place. No unexpected fracture or dislocation. Remaining joint
spaces are preserved.
IMPRESSION: Postsurgical changes as described above.  Normal overall alignment.

## 2020-08-02 SURGERY — BUNIONECTOMY, LAPIDUS
Anesthesia: General | Site: Toe | Laterality: Left

## 2020-08-02 MED ORDER — MEPERIDINE HCL 25 MG/ML IJ SOLN
6.2500 mg | INTRAMUSCULAR | Status: DC | PRN
Start: 2020-08-02 — End: 2020-08-02

## 2020-08-02 MED ORDER — FENTANYL CITRATE (PF) 100 MCG/2ML IJ SOLN
INTRAMUSCULAR | Status: AC
  Filled 2020-08-02: qty 2

## 2020-08-02 MED ORDER — ACETAMINOPHEN 500 MG PO TABS: 1000.0000 mg | ORAL_TABLET | Freq: Four times a day (QID) | ORAL | 0 refills | Status: AC | PRN

## 2020-08-02 MED ORDER — LACTATED RINGERS IV SOLN: INTRAVENOUS | Status: DC

## 2020-08-02 MED ORDER — OXYCODONE HCL 5 MG PO TABS: 5.0000 mg | ORAL_TABLET | Freq: Four times a day (QID) | ORAL | 0 refills | Status: DC | PRN

## 2020-08-02 MED ORDER — CLONIDINE HCL (ANALGESIA) 100 MCG/ML EP SOLN
EPIDURAL | Status: DC | PRN
  Administered 2020-08-02: 30 ug
  Administered 2020-08-02: 60 ug

## 2020-08-02 MED ORDER — LIDOCAINE HCL 2 % IJ SOLN
INTRAMUSCULAR | Status: AC
  Filled 2020-08-02: qty 20

## 2020-08-02 MED ORDER — DEXAMETHASONE SODIUM PHOSPHATE 10 MG/ML IJ SOLN
INTRAMUSCULAR | Status: AC
  Filled 2020-08-02: qty 1

## 2020-08-02 MED ORDER — ASPIRIN EC 325 MG PO TBEC: 325.0000 mg | DELAYED_RELEASE_TABLET | Freq: Two times a day (BID) | ORAL | 11 refills | Status: DC

## 2020-08-02 MED ORDER — LIDOCAINE 2% (20 MG/ML) 5 ML SYRINGE
INTRAMUSCULAR | Status: DC | PRN
  Administered 2020-08-02: 100 mg via INTRAVENOUS

## 2020-08-02 MED ORDER — FENTANYL CITRATE (PF) 100 MCG/2ML IJ SOLN
50.0000 ug | Freq: Once | INTRAMUSCULAR | Status: AC
  Administered 2020-08-02 (×2): 50 ug via INTRAVENOUS

## 2020-08-02 MED ORDER — PROPOFOL 10 MG/ML IV BOLUS
INTRAVENOUS | Status: DC | PRN
  Administered 2020-08-02: 250 mg via INTRAVENOUS

## 2020-08-02 MED ORDER — PROMETHAZINE HCL 25 MG/ML IJ SOLN: 6.2500 mg | INTRAMUSCULAR | Status: DC | PRN

## 2020-08-02 MED ORDER — CEFAZOLIN SODIUM-DEXTROSE 2-4 GM/100ML-% IV SOLN
INTRAVENOUS | Status: AC
  Filled 2020-08-02: qty 100

## 2020-08-02 MED ORDER — ONDANSETRON HCL 4 MG/2ML IJ SOLN
INTRAMUSCULAR | Status: DC | PRN
  Administered 2020-08-02: 4 mg via INTRAVENOUS

## 2020-08-02 MED ORDER — DEXAMETHASONE SODIUM PHOSPHATE 4 MG/ML IJ SOLN
INTRAMUSCULAR | Status: DC | PRN
  Administered 2020-08-02: 3 mg via PERINEURAL
  Administered 2020-08-02: 4 mg via PERINEURAL
  Administered 2020-08-02: 10 mg via PERINEURAL

## 2020-08-02 MED ORDER — MIDAZOLAM HCL 2 MG/2ML IJ SOLN
2.0000 mg | Freq: Once | INTRAMUSCULAR | Status: AC
  Administered 2020-08-02: 2 mg via INTRAVENOUS

## 2020-08-02 MED ORDER — IBUPROFEN 600 MG PO TABS: 600.0000 mg | ORAL_TABLET | Freq: Three times a day (TID) | ORAL | 0 refills | Status: DC | PRN

## 2020-08-02 MED ORDER — MIDAZOLAM HCL 2 MG/2ML IJ SOLN
INTRAMUSCULAR | Status: AC
  Filled 2020-08-02: qty 2

## 2020-08-02 MED ORDER — OXYCODONE HCL 5 MG PO TABS: 5.0000 mg | ORAL_TABLET | Freq: Once | ORAL | Status: DC | PRN

## 2020-08-02 MED ORDER — 0.9 % SODIUM CHLORIDE (POUR BTL) OPTIME
TOPICAL | Status: DC | PRN
  Administered 2020-08-02: 1000 mL

## 2020-08-02 MED ORDER — GABAPENTIN 300 MG PO CAPS: 300.0000 mg | ORAL_CAPSULE | Freq: Three times a day (TID) | ORAL | 0 refills | Status: DC

## 2020-08-02 MED ORDER — LIDOCAINE HCL (PF) 2 % IJ SOLN
INTRAMUSCULAR | Status: AC
  Filled 2020-08-02: qty 5

## 2020-08-02 MED ORDER — OXYCODONE HCL 5 MG/5ML PO SOLN: 5.0000 mg | Freq: Once | ORAL | Status: DC | PRN

## 2020-08-02 MED ORDER — ONDANSETRON HCL 4 MG/2ML IJ SOLN
INTRAMUSCULAR | Status: AC
  Filled 2020-08-02: qty 2

## 2020-08-02 MED ORDER — BUPIVACAINE HCL (PF) 0.5 % IJ SOLN
INTRAMUSCULAR | Status: AC
  Filled 2020-08-02: qty 30

## 2020-08-02 MED ORDER — CEFAZOLIN SODIUM-DEXTROSE 2-4 GM/100ML-% IV SOLN
2.0000 g | INTRAVENOUS | Status: AC
  Administered 2020-08-02 (×2): 2 g via INTRAVENOUS

## 2020-08-02 MED ORDER — PROPOFOL 10 MG/ML IV BOLUS
INTRAVENOUS | Status: AC
  Filled 2020-08-02: qty 40

## 2020-08-02 MED ORDER — FENTANYL CITRATE (PF) 100 MCG/2ML IJ SOLN: 25.0000 ug | INTRAMUSCULAR | Status: DC | PRN

## 2020-08-02 MED ORDER — BUPIVACAINE-EPINEPHRINE (PF) 0.5% -1:200000 IJ SOLN
INTRAMUSCULAR | Status: DC | PRN
Start: 2020-08-02 — End: 2020-08-02
  Administered 2020-08-02: 15 mL
  Administered 2020-08-02: 25 mL

## 2020-08-02 SURGICAL SUPPLY — 100 items
APL PRP STRL LF DISP 70% ISPRP (MISCELLANEOUS) ×2
BANDAGE ESMARK 6X9 LF (GAUZE/BANDAGES/DRESSINGS) ×2 IMPLANT
BIT DRILL 2.5 (BIT) ×3
BIT DRILL 2.7 (BIT) ×3
BIT DRILL 2.7XCANN (BIT) ×2 IMPLANT
BIT DRILL 60X2.5XDISP (BIT) ×2 IMPLANT
BIT DRILL CANNULAT MICA 2.2X60 (BIT) ×3 IMPLANT
BIT DRILL SURG BONE GRAFT 8 (DRILL) IMPLANT
BIT DRL 2.7XCANN (BIT) ×2
BIT DRL 60X2.5XDISP (BIT) ×2
BLADE AVERAGE 25X9 (BLADE) ×3 IMPLANT
BLADE MINI RND TIP GREEN BEAV (BLADE) ×3 IMPLANT
BLADE OSC/SAG .038X5.5 CUT EDG (BLADE) ×3 IMPLANT
BLADE SURG 15 STRL LF DISP TIS (BLADE) ×4 IMPLANT
BLADE SURG 15 STRL SS (BLADE) ×6
BNDG CMPR 9X4 STRL LF SNTH (GAUZE/BANDAGES/DRESSINGS)
BNDG CMPR 9X6 STRL LF SNTH (GAUZE/BANDAGES/DRESSINGS) ×2
BNDG CONFORM 2 STRL LF (GAUZE/BANDAGES/DRESSINGS) ×3 IMPLANT
BNDG ELASTIC 3X5.8 VLCR STR LF (GAUZE/BANDAGES/DRESSINGS) ×3 IMPLANT
BNDG ELASTIC 4X5.8 VLCR STR LF (GAUZE/BANDAGES/DRESSINGS) ×3 IMPLANT
BNDG ESMARK 4X9 LF (GAUZE/BANDAGES/DRESSINGS) IMPLANT
BNDG ESMARK 6X9 LF (GAUZE/BANDAGES/DRESSINGS) ×3
BNDG GAUZE ELAST 4 BULKY (GAUZE/BANDAGES/DRESSINGS) ×3 IMPLANT
BUR MICA 2X12 (BURR) ×2 IMPLANT
BURR MICA 2X12 (BURR) ×3
CHLORAPREP W/TINT 26 (MISCELLANEOUS) ×3 IMPLANT
COVER BACK TABLE 60X90IN (DRAPES) ×3 IMPLANT
COVER WAND RF STERILE (DRAPES) ×3 IMPLANT
CUFF TOURN SGL QUICK 18X4 (TOURNIQUET CUFF) IMPLANT
DECANTER SPIKE VIAL GLASS SM (MISCELLANEOUS) IMPLANT
DRAPE 3/4 80X56 (DRAPES) ×3 IMPLANT
DRAPE C-ARM 35X43 STRL (DRAPES) ×3 IMPLANT
DRAPE EXTREMITY T 121X128X90 (DISPOSABLE) ×3 IMPLANT
DRAPE U-SHAPE 47X51 STRL (DRAPES) ×3 IMPLANT
DRILL SURG BONE GRAFT 8 ×3 IMPLANT
DRILL SURG BONE GRAFT 8 (DRILL)
DRIVER MICA HEX 2.0 (MISCELLANEOUS) ×3 IMPLANT
ELECT REM PT RETURN 9FT ADLT (ELECTROSURGICAL) ×3
ELECTRODE REM PT RTRN 9FT ADLT (ELECTROSURGICAL) ×2 IMPLANT
GAUGE DEPTH MICA PROSTEP 150 (MISCELLANEOUS) ×3 IMPLANT
GAUZE 4X4 16PLY RFD (DISPOSABLE) IMPLANT
GAUZE SPONGE 4X4 12PLY STRL (GAUZE/BANDAGES/DRESSINGS) ×3 IMPLANT
GAUZE SPONGE 4X4 12PLY STRL LF (GAUZE/BANDAGES/DRESSINGS) ×6 IMPLANT
GAUZE XEROFORM 1X8 LF (GAUZE/BANDAGES/DRESSINGS) ×3 IMPLANT
GAUZE XEROFORM 5X9 LF (GAUZE/BANDAGES/DRESSINGS) ×3 IMPLANT
GLOVE SURG ENC MOIS LTX SZ7 (GLOVE) ×3 IMPLANT
GLOVE SURG UNDER POLY LF SZ7.5 (GLOVE) ×3 IMPLANT
GOWN STRL REUS W/ TWL LRG LVL3 (GOWN DISPOSABLE) ×2 IMPLANT
GOWN STRL REUS W/TWL LRG LVL3 (GOWN DISPOSABLE) ×3
HANDLE AO CANNULATED DRIVER (MISCELLANEOUS) ×3 IMPLANT
K-WIRE CAPS STERILE WHITE .045 (WIRE) ×3 IMPLANT
K-WIRE DBL END TROCAR 6X.045 (WIRE) ×6
K-WIRE DBL TROCAR .045X4 (WIRE) ×6
K-WIRE DBL TRONS .035X6 (WIRE) ×3
K-WIRE THRD 2.5X150 (WIRE) ×6
KIT TURNOVER CYSTO (KITS) ×3 IMPLANT
KWIRE DBL END TROCAR 6X.045 (WIRE) ×4 IMPLANT
KWIRE DBL TROCAR .045X4 (WIRE) ×4 IMPLANT
KWIRE DBL TRONS .035X6 (WIRE) ×2 IMPLANT
KWIRE THRD 2.5X150 (WIRE) ×4 IMPLANT
NDL SAFETY ECLIPSE 18X1.5 (NEEDLE) IMPLANT
NEEDLE HYPO 18GX1.5 SHARP (NEEDLE)
NEEDLE HYPO 25X1 1.5 SAFETY (NEEDLE) IMPLANT
NS IRRIG 1000ML POUR BTL (IV SOLUTION) ×3 IMPLANT
PACK BASIN DAY SURGERY FS (CUSTOM PROCEDURE TRAY) ×3 IMPLANT
PACK INSTRUMENT ORTHOLOC JOINT (ORTHOPEDIC DISPOSABLE SUPPLIES) ×3 IMPLANT
PADDING CAST ABS 4INX4YD NS (CAST SUPPLIES) ×1
PADDING CAST ABS COTTON 4X4 ST (CAST SUPPLIES) ×2 IMPLANT
PENCIL SMOKE EVACUATOR (MISCELLANEOUS) ×3 IMPLANT
PIN TEMP (PIN) ×3 IMPLANT
PLATE TRANSMET LAPIDUS LEFT (Plate) ×3 IMPLANT
SCREW 4.0X40 LONG THREAD (Screw) ×3 IMPLANT
SCREW LOCK 3.5X22 (Screw) ×3 IMPLANT
SCREW LOCK 3.5X26 (Screw) ×6 IMPLANT
SCREW LOCK FT 24X3.5XSTPA (Screw) ×2 IMPLANT
SCREW LOCKING 3.5X24 (Screw) ×3 IMPLANT
SCREW LOW PRO CORT 3.5X40 (Screw) ×3 IMPLANT
SCREW MICA PROSTEP 3.0X22 (Screw) ×3 IMPLANT
SCREW NON LOCK 3.5 X 24 (Screw) ×3 IMPLANT
SPONGE LAP 18X18 RF (DISPOSABLE) ×3 IMPLANT
SPONGE LAP 4X18 RFD (DISPOSABLE) ×3 IMPLANT
STOCKINETTE 6  STRL (DRAPES) ×3
STOCKINETTE 6 STRL (DRAPES) ×2 IMPLANT
STRIP CLOSURE SKIN 1/2X4 (GAUZE/BANDAGES/DRESSINGS) ×3 IMPLANT
SUCTION FRAZIER HANDLE 10FR (MISCELLANEOUS) ×3
SUCTION TUBE FRAZIER 10FR DISP (MISCELLANEOUS) ×2 IMPLANT
SUT ETHILON 4 0 PS 2 18 (SUTURE) ×9 IMPLANT
SUT MNCRL AB 3-0 PS2 18 (SUTURE) ×3 IMPLANT
SUT MNCRL AB 4-0 PS2 18 (SUTURE) ×3 IMPLANT
SUT MON AB 5-0 PS2 18 (SUTURE) ×3 IMPLANT
SUT VIC AB 2-0 SH 27 (SUTURE) ×6
SUT VIC AB 2-0 SH 27XBRD (SUTURE) ×4 IMPLANT
SYR 20ML LL LF (SYRINGE) ×3 IMPLANT
SYR BULB EAR ULCER 3OZ GRN STR (SYRINGE) ×3 IMPLANT
TOWEL OR 17X26 10 PK STRL BLUE (TOWEL DISPOSABLE) ×6 IMPLANT
TRAY DSU PREP LF (CUSTOM PROCEDURE TRAY) IMPLANT
TUBE CONNECTING 12X1/4 (SUCTIONS) ×3 IMPLANT
TUBE SURG IRRIGATION (TUBING) ×3 IMPLANT
UNDERPAD 30X36 HEAVY ABSORB (UNDERPADS AND DIAPERS) ×3 IMPLANT
WIRE GUIDE 1.4 NON THRD (WIRE) ×3 IMPLANT

## 2020-08-02 NOTE — Anesthesia Procedure Notes (Signed)
Procedure Name: LMA Insertion Date/Time: 08/02/2020 12:11 PM Performed by: Lucinda Dell, CRNA Pre-anesthesia Checklist: Patient identified, Emergency Drugs available, Patient being monitored and Suction available Patient Re-evaluated:Patient Re-evaluated prior to induction Oxygen Delivery Method: Circle system utilized Preoxygenation: Pre-oxygenation with 100% oxygen Induction Type: IV induction Ventilation: Mask ventilation without difficulty LMA: LMA inserted LMA Size: 5.0 Number of attempts: 1 Placement Confirmation: positive ETCO2 and breath sounds checked- equal and bilateral Tube secured with: Tape Dental Injury: Teeth and Oropharynx as per pre-operative assessment

## 2020-08-02 NOTE — Brief Op Note (Signed)
08/02/2020  4:25 PM  PATIENT:  Caleb Hunter  55 y.o. male  PRE-OPERATIVE DIAGNOSIS:  HAMMER TOES AND BUNION LEFT FOOT  POST-OPERATIVE DIAGNOSIS:  HAMMER TOES AND BUNION LEFT FOOT  PROCEDURE:  Procedure(s) with comments: HALLUX VALGUS LAPIDUS (Left) - Mini C-arm needed AIKEN OSTEOTOMY (Left) HAMMER TOE CORRECTION   BONE GRAFT FROM HEEL (Left)  SURGEON:  Surgeon(s) and Role:    * Uthman Mroczkowski, Rachelle Hora, DPM - Primary  ASSISTANTS: none   ANESTHESIA:   regional and general  EBL:  68mL  BLOOD ADMINISTERED:none  DRAINS: none   LOCAL MEDICATIONS USED:  NONE  SPECIMEN:  No Specimen  DISPOSITION OF SPECIMEN:  N/A  COUNTS:  YES  TOURNIQUET:  * Missing tourniquet times found for documented tourniquets in log: [id number] * Total Tourniquet Time Documented: Calf (Left) - 104 minutes Total: Calf (Left) - 104 minutes   DICTATION: .Note written in EPIC  PLAN OF CARE: Discharge to home after PACU  PATIENT DISPOSITION:  PACU - hemodynamically stable.   Delay start of Pharmacological VTE agent (>24hrs) due to surgical blood loss or risk of bleeding: yes

## 2020-08-02 NOTE — Anesthesia Procedure Notes (Signed)
Anesthesia Regional Block: Adductor canal block   Pre-Anesthetic Checklist: ,, timeout performed, Correct Patient, Correct Site, Correct Laterality, Correct Procedure, Correct Position, site marked, Risks and benefits discussed,  Surgical consent,  Pre-op evaluation,  At surgeon's request and post-op pain management  Laterality: Left  Prep: chloraprep       Needles:  Injection technique: Single-shot  Needle Type: Stimiplex     Needle Length: 9cm  Needle Gauge: 21     Additional Needles:   Procedures:,,,, ultrasound used (permanent image in chart),,,,  Narrative:  Start time: 08/02/2020 11:52 AM End time: 08/02/2020 11:56 AM Injection made incrementally with aspirations every 5 mL.  Performed by: Personally  Anesthesiologist: [name], MD  Additional Notes: BP cuff, EKG monitors applied. Sedation begun. Artery and nerve location verified with U/S and anesthetic injected incrementally, slowly, and after negative aspirations under direct u/s guidance. Good fascial /perineural spread. Tolerated well.

## 2020-08-02 NOTE — Progress Notes (Signed)
Orthopedic Tech Progress Note Patient Details:  Caleb Hunter 26-Mar-1966 [id number]  Patient ID: [name], male   DOB: 1965-08-15, 55 y.o.   MRN: [id number]   [name] 08/02/2020, 4:25 PM Cam walker delivered to RN at surgical center.

## 2020-08-02 NOTE — Discharge Instructions (Signed)
Post-Surgery Instructions  1. If you are recuperating from surgery anywhere other than home, please be sure to leave [place] a number where you can be reached. 2. Go directly home and rest. 3. The keep operated foot (or feet) elevated six inches above the hip when sitting or lying down. 4. Support the elevated foot and leg with pillows under the calf. DO NOT PLACE PILLOWS UNDER THE KNEE. 5. DO NOT REMOVE or get your bandages wet. This will increase your chances of getting an infection. 6. Wear your surgical shoe at all times when you are up. 7. A limited amount of pain and swelling may occur. The skin may take on a bruised appearance. This is no cause for alarm. 8. For slight pain and swelling, apply an ice pack behind the knee and over bandage for 15 minutes every hour. Continue icing until seen in the office. DO NOT apply any form of heat to the area. 9. Have prescription(s) filled immediately and take as directed. 10. Drink lots of liquids, water, and juice. 11. CALL THE OFFICE IMMEDIATELY IF: a. Bleeding continues b. Pain increases and/or does not respond to medication c. Bandage or cast appears too tight d. Any liquids (water, coffee, etc.) have spilled on your bandages. e. Tripping, falling, or stubbing the surgical foot f. If your temperature rises above 101 g. If you have ANY questions at all 12. Please use the crutches, knee scooter, or walker you have prescribed, rented, or purchased. If you are non-weight bearing DO NOT put weight on the operated foot for _________ days. If you are weight-bearing, follow your physicians instructions. You are expected to be: ? weight-bearing ?X non-weight bearing 13. Special Instructions: _____________________________________________________________ _________________________________________________________________________________ _________________________________________________________________________________  14. Your next appointment is:  _________________________________________  If you need to reach the nurse for any reason, please call: [place]: [phone] [place]: [phone] [place]: [phone]   Post Anesthesia Home Care Instructions  Activity: Get plenty of rest for the remainder of the day. A responsible individual must stay with you for 24 hours following the procedure.  For the next 24 hours, DO NOT: -Drive a car -Advertising copywriter -Drink alcoholic beverages -Take any medication unless instructed by your physician -Make any legal decisions or sign important papers.  Meals: Start with liquid foods such as gelatin or soup. Progress to regular foods as tolerated. Avoid greasy, spicy, heavy foods. If nausea and/or vomiting occur, drink only clear liquids until the nausea and/or vomiting subsides. Call your physician if vomiting continues.  Special Instructions/Symptoms: Your throat may feel dry or sore from the anesthesia or the breathing tube placed in your throat during surgery. If this causes discomfort, gargle with warm salt water. The discomfort should disappear within 24 hours.  If you had a scopolamine patch placed behind your ear for the management of post- operative nausea and/or vomiting:  1. The medication in the patch is effective for 72 hours, after which it should be removed.  Wrap patch in a tissue and discard in the trash. Wash hands thoroughly with soap and water. 2. You may remove the patch earlier than 72 hours if you experience unpleasant side effects which may include dry mouth, dizziness or visual disturbances. 3. Avoid touching the patch. Wash your hands with soap and water after contact with the patch.    Regional Anesthesia Blocks  1. Numbness or the inability to move the "blocked" extremity may last from 3-48 hours after placement. The length of time depends on the medication injected  and your individual response to the medication. If the numbness is not  going away after 48 hours, call your surgeon.  2. The extremity that is blocked will need to be protected until the numbness is gone and the  Strength has returned. Because you cannot feel it, you will need to take extra care to avoid injury. Because it may be weak, you may have difficulty moving it or using it. You may not know what position it is in without looking at it while the block is in effect.  3. For blocks in the legs and feet, returning to weight bearing and walking needs to be done carefully. You will need to wait until the numbness is entirely gone and the strength has returned. You should be able to move your leg and foot normally before you try and bear weight or walk. You will need someone to be with you when you first try to ensure you do not fall and possibly risk injury.  4. Bruising and tenderness at the needle site are common side effects and will resolve in a few days.  5. Persistent numbness or new problems with movement should be communicated to the surgeon or the [facility] [phone] [facility] [phone]).

## 2020-08-02 NOTE — Anesthesia Procedure Notes (Signed)
Anesthesia Regional Block: Popliteal block   Pre-Anesthetic Checklist: ,, timeout performed, Correct Patient, Correct Site, Correct Laterality, Correct Procedure, Correct Position, site marked, Risks and benefits discussed,  Surgical consent,  Pre-op evaluation,  At surgeon's request and post-op pain management  Laterality: Left  Prep: chloraprep       Needles:  Injection technique: Single-shot  Needle Type: Stimiplex     Needle Length: 10cm  Needle Gauge: 21     Additional Needles:   Procedures:,,,, ultrasound used (permanent image in chart),,,,  Motor weakness within 5 minutes.   Nerve Stimulator or Paresthesia:  Response: 0.5 mA,   Additional Responses:   Narrative:  Start time: 08/02/2020 11:57 AM End time: 08/02/2020 12:01 PM Injection made incrementally with aspirations every 5 mL.  Performed by: Personally  Anesthesiologist: [name], MD  Additional Notes: Nerve located and needle positioned with direct ultrasound guidance. Good perineural spread. Patient tolerated well.

## 2020-08-02 NOTE — Progress Notes (Signed)
Assisted Dr. Renold Don with left, ultrasound guided, popliteal, adductor canal block. Side rails up, monitors on throughout procedure. See vital signs in flow sheet. Tolerated Procedure well.  Starla Link, RN

## 2020-08-02 NOTE — Transfer of Care (Signed)
Immediate Anesthesia Transfer of Care Note  Patient: Caleb Hunter  Procedure(s) Performed: Procedure(s) (LRB): HALLUX VALGUS LAPIDUS (Left) AIKEN OSTEOTOMY (Left) HAMMER TOE CORRECTION   BONE GRAFT FROM HEEL (Left)  Patient Location: PACU  Anesthesia Type: General  Level of Consciousness: awake, oriented, sedated and patient cooperative  Airway & Oxygen Therapy: Patient Spontanous Breathing and Patient connected to face mask oxygen  Post-op Assessment: Report given to PACU RN and Post -op Vital signs reviewed and stable  Post vital signs: Reviewed and stable  Complications: No apparent anesthesia complications Last Vitals:  Vitals Value Taken Time  BP 131/90 08/02/20 1633  Temp    Pulse 72 08/02/20 1634  Resp 41 08/02/20 1634  SpO2 100 % 08/02/20 1634  Vitals shown include unvalidated device data.  Last Pain:  Vitals:   08/02/20 1008  TempSrc: Oral         Complications: No complications documented.

## 2020-08-02 NOTE — H&P (Signed)
I have reviewed the history and physical for Caleb Hunter dated 07/04/21 from [facility] and reviewed the procedures Lapidus bunionectomy with bone graft from heel, hammertoe correction 2nd, 3rd, 4th toes left foot and see no contraindication to proceed with surgery. All question addressed, informed consent reviewed and signed.  Sharl Ma, DPM 08/02/2020

## 2020-08-02 NOTE — Op Note (Signed)
Patient Name: Caleb Hunter DOB: Jun 13, 1966  MRN: [id number]   Date of Service: 08/02/2020  Surgeon: Dr. 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 Item Serial No. Manufacturer Lot No. LRB No. Used Action  SCREW LOCK 3.5X22 - [id number] Screw SCREW LOCK 3.5X22  [employer]  Left 1 Implanted  SCREW LOCKING 3.5X24 - [id number] Screw SCREW LOCKING 3.5X24  [employer]  Left 1 Implanted  4.0 x 45 lag screw     [employer]  Left 1 Implanted  PLATE TRANSMET LAPIDUS LEFT - [id number] Plate PLATE TRANSMET LAPIDUS LEFT  [employer]  Left 1 Implanted  SCREW LOCK 3.5X22 - [id number] Screw SCREW LOCK 3.5X22  [employer]  Left 1 Implanted  SCREW LOCKING 3.5X24 - [id number] Screw SCREW LOCKING 3.5X24  [employer]  Left 1 Implanted  SCREW NON LOCK 3.5 X 24 - [id number] Screw SCREW NON LOCK 3.5 X 24  [employer]  Left 1 Implanted  SCREW LOCK 3.5X26 - [id number] Screw SCREW LOCK 3.5X26  [employer]  Left 2 Implanted  3.5 x 40 non lock screw     [employer]  Left 1 Implanted  PROSTEP MICA SCREW Screw   [employer]  Left 1 Implanted   Medications: None Complications: None  Indications for Procedure:  This is a 55 y.o. male with a history of painful hammertoe deformities and severe hallux valgus with bunions.  After failing nonsurgical treatment he elected for operative intervention.  All risk, benefits and potential complications were discussed.  Informed consent was signed reviewed prior to surgery.   Procedure in Detail: Patient was identified in pre-operative holding area. Formal consent was signed and the left lower extremity was marked.  He underwent popliteal and saphenous block by the anesthesia team patient was brought back to the operating room. Anesthesia was induced. The extremity was prepped and draped in the usual sterile fashion. Timeout was taken to confirm patient name, laterality, and procedure prior to incision.    Attention was then directed to the left foot where a dorsomedial incision was made over the first tarsometatarsal joint.  This was placed medial to the extensor hallucis longus tendon.  Dissection was carried through subcutaneous tissues, ensuring that all vital neurovascular structures were protected throughout the procedure.  Bleeders were cauterized as necessary.  The deep fascia was incised and the extensor hallucis longus tendon was retracted laterally.  The periosteum was incised and reflected in the first tarsometatarsal joint capsule and ligaments were incised and the joint was exposed.  An osteotome was used to free plantar ligamentous attachments  of the joint.  2.5 mm Steinmann pins were then placed into the medial cuneiform and base of the first metatarsal and a distractor was placed to expose the joint.  The articular surface of the medial cuneiform and base of the first metatarsal was then debrided and resected using accommodation of osteotomes and curettes.  The subchondral bone plate was left  intact.  This was then fenestrated using a 2.5 mm drill once all articular cartilage had been thoroughly resected.  I then directed my attention to the lateral heel where a 1 cm incision was made 2 cm anterior to the posterior border of the heel and 2 cm proximal to the glabrous junction.  A hemostat and freer elevator was used to elevate the soft tissues off the lateral wall the calcaneus.  An Acumed bone graft reamer was used to harvest 2 cc of autogenous calcaneal cancellous bone graft.  This bone graft was taken and placed into the arthrodesis site of the first tarsometatarsal joint.  The calcaneal incision was then irrigated and closed with 4-0 nylon.    The dorsal medial bunion incision was carried distally over the first metatarsal, dissection was then carried bluntly into the first interspace and interval created to place the lateral portion of the jig for the [facility] Lapifuse over the second metatarsal neck.  Medially dissection was carried deep over the medial first metatarsal head which was carried down to the medial capsule.  The proper digital branch of the medial dorsal cutaneous nerve was identified and retracted throughout this portion of the procedure.  The capsule of the first metatarsal head was reflected from the bone and the medial portion of the Lapifuse jig was placed with a 1.3 mm Kirschner wire.  The intermetatarsal angle was then reduced and valgus position of the first ray was also corrected manually and held into position with the Lapifuse jig.  Fluoroscopy confirmed adequate reduction of the deformity.  Some residual hallux abduction remained and I made the decision to perform a lateral soft tissue release through the same incision in the first interspace and under fluoroscopy, the conjoined head of the adductor tendon, lateral capsule and sesamoidal suspensory ligament were then released using a Beaver blade.  Adequate reduction of the deformity was noted on the fluoroscopy.   The jig was held in place and then began fixation of the first tarsometatarsal arthrodesis.  A guidewire for the 4.0 mm partially-threaded interfragmentary screw was then placed from the plantar medial base of the first metatarsal and advanced proximally and dorsally into the intermediate cuneiform under fluoroscopy.  The screw was then drilled and advanced with good compression of the arthrodesis site noted.  The dorsal medial plate was then selected and positioned and secured using a combination of locking and nonlocking screws to provide good apposition to the bone.  Additional compression was gained through the compression slot and the interfragmentary screw was then retightened.  A first or second metatarsal screw was placed through the transmetatarsal slot.  A sagittal saw was then used to resect the dorsal medial bony eminence over the first metatarsal head, this was then rasped smooth.  Residual hallux interphalangeus deformity was noted.  I then carried the dissection distally onto the dorsal proximal phalanx.  The incision was carried to the level of bone medial to the extensor hallucis longus.  An Akin osteotomy medially based wedge was created with a sagittal saw.  The osteotomy was manually reduced and a guidewire for a 3.0 mm ProStep  screw was placed across the osteotomy.  Good compression of the screw across the osteotomy with reduction of the deformity was noted.  Attention was then directed to the second and third digits where a linear incision was made from the base of the proximal phalanx to the distal middle phalanx.  This was carried deep to expose the extensor digitorum longus tendon and a transverse tenotomy was made.  The PIPJ was exposed and the collateral ligaments were released.  The articular surface of the head of the proximal phalanx and the base of the middle phalanx was then resected using a sagittal saw.  A 0.045 inch Kirschner wire was then inserted through the base of the middle  phalanx through the distal phalanx and out the tip of the toe just under the nail.  This was then driven retrograde to the level of the base of the proximal phalanx.  Good compression of the osteotomy site was noted clinically and fluoroscopically with adequate reduction of the deformity.  Identical procedures were performed on the second and third toes.   Attention was then directed to the fourth toe where a linear incision was made over the distal interphalangeal joint.  This was carried deep to expose the extensor digitorum longus tendon and a transverse tenotomy was made.  The DIPJ was exposed and the collateral ligaments were released.  The head of the middle phalanx was then resected using a sagittal saw.  Final films were then taken with a satisfactory result in correction of the deformity.  The wounds were then thoroughly irrigated with normal sterile saline.  The incisions were then closed using 3-0, 4-0, and 5-0 Monocryl and 4-0 nylon.  Steri-Strips were then applied  The foot was then dressed with Xeroform, 4 x 4 gauze, Kling, Kerlix, Ace wrap under light compression.  He was placed into a tall CAM boot. Patient tolerated the procedure well.  All counts were correct and operative procedure.  He was aroused from anesthesia and transferred back to the recovery area in good condition   Disposition: Following a period of post-operative monitoring, patient will be transferred to home.

## 2020-08-03 NOTE — Anesthesia Postprocedure Evaluation (Signed)
Anesthesia Post Note  Patient: Caleb Hunter  Procedure(s) Performed: HALLUX VALGUS LAPIDUS (Left Foot) AIKEN OSTEOTOMY (Left Toe) HAMMER TOE CORRECTION   BONE GRAFT FROM HEEL (Left Toe)     Patient location during evaluation: PACU Anesthesia Type: General Level of consciousness: sedated and patient cooperative Pain management: pain level controlled Vital Signs Assessment: post-procedure vital signs reviewed and stable Respiratory status: spontaneous breathing Cardiovascular status: stable Anesthetic complications: no   No complications documented.  Last Vitals:  Vitals:   08/02/20 1730 08/02/20 1810  BP: 137/90 133/82  Pulse: 73 78  Resp: 18 20  Temp: 36.5 C 36.7 C  SpO2: 96% 98%    Last Pain:  Vitals:   08/03/20 1053  TempSrc:   PainSc: 7    Pain Goal:                   [name]

## 2020-08-04 ENCOUNTER — Telehealth: Payer: Self-pay | Admitting: Podiatry

## 2020-08-04 NOTE — Telephone Encounter (Signed)
Patient has requested return call, stating the pins are causing pain and would like to know if he could have those removed before 08/09/20, Please Advise

## 2020-08-04 NOTE — Telephone Encounter (Signed)
Patient has requested refill on all meds, Please Advise

## 2020-08-04 NOTE — Telephone Encounter (Signed)
I spoke with him, advised to take medication as directed, ice and elevate as much as possible, no weightbearing on the extremity, pins will be removed at 5 weeks and he can loosen the Ace wraps for comfort

## 2020-08-09 ENCOUNTER — Encounter: Payer: Self-pay | Admitting: Podiatry

## 2020-08-09 ENCOUNTER — Encounter: Payer: Medicaid Other | Admitting: Podiatry

## 2020-08-09 ENCOUNTER — Other Ambulatory Visit: Payer: Self-pay

## 2020-08-09 ENCOUNTER — Ambulatory Visit (INDEPENDENT_AMBULATORY_CARE_PROVIDER_SITE_OTHER): Payer: Medicaid Other | Admitting: Podiatry

## 2020-08-09 ENCOUNTER — Ambulatory Visit (INDEPENDENT_AMBULATORY_CARE_PROVIDER_SITE_OTHER): Payer: Medicaid Other

## 2020-08-09 DIAGNOSIS — M21612 Bunion of left foot: Secondary | ICD-10-CM | POA: Diagnosis not present

## 2020-08-09 DIAGNOSIS — M2012 Hallux valgus (acquired), left foot: Secondary | ICD-10-CM | POA: Diagnosis not present

## 2020-08-09 DIAGNOSIS — M2042 Other hammer toe(s) (acquired), left foot: Secondary | ICD-10-CM

## 2020-08-09 DIAGNOSIS — Z9889 Other specified postprocedural states: Secondary | ICD-10-CM

## 2020-08-09 DIAGNOSIS — M205X2 Other deformities of toe(s) (acquired), left foot: Secondary | ICD-10-CM

## 2020-08-09 MED ORDER — IBUPROFEN 600 MG PO TABS
600.0000 mg | ORAL_TABLET | Freq: Three times a day (TID) | ORAL | 0 refills | Status: AC | PRN
Start: 2020-08-09 — End: 2020-08-23

## 2020-08-09 MED ORDER — OXYCODONE HCL 5 MG PO TABS: 5.0000 mg | ORAL_TABLET | Freq: Four times a day (QID) | ORAL | 0 refills | Status: AC | PRN

## 2020-08-09 NOTE — Progress Notes (Signed)
  Subjective:  Patient ID: Caleb Hunter, male    DOB: Nov 26, 1965,  MRN: [id number]  Chief Complaint  Patient presents with  . Routine Post Op    PT stated that he is doing well he stated that the pain comes and goes and he has no major concerns at this time    DOS: 08/02/20 Procedure: left foot Lapidus bunionectomy, hammertoe correction 2,3, mallet toe correction 4  55 y.o. male returns for post-op check. Feeling well, having to take narcotic pain medication fairly regularly.   Review of Systems: Negative except as noted in the HPI. Denies N/V/F/Ch.   Objective:  There were no vitals filed for this visit. There is no height or weight on file to calculate BMI. Constitutional Well developed. Well nourished.  Vascular Foot warm and well perfused. Capillary refill normal to all digits.   Neurologic Normal speech. Oriented to person, place, and time. Epicritic sensation to light touch grossly present bilaterally.  Dermatologic Skin healing well without signs of infection. Skin edges well coapted without signs of infection.  Orthopedic: Tenderness to palpation noted about the surgical site. Moderate edema, K-wires intact   Radiographs: consistent post operative alignment, hardware in good position  Assessment:   1. Hallux valgus with bunions, left   2. Hammertoe of left foot   3. Mallet toe of left foot   4. Post-operative state    Plan:  Patient was evaluated and treated and all questions answered.  S/p foot surgery left -Progressing as expected post-operatively. -XR: c/w post op changes -WB Status: NWB in CAM boot with knee scooter -Sutures: will remove next week. -Medications: refilled ibuprofen and oxycodone -Foot redressed.  No follow-ups on file.

## 2020-08-10 ENCOUNTER — Encounter (HOSPITAL_BASED_OUTPATIENT_CLINIC_OR_DEPARTMENT_OTHER): Payer: Self-pay | Admitting: Podiatry

## 2020-08-11 ENCOUNTER — Telehealth: Payer: Self-pay | Admitting: Podiatry

## 2020-08-11 NOTE — Telephone Encounter (Signed)
He can remove the ACE wrap and re-apply with it looser and see how he does, don't remove any gauze. He should keep icing and elevating 15 minutes on and 15 minutes off consistently too

## 2020-08-11 NOTE — Telephone Encounter (Signed)
Patient called in stating the bandages are too tight, unable to sleep and would like to know if he could remove bandages or would he have to come in for you to re-wrap. Patient stated he is afraid of messing something up, Please Advise

## 2020-08-16 ENCOUNTER — Other Ambulatory Visit: Payer: Self-pay

## 2020-08-16 ENCOUNTER — Ambulatory Visit (INDEPENDENT_AMBULATORY_CARE_PROVIDER_SITE_OTHER): Payer: Medicaid Other | Admitting: Podiatry

## 2020-08-16 DIAGNOSIS — M2042 Other hammer toe(s) (acquired), left foot: Secondary | ICD-10-CM

## 2020-08-16 DIAGNOSIS — M21612 Bunion of left foot: Secondary | ICD-10-CM

## 2020-08-16 DIAGNOSIS — Z9889 Other specified postprocedural states: Secondary | ICD-10-CM

## 2020-08-16 DIAGNOSIS — M205X2 Other deformities of toe(s) (acquired), left foot: Secondary | ICD-10-CM

## 2020-08-16 DIAGNOSIS — M2012 Hallux valgus (acquired), left foot: Secondary | ICD-10-CM

## 2020-08-17 ENCOUNTER — Encounter: Payer: Self-pay | Admitting: Podiatry

## 2020-08-17 NOTE — Progress Notes (Signed)
  Subjective:  Patient ID: Caleb Hunter, male    DOB: 09/04/1965,  MRN: [id number]  Chief Complaint  Patient presents with  . Routine Post Op    PT stated that he is still having some pain and stiffness with that foot     DOS: 08/02/20 Procedure: left foot Lapidus bunionectomy, hammertoe correction 2,3, mallet toe correction 4  55 y.o. male returns for post-op check. Feeling well, his pain has been improving significantly although he still very swollen and feels throbbing.  Review of Systems: Negative except as noted in the HPI. Denies N/V/F/Ch.   Objective:  There were no vitals filed for this visit. There is no height or weight on file to calculate BMI. Constitutional Well developed. Well nourished.  Vascular Foot warm and well perfused. Capillary refill normal to all digits.   Neurologic Normal speech. Oriented to person, place, and time. Epicritic sensation to light touch grossly present bilaterally.  Dermatologic Skin healing well without signs of infection. Skin edges well coapted without signs of infection.  Orthopedic: Tenderness to palpation noted about the surgical site. Moderate edema, K-wires intact   Radiographs: consistent post operative alignment, hardware in good position  Assessment:   1. Hallux valgus with bunions, left   2. Hammertoe of left foot   3. Mallet toe of left foot   4. Post-operative state    Plan:  Patient was evaluated and treated and all questions answered.  S/p foot surgery left -Progressing as expected post-operatively. -XR: c/w post op changes -WB Status: May partial WB in CAM boot only to heel for minimal ambulation around the house, use knee scooter for longer distances -Sutures: Removed today, Steri-Strips were applied he may begin bathing and apply antibiotic ointment around the pin sites.  No soaking or scrubbing incisions -Medications: refilled ibuprofen and oxycodone -Ace wrap applied for edema  Return in about 3 weeks (around  09/06/2020) for pin removal, needs x-rays.

## 2020-08-23 ENCOUNTER — Encounter: Payer: Medicaid Other | Admitting: Podiatry

## 2020-08-30 ENCOUNTER — Encounter: Payer: Medicaid Other | Admitting: Podiatry

## 2020-09-05 ENCOUNTER — Other Ambulatory Visit: Payer: Self-pay

## 2020-09-05 ENCOUNTER — Ambulatory Visit (INDEPENDENT_AMBULATORY_CARE_PROVIDER_SITE_OTHER): Payer: Medicaid Other | Admitting: Podiatry

## 2020-09-05 ENCOUNTER — Ambulatory Visit (INDEPENDENT_AMBULATORY_CARE_PROVIDER_SITE_OTHER): Payer: Medicaid Other

## 2020-09-05 ENCOUNTER — Encounter: Payer: Self-pay | Admitting: Podiatry

## 2020-09-05 DIAGNOSIS — R2689 Other abnormalities of gait and mobility: Secondary | ICD-10-CM

## 2020-09-05 DIAGNOSIS — M2012 Hallux valgus (acquired), left foot: Secondary | ICD-10-CM

## 2020-09-05 DIAGNOSIS — M21612 Bunion of left foot: Secondary | ICD-10-CM

## 2020-09-05 MED ORDER — OXYCODONE HCL 5 MG PO TABS: 5.0000 mg | ORAL_TABLET | Freq: Three times a day (TID) | ORAL | 0 refills | Status: AC | PRN

## 2020-09-05 MED ORDER — IBUPROFEN 800 MG PO TABS: 800.0000 mg | ORAL_TABLET | Freq: Three times a day (TID) | ORAL | 0 refills | Status: AC | PRN

## 2020-09-05 NOTE — Progress Notes (Signed)
  Subjective:  Patient ID: Caleb Hunter, male    DOB: [birth year],  MRN: [id number]  Chief Complaint  Patient presents with  . Routine Post Op    Pt stated that he is doing okay he does have some pain but stated that it is mainly in that 4th toe. He stated that he would like some more pain meds.    DOS: 08/02/20 Procedure: left foot Lapidus bunionectomy, hammertoe correction 2,3, mallet toe correction 4  55 y.o. male returns for post-op check. Feeling well, still having some pain requiring narcotics.  Review of Systems: Negative except as noted in the HPI. Denies N/V/F/Ch.   Objective:  There were no vitals filed for this visit. There is no height or weight on file to calculate BMI. Constitutional Well developed. Well nourished.  Vascular Foot warm and well perfused. Capillary refill normal to all digits.   Neurologic Normal speech. Oriented to person, place, and time. Epicritic sensation to light touch grossly present bilaterally.  Dermatologic Skin healing well without signs of infection. Skin edges well coapted without signs of infection.  Orthopedic: Tenderness to palpation noted about the surgical site. Moderate edema, K-wires intact   Radiographs: consistent post operative alignment, hardware in good position, there is good bony bridging across the arthrodesis site of the Lapidus, second and third toes, arthroplasty of the fourth Assessment:   1. Hallux valgus with bunions, left   2. Inability to bear weight    Plan:  Patient was evaluated and treated and all questions answered.  S/p foot surgery left -Progressing as expected post-operatively. -XR: Noted as above -WB Status: May begin full weightbearing in the CAM boot -Today the Kirschner wires removed without incident and a sterile adhesive bandage applied. He may continue regular bathing. -Medications: refilled ibuprofen and oxycodone -Compression sleeve and silicone toe spacers given he should continue to wear  this  Return in about 3 weeks (around 09/26/2020) for new x-rays next visit, move to shoe.

## 2020-09-09 ENCOUNTER — Other Ambulatory Visit: Payer: Self-pay | Admitting: Podiatry

## 2020-09-09 NOTE — Telephone Encounter (Signed)
Please advise. Thank you

## 2020-09-27 ENCOUNTER — Other Ambulatory Visit: Payer: Self-pay

## 2020-09-27 ENCOUNTER — Ambulatory Visit (INDEPENDENT_AMBULATORY_CARE_PROVIDER_SITE_OTHER): Payer: Medicaid Other | Admitting: Podiatry

## 2020-09-27 ENCOUNTER — Ambulatory Visit (INDEPENDENT_AMBULATORY_CARE_PROVIDER_SITE_OTHER): Payer: Medicaid Other

## 2020-09-27 DIAGNOSIS — M205X2 Other deformities of toe(s) (acquired), left foot: Secondary | ICD-10-CM

## 2020-09-27 DIAGNOSIS — Z9889 Other specified postprocedural states: Secondary | ICD-10-CM | POA: Diagnosis not present

## 2020-09-27 DIAGNOSIS — M2012 Hallux valgus (acquired), left foot: Secondary | ICD-10-CM | POA: Diagnosis not present

## 2020-09-27 DIAGNOSIS — M21612 Bunion of left foot: Secondary | ICD-10-CM

## 2020-09-27 DIAGNOSIS — M2042 Other hammer toe(s) (acquired), left foot: Secondary | ICD-10-CM

## 2020-09-27 NOTE — Progress Notes (Signed)
  Subjective:  Patient ID: Caleb Hunter, male    DOB: 1966-06-25,  MRN: [id number]  Chief Complaint  Patient presents with  . Routine Post Op    PT stated that he is doing a lot better he stated that he still has some pain but it comes and goes     DOS: 08/02/20 Procedure: left foot Lapidus bunionectomy, hammertoe correction 2,3, mallet toe correction 4  55 y.o. male returns for post-op check. Feeling well, his pain has improved significantly. Has been WB in the boot.  Review of Systems: Negative except as noted in the HPI. Denies N/V/F/Ch.   Objective:  There were no vitals filed for this visit. There is no height or weight on file to calculate BMI. Constitutional Well developed. Well nourished.  Vascular Foot warm and well perfused. Capillary refill normal to all digits.   Neurologic Normal speech. Oriented to person, place, and time. Epicritic sensation to light touch grossly present bilaterally.  Dermatologic Well healed incisions non hypertrophic  Orthopedic: Minimal tenderness, mild edema.   Radiographs: consistent post operative alignment, hardware in good position, good healing of arthrodesis sites Assessment:   1. Post-operative state   2. Hallux valgus with bunions, left   3. Hammertoe of left foot   4. Mallet toe of left foot    Plan:  Patient was evaluated and treated and all questions answered.  S/p foot surgery left -Progressing as expected post-operatively. -XR: good osseous healing noted -WB Status: May begin full weightbearing in supportive shoe gear -At next visit take new x-rays on both feet and we will plan for surgery in late May for the right foot -He had an issue with his pain management doctor at [facility] who was concerned about him receiving narcotics from me.  I discussed with him that I do not see any reason he should not continue with his current pain management regimen.  He no longer requires acute pain medications for  postoperative pain.  He has been very consistent with his use of narcotics and I have had not any sort of suspicious drug-seeking behavior from him.  I will reach out to Dr. Earney Navy at [facility] and discussed with him.  We will have to plan a preoperative pain regimen for his postoperative phase for the right foot when we plan to do it in late May.  Return in about 5 weeks (around 11/01/2020) for new x-rays on both feet, plan right foot surgery .

## 2020-11-01 ENCOUNTER — Encounter: Payer: Self-pay | Admitting: Podiatry

## 2020-11-01 ENCOUNTER — Ambulatory Visit (INDEPENDENT_AMBULATORY_CARE_PROVIDER_SITE_OTHER): Payer: Medicaid Other

## 2020-11-01 ENCOUNTER — Other Ambulatory Visit: Payer: Self-pay

## 2020-11-01 ENCOUNTER — Ambulatory Visit (INDEPENDENT_AMBULATORY_CARE_PROVIDER_SITE_OTHER): Payer: Medicaid Other | Admitting: Podiatry

## 2020-11-01 DIAGNOSIS — M2011 Hallux valgus (acquired), right foot: Secondary | ICD-10-CM

## 2020-11-01 DIAGNOSIS — M2012 Hallux valgus (acquired), left foot: Secondary | ICD-10-CM

## 2020-11-01 DIAGNOSIS — M2042 Other hammer toe(s) (acquired), left foot: Secondary | ICD-10-CM

## 2020-11-01 DIAGNOSIS — Q66221 Congenital metatarsus adductus, right foot: Secondary | ICD-10-CM

## 2020-11-01 DIAGNOSIS — M2041 Other hammer toe(s) (acquired), right foot: Secondary | ICD-10-CM | POA: Diagnosis not present

## 2020-11-01 DIAGNOSIS — M21612 Bunion of left foot: Secondary | ICD-10-CM

## 2020-11-01 DIAGNOSIS — M21611 Bunion of right foot: Secondary | ICD-10-CM

## 2020-11-01 DIAGNOSIS — Q66222 Congenital metatarsus adductus, left foot: Secondary | ICD-10-CM

## 2020-11-01 MED ORDER — IBUPROFEN 800 MG PO TABS: 800.0000 mg | ORAL_TABLET | Freq: Three times a day (TID) | ORAL | 2 refills | Status: DC | PRN

## 2020-11-01 NOTE — Progress Notes (Signed)
  Subjective:  Patient ID: Caleb Hunter, male    DOB: 1966-06-26,  MRN: [id number]  Chief Complaint  Patient presents with  . Bunions    DOS: 08/02/20 Procedure: left foot Lapidus bunionectomy, hammertoe correction 2,3, mallet toe correction 4  55 y.o. male returns for post-op check.  Currently having a gout attack on the left foot he has a prescription for colchicine he has not picked up yet he is concerned that the toe is somewhat short on the left foot and wants to know what we will be doing for the right foot  Review of Systems: Negative except as noted in the HPI. Denies N/V/F/Ch.   Objective:  There were no vitals filed for this visit. There is no height or weight on file to calculate BMI. Constitutional Well developed. Well nourished.  Vascular Foot warm and well perfused. Capillary refill normal to all digits.   Neurologic Normal speech. Oriented to person, place, and time. Epicritic sensation to light touch grossly present bilaterally.  Dermatologic Well healed incisions non hypertrophic  Orthopedic:  Moderate edema, he still has hallux abduction improved since preop, his right foot has severe hallux valgus and hammertoes 2 3   Radiographs: Arthrodesis site left foot is well-healed, he does have some residual hallux abductus deformity, right foot severe hallux valgus with hammertoe deformities 2 and 3 Assessment:   1. Hallux valgus with bunions, left   2. Hallux valgus with bunions, right    Plan:  Patient was evaluated and treated and all questions answered.  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  I discussed with him that the only way to get the toe perfectly straight would be due for a first metatarsophalangeal joint fusion.  He said he would be interested in doing this eventually on the left foot  and this is likely what he would want to on the right foot.  He would like to wait for surgery until October at this point.  I will see him back in August for a presurgical planning visit plan for arthrodesis of the first MTPJ with bone graft from calcaneus and internal hammertoe implants on the right foot, he would not like to have any more Kirschner wire sticking out of the toes.  I refilled his ibuprofen 800 mg, I also had discussed his opioid medications previously with his pain management doctor and this has been sorted out for him.  Return in about 14 weeks (around 02/07/2021).

## 2020-11-15 ENCOUNTER — Encounter (HOSPITAL_BASED_OUTPATIENT_CLINIC_OR_DEPARTMENT_OTHER): Payer: Self-pay | Admitting: *Deleted

## 2020-11-15 ENCOUNTER — Emergency Department (HOSPITAL_BASED_OUTPATIENT_CLINIC_OR_DEPARTMENT_OTHER)
Admission: EM | Admit: 2020-11-15 | Discharge: 2020-11-15 | Disposition: A | Discharge status: Home / Self Care | Payer: Medicaid Other | Attending: Emergency Medicine | Admitting: Emergency Medicine

## 2020-11-15 ENCOUNTER — Emergency Department (HOSPITAL_BASED_OUTPATIENT_CLINIC_OR_DEPARTMENT_OTHER): Payer: Medicaid Other

## 2020-11-15 ENCOUNTER — Other Ambulatory Visit: Payer: Self-pay

## 2020-11-15 DIAGNOSIS — M25561 Pain in right knee: Secondary | ICD-10-CM | POA: Insufficient documentation

## 2020-11-15 DIAGNOSIS — Z7951 Long term (current) use of inhaled steroids: Secondary | ICD-10-CM | POA: Diagnosis not present

## 2020-11-15 DIAGNOSIS — Z79899 Other long term (current) drug therapy: Secondary | ICD-10-CM | POA: Insufficient documentation

## 2020-11-15 DIAGNOSIS — Z7984 Long term (current) use of oral hypoglycemic drugs: Secondary | ICD-10-CM | POA: Insufficient documentation

## 2020-11-15 DIAGNOSIS — I10 Essential (primary) hypertension: Secondary | ICD-10-CM | POA: Insufficient documentation

## 2020-11-15 DIAGNOSIS — J4540 Moderate persistent asthma, uncomplicated: Secondary | ICD-10-CM | POA: Diagnosis not present

## 2020-11-15 DIAGNOSIS — Z7982 Long term (current) use of aspirin: Secondary | ICD-10-CM | POA: Insufficient documentation

## 2020-11-15 IMAGING — DX DG KNEE COMPLETE 4+V*R*
4 series · 4 of 4 positions shown · non-contrast
Comparison: None.

CLINICAL DATA: Knee pain

EXAM:
RIGHT KNEE - COMPLETE 4+ VIEW

[knee ap]
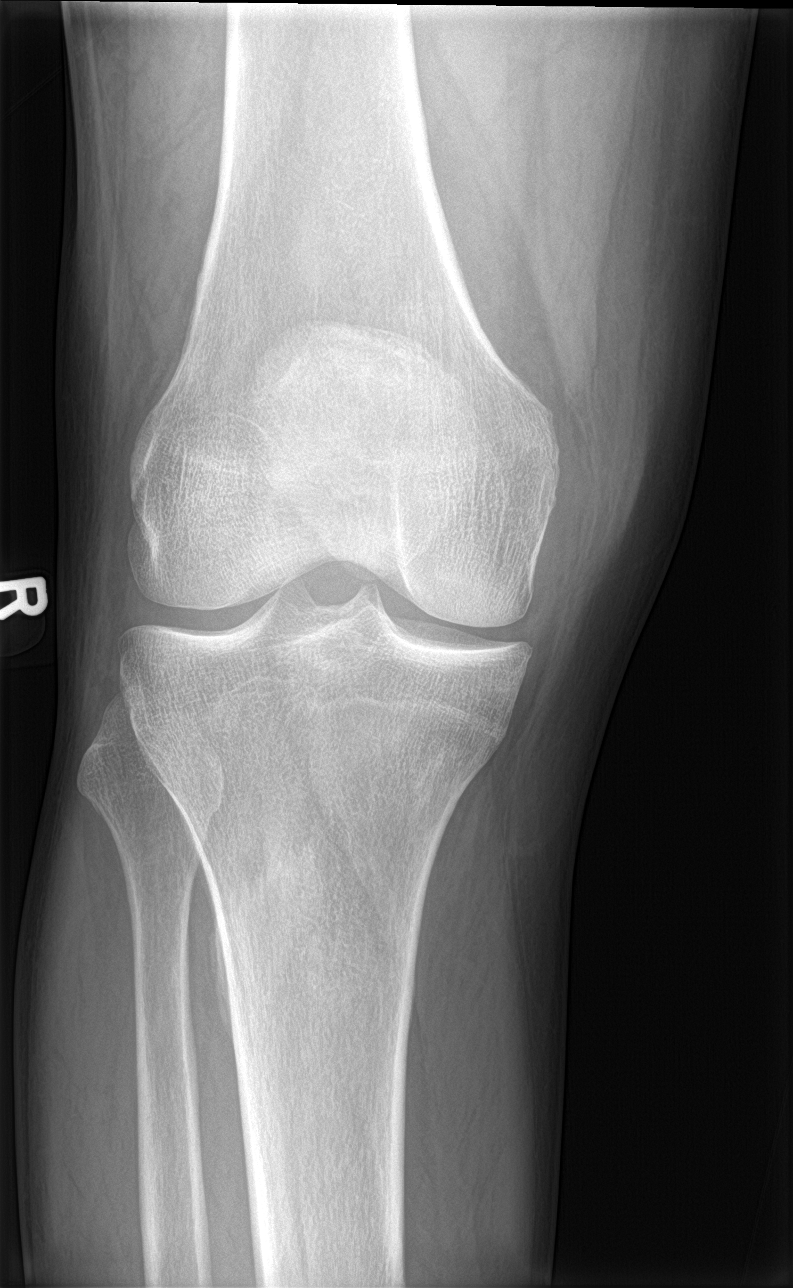

[knee lat]
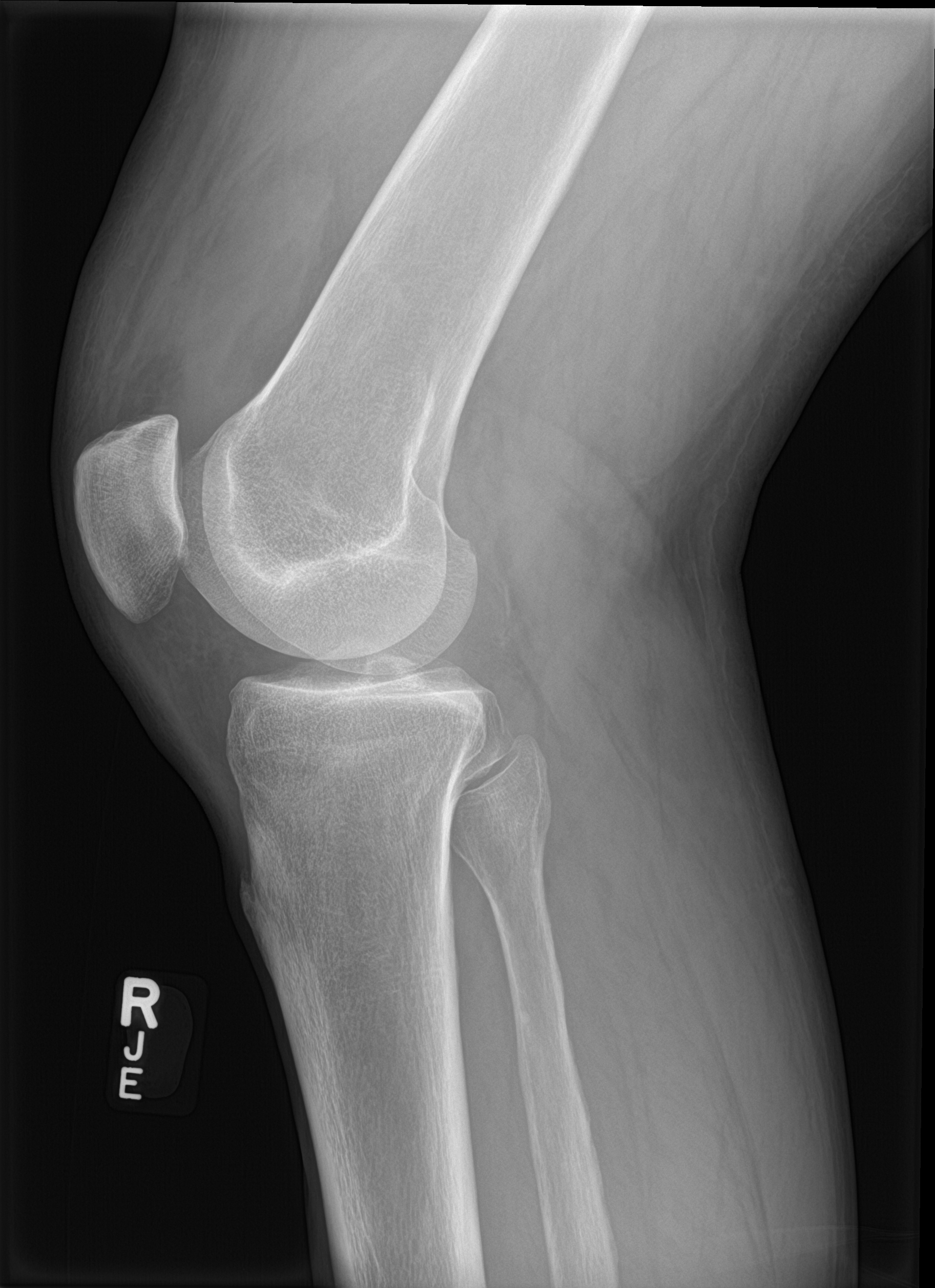

[knee obl (1 of 2)]
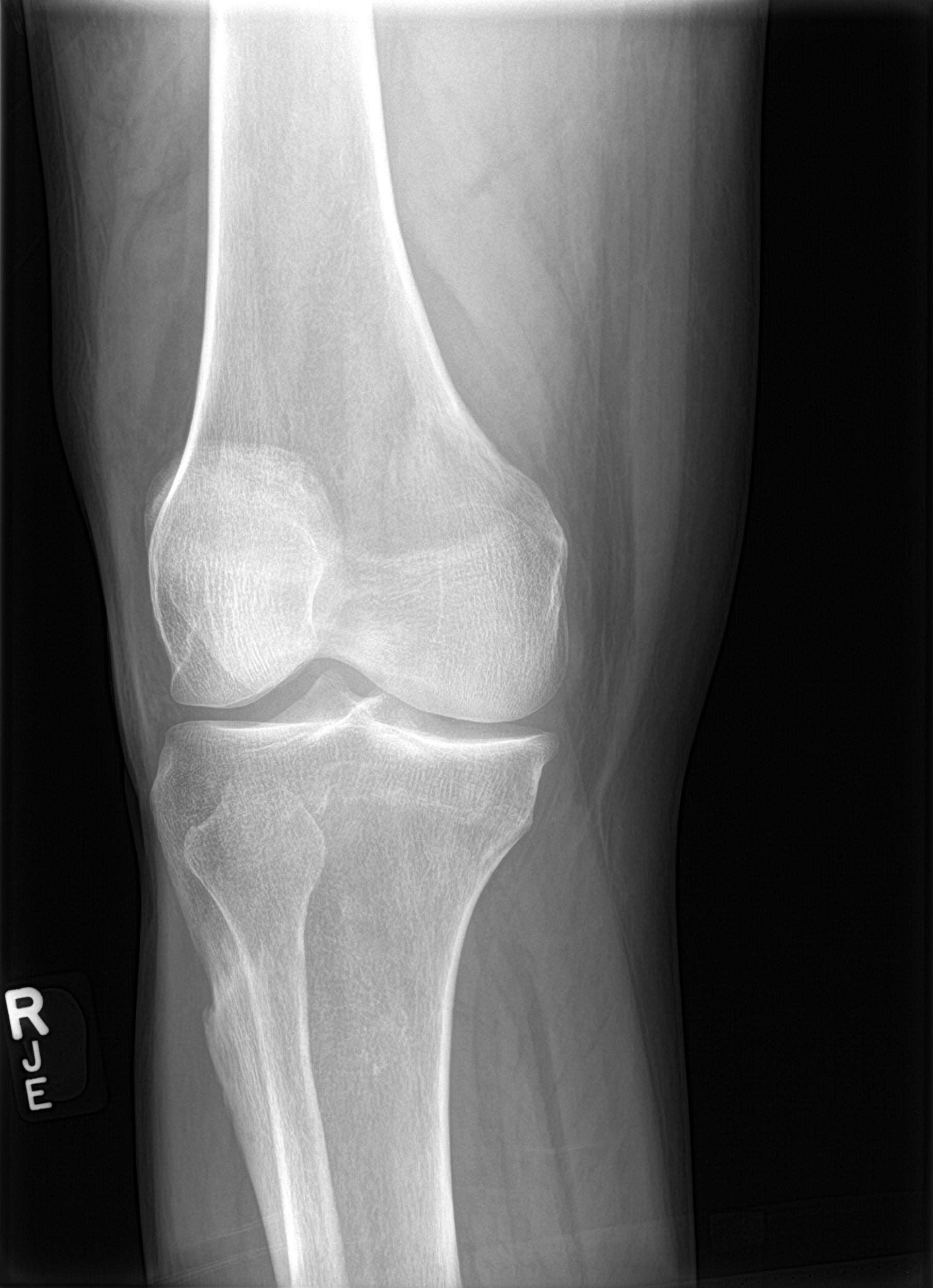

[knee obl (2 of 2)]
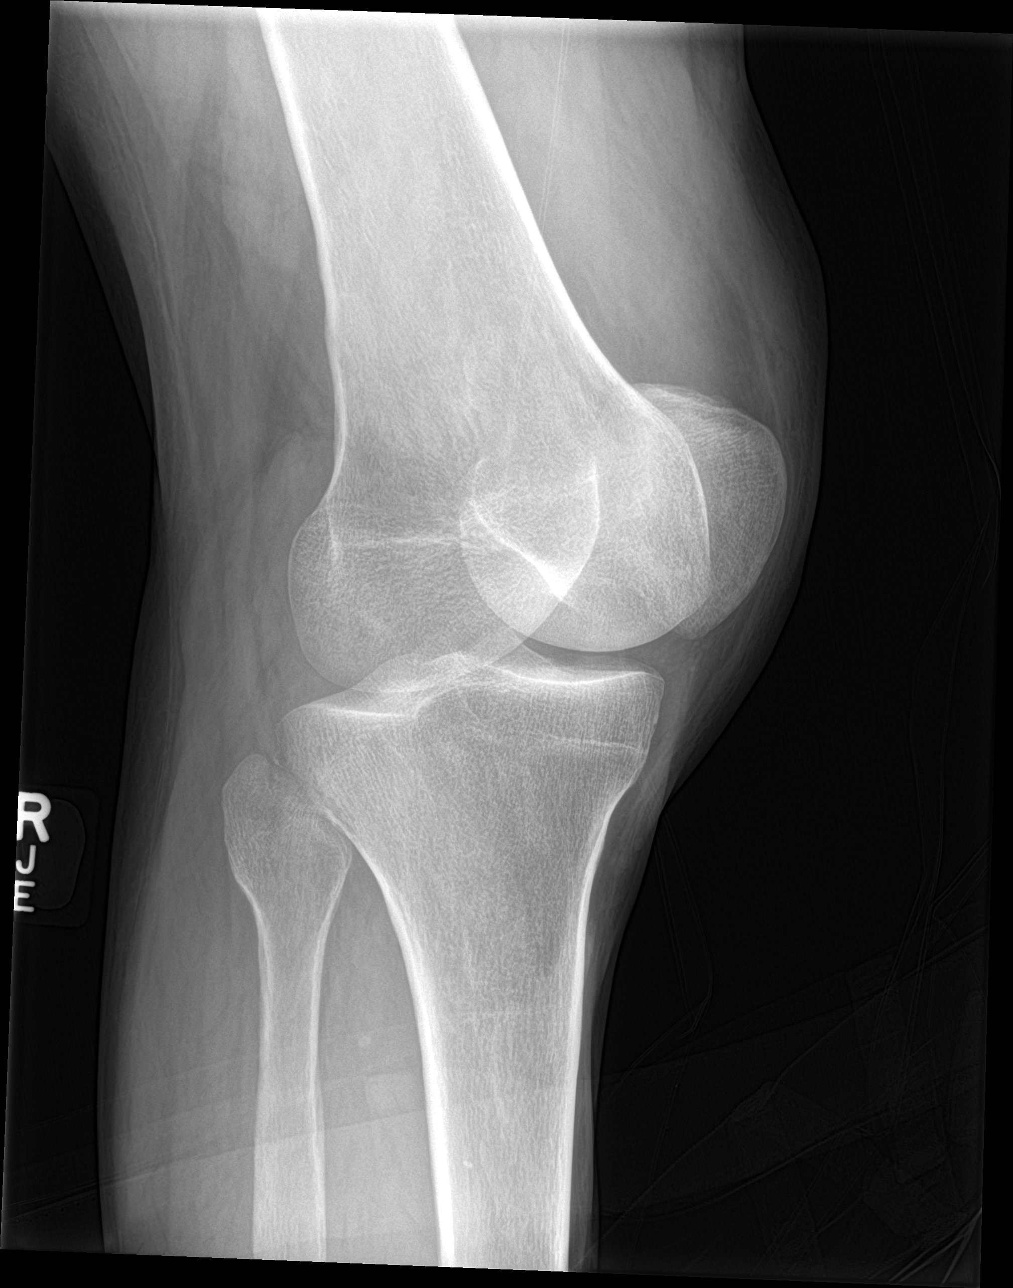

[4 of 4 positions shown; findings below may reference images not displayed]

FINDINGS: No evidence of fracture, dislocation,. No evidence of arthropathy or
other focal bone abnormality. Soft tissues are unremarkable.Moderate
knee effusion.
IMPRESSION: No acute osseous abnormality.  Moderate knee effusion.

## 2020-11-15 MED ORDER — ACETAMINOPHEN 500 MG PO TABS: 1000.0000 mg | ORAL_TABLET | Freq: Four times a day (QID) | ORAL | 0 refills | Status: DC | PRN

## 2020-11-15 MED ORDER — OXYCODONE HCL 5 MG PO TABS: 5.0000 mg | ORAL_TABLET | Freq: Three times a day (TID) | ORAL | 0 refills | Status: AC | PRN

## 2020-11-15 MED ORDER — ACETAMINOPHEN 500 MG PO TABS
1000.0000 mg | ORAL_TABLET | Freq: Once | ORAL | Status: AC
  Administered 2020-11-15: 1000 mg via ORAL
  Filled 2020-11-15: qty 2

## 2020-11-15 MED ORDER — PREDNISONE 10 MG (21) PO TBPK: ORAL_TABLET | Freq: Every day | ORAL | 0 refills | Status: DC

## 2020-11-15 NOTE — ED Triage Notes (Signed)
C/o right knee swelling redness pain x 1 day , denies injury hx gout
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
## 2020-11-15 NOTE — Discharge Instructions (Addendum)
Please rest ice and elevate your right knee. I am prescribing you a few tablets of roxicodone (oxycodone) as well as recommended that you take 1000 of Tylenol every 6 hours for pain.  Drink plenty of water.  Use the knee sleeve.  Follow-up with your primary care provider.  Return to ER for any new or concerning symptoms especially fevers.

## 2020-11-15 NOTE — ED Notes (Signed)
Patient transported to X-ray 

## 2020-11-28 ENCOUNTER — Encounter: Payer: Self-pay | Admitting: Podiatry

## 2020-11-28 ENCOUNTER — Ambulatory Visit: Payer: Medicaid Other | Admitting: Podiatry

## 2020-11-28 ENCOUNTER — Other Ambulatory Visit: Payer: Self-pay

## 2020-11-28 ENCOUNTER — Ambulatory Visit (INDEPENDENT_AMBULATORY_CARE_PROVIDER_SITE_OTHER): Payer: Medicaid Other

## 2020-11-28 DIAGNOSIS — M2012 Hallux valgus (acquired), left foot: Secondary | ICD-10-CM

## 2020-11-28 DIAGNOSIS — M21612 Bunion of left foot: Secondary | ICD-10-CM

## 2020-11-28 DIAGNOSIS — M2042 Other hammer toe(s) (acquired), left foot: Secondary | ICD-10-CM

## 2020-11-28 MED ORDER — IBUPROFEN 800 MG PO TABS: 800.0000 mg | ORAL_TABLET | Freq: Three times a day (TID) | ORAL | 2 refills | Status: DC | PRN

## 2020-11-28 NOTE — Addendum Note (Signed)
Addended byLilian Kapur, Sandor Arboleda R on: 11/28/2020 12:30 PM   Modules accepted: Orders

## 2020-11-28 NOTE — Progress Notes (Addendum)
  Subjective:  Patient ID: Caleb Hunter, male    DOB: 1965-08-18,  MRN: [id number]  Chief Complaint  Patient presents with  . Bunions    discuss complications with previous surgical area    DOS: 08/02/20 Procedure: left foot Lapidus bunionectomy, hammertoe correction 2,3, mallet toe correction 4  55 y.o. male returns for post-op check.  Has been having a lot of stiffness and swelling in the left foot Review of Systems: Negative except as noted in the HPI. Denies N/V/F/Ch.   Objective:  There were no vitals filed for this visit. There is no height or weight on file to calculate BMI. Constitutional Well developed. Well nourished.  Vascular Foot warm and well perfused. Capillary refill normal to all digits.   Neurologic Normal speech. Oriented to person, place, and time. Epicritic sensation to light touch grossly present bilaterally.  Dermatologic Well healed incisions non hypertrophic  Orthopedic:  Moderate edema, he still has hallux abduction improved since preop, his right foot has severe hallux valgus and hammertoes 2 3, he has mild diffuse pain in the midfoot and around the metatarsal phalangeal joint area   Radiographs: Arthrodesis site left foot is well-healed, he does have some residual hallux abductus deformity, right foot severe hallux valgus with hammertoe deformities 2 and 3 there is lucency around the screw tract of the metatarsal 1-2 screw Assessment:   1. Hallux valgus with bunions, left   2. Hammertoe of left foot    Plan:  Patient was evaluated and treated and all questions answered.  S/p foot surgery left -Reviewed the radiographs with him.  I discussed with him that the 1-2 screw may be irritating him and causing pain.  He also has diffuse stiffness in the toes.  He did have a gout attack in his foot previously.  I think some physical therapy would help to improve his range of motion and overall function and strength.  I sent a referral for con physical therapy  for him.  Discussed with him that if he has not improved by the next visit then we may should consider removal of the 1-2 metatarsal and the intercuneiform screw, consider removal of all hardware as well.  I refilled his ibuprofen 800 as well  Return in about 5 weeks (around 01/02/2021) for re-check left foot.

## 2020-12-13 ENCOUNTER — Ambulatory Visit: Payer: Medicaid Other | Admitting: Rehabilitative and Restorative Service Providers"

## 2020-12-29 ENCOUNTER — Ambulatory Visit (INDEPENDENT_AMBULATORY_CARE_PROVIDER_SITE_OTHER): Payer: Medicaid Other | Admitting: Podiatry

## 2020-12-29 ENCOUNTER — Other Ambulatory Visit: Payer: Self-pay

## 2020-12-29 DIAGNOSIS — M216X1 Other acquired deformities of right foot: Secondary | ICD-10-CM | POA: Diagnosis not present

## 2020-12-29 DIAGNOSIS — T8484XA Pain due to internal orthopedic prosthetic devices, implants and grafts, initial encounter: Secondary | ICD-10-CM

## 2021-01-02 ENCOUNTER — Encounter: Payer: Self-pay | Admitting: Podiatry

## 2021-01-02 NOTE — Progress Notes (Signed)
  Subjective:  Patient ID: Caleb Hunter, male    DOB: 03-09-1966,  MRN: [id number]  Chief Complaint  Patient presents with   SURGERY CONSULT    Pt states he wants to have a consultation about having metal removed in toe.     DOS: 08/02/20 Procedure: left foot Lapidus bunionectomy, hammertoe correction 2,3, mallet toe correction 4  55 y.o. male returns for post-op check.  Still having same level of stiffness and swelling left foot Review of Systems: Negative except as noted in the HPI. Denies N/V/F/Ch.   Objective:  There were no vitals filed for this visit. There is no height or weight on file to calculate BMI. Constitutional Well developed. Well nourished.  Vascular Foot warm and well perfused. Capillary refill normal to all digits.   Neurologic Normal speech. Oriented to person, place, and time. Epicritic sensation to light touch grossly present bilaterally.  Dermatologic Well healed incisions non hypertrophic  Orthopedic: Mild edema, he still has hallux abduction improved since preop, his right foot has severe hallux valgus and hammertoes 2 3, he has mild diffuse pain in the midfoot and around the metatarsal phalangeal joint area, he has pain over the plate   Radiographs: Arthrodesis site left foot is well-healed, he does have some residual hallux abductus deformity, right foot severe hallux valgus with hammertoe deformities 2 and 3 there is lucency around the screw tract of the metatarsal 1-2 screw Assessment:   1. Pain due to internal orthopedic prosthetic devices, implants and grafts, initial encounter [facility])    Plan:  Patient was evaluated and treated and all questions answered.  S/p foot surgery left -We discussed his current symptoms and he would like to have the hardware removed to see if this improves his pain and stiffness.  I think this would be advisable, I think a good amount of the pain he is having is from the intermetatarsal screw.  We discussed the risks and  benefits of this including wound healing problems, infection, pain and neurovascular injury.  Informed consent was signed and reviewed.  He would like to wait until February for the right foot correction.   Surgical plan:  Procedure: -Removal of all hardware left foot  Location: -[facility]  Anesthesia plan: -IV General and local  Postoperative pain plan: - Tylenol 1000 mg every 6 hours, ibuprofen 600 mg every 6 hours, gabapentin 300 mg every 8 hours x5 days, oxycodone 5 mg 1-2 tabs every 6 hours only as needed  DVT prophylaxis: -None required  WB Restrictions / DME needs: -WBAT in CAM boot he already has 1   No follow-ups on file.

## 2021-01-30 ENCOUNTER — Telehealth: Payer: Self-pay | Admitting: Urology

## 2021-01-30 NOTE — Telephone Encounter (Signed)
PT CALLED TO RESCHEDULE HIS SX FROM 02/10/21 TO 02/17/21 DUE TO NOT HAVING A RIDE, WIFE'S WORK IS SENDING HER OUT OF TOWN THAT WEEK FOR WORK. I HAVE INFORMED DR. MCDONALD OF THE CHANGE AND CALLED CYNTHIA WITH [facility].

## 2021-02-06 ENCOUNTER — Ambulatory Visit: Payer: Medicaid Other | Admitting: Podiatry

## 2021-02-07 ENCOUNTER — Ambulatory Visit: Payer: Medicaid Other | Admitting: Podiatry

## 2021-02-07 ENCOUNTER — Other Ambulatory Visit: Payer: Self-pay

## 2021-02-07 DIAGNOSIS — T8484XA Pain due to internal orthopedic prosthetic devices, implants and grafts, initial encounter: Secondary | ICD-10-CM | POA: Diagnosis not present

## 2021-02-07 DIAGNOSIS — M21612 Bunion of left foot: Secondary | ICD-10-CM

## 2021-02-07 DIAGNOSIS — M2042 Other hammer toe(s) (acquired), left foot: Secondary | ICD-10-CM

## 2021-02-07 DIAGNOSIS — M2012 Hallux valgus (acquired), left foot: Secondary | ICD-10-CM | POA: Diagnosis not present

## 2021-02-08 NOTE — Progress Notes (Signed)
  Subjective:  Patient ID: Caleb Hunter, male    DOB: 04-Apr-1966,  MRN: [id number]  Chief Complaint  Patient presents with   Consult    Consult for  surgery       55 y.o. male returns for presurgery visit and still having same level of stiffness and swelling left foot  Review of Systems: Negative except as noted in the HPI. Denies N/V/F/Ch.   Objective:  There were no vitals filed for this visit. There is no height or weight on file to calculate BMI. Constitutional Well developed. Well nourished.  Vascular Foot warm and well perfused. Capillary refill normal to all digits.   Neurologic Normal speech. Oriented to person, place, and time. Epicritic sensation to light touch grossly present bilaterally.  Dermatologic Well healed incisions non hypertrophic  Orthopedic: Mild edema, he still has hallux abduction improved since preop, his right foot has severe hallux valgus and hammertoes 2 3, he has mild diffuse pain in the midfoot and around the metatarsal phalangeal joint area, he has pain over the plate   Radiographs: Arthrodesis site left foot is well-healed, he does have some residual hallux abductus deformity, right foot severe hallux valgus with hammertoe deformities 2 and 3 there is lucency around the screw tract of the metatarsal 1-2 screw Assessment:   1. Pain due to internal orthopedic prosthetic devices, implants and grafts, initial encounter (HCC)   2. Hallux valgus with bunions, left   3. Hammertoe of left foot    Plan:  Patient was evaluated and treated and all questions answered.  S/p foot surgery left -We again discussed other options including revision of his recurrent bunion on the left foot.  We discussed revision Lapidus versus first metatarsophalangeal joint arthrodesis.  I do think a significant amount of his pain is from the current internal hardware.  We will plan for staged removal of the hardware and then first MPJ fusion later this fall or  winter   Surgical plan:  Procedure: -Removal of all hardware left foot  Location: -[facility]  Anesthesia plan: -IV General and local  Postoperative pain plan: - Tylenol 1000 mg every 6 hours, ibuprofen 600 mg every 6 hours, gabapentin 300 mg every 8 hours x5 days, oxycodone 5 mg 1-2 tabs every 6 hours only as needed  DVT prophylaxis: -None required  WB Restrictions / DME needs: -WBAT in CAM boot he already has 1   Return if symptoms worsen or fail to improve.

## 2021-02-16 ENCOUNTER — Encounter: Payer: Medicaid Other | Admitting: Podiatry

## 2021-02-17 ENCOUNTER — Other Ambulatory Visit: Payer: Self-pay | Admitting: Podiatry

## 2021-02-17 DIAGNOSIS — M2011 Hallux valgus (acquired), right foot: Secondary | ICD-10-CM | POA: Diagnosis not present

## 2021-02-17 DIAGNOSIS — Z4889 Encounter for other specified surgical aftercare: Secondary | ICD-10-CM

## 2021-02-17 MED ORDER — OXYCODONE HCL 5 MG PO TABS: 5.0000 mg | ORAL_TABLET | Freq: Four times a day (QID) | ORAL | 0 refills | Status: AC | PRN

## 2021-02-17 MED ORDER — IBUPROFEN 800 MG PO TABS: 800.0000 mg | ORAL_TABLET | Freq: Three times a day (TID) | ORAL | 2 refills | Status: DC | PRN

## 2021-02-17 NOTE — Progress Notes (Signed)
02/17/21 removal of hardware

## 2021-02-23 ENCOUNTER — Other Ambulatory Visit: Payer: Self-pay

## 2021-02-23 ENCOUNTER — Ambulatory Visit (INDEPENDENT_AMBULATORY_CARE_PROVIDER_SITE_OTHER): Payer: Medicaid Other | Admitting: Podiatry

## 2021-02-23 ENCOUNTER — Ambulatory Visit (INDEPENDENT_AMBULATORY_CARE_PROVIDER_SITE_OTHER): Payer: Medicaid Other

## 2021-02-23 DIAGNOSIS — M2012 Hallux valgus (acquired), left foot: Secondary | ICD-10-CM

## 2021-02-23 DIAGNOSIS — M21612 Bunion of left foot: Secondary | ICD-10-CM

## 2021-02-23 DIAGNOSIS — T8484XA Pain due to internal orthopedic prosthetic devices, implants and grafts, initial encounter: Secondary | ICD-10-CM

## 2021-02-28 NOTE — Progress Notes (Signed)
  Subjective:  Patient ID: Caleb Hunter, male    DOB: 24-Dec-1965,  MRN: [id number]  Chief Complaint  Patient presents with   Routine Post Op      POV #1 DOS 02/17/2021 HARDWARE REMOVAL LT FOOT     55 y.o. male returns for post-op check.   Review of Systems: Negative except as noted in the HPI. Denies N/V/F/Ch.   Objective:  There were no vitals filed for this visit. There is no height or weight on file to calculate BMI. Constitutional Well developed. Well nourished.  Vascular Foot warm and well perfused. Capillary refill normal to all digits.   Neurologic Normal speech. Oriented to person, place, and time. Epicritic sensation to light touch grossly present bilaterally.  Dermatologic Skin healing well without signs of infection. Skin edges well coapted without signs of infection.  Orthopedic: Tenderness to palpation noted about the surgical site.   Multiple view plain film radiographs: Successful removal of all hardware Assessment:   1. Pain due to internal orthopedic prosthetic devices, implants and grafts, initial encounter (HCC)   2. Hallux valgus with bunions, left    Plan:  Patient was evaluated and treated and all questions answered.  S/p foot surgery left -Progressing as expected post-operatively. -XR: As above -WB Status: WBAT in surgical shoe -Sutures: Removed at next visit. -Medications: No refills required -Foot redressed.  Return in about 2 weeks (around 03/09/2021) for suture removal, post op (no x-rays).

## 2021-03-02 ENCOUNTER — Encounter: Payer: Medicaid Other | Admitting: Podiatry

## 2021-03-09 ENCOUNTER — Other Ambulatory Visit: Payer: Self-pay

## 2021-03-09 ENCOUNTER — Ambulatory Visit (INDEPENDENT_AMBULATORY_CARE_PROVIDER_SITE_OTHER): Payer: Medicaid Other | Admitting: Podiatry

## 2021-03-09 DIAGNOSIS — Z9889 Other specified postprocedural states: Secondary | ICD-10-CM

## 2021-03-09 DIAGNOSIS — T8484XA Pain due to internal orthopedic prosthetic devices, implants and grafts, initial encounter: Secondary | ICD-10-CM

## 2021-03-14 NOTE — Progress Notes (Signed)
  Subjective:  Patient ID: Caleb Hunter, male    DOB: 12-03-1965,  MRN: [id number]  Chief Complaint  Patient presents with   Routine Post Op    Sutures removed from surgical site without complications. Patient denies nausea, vomiting, fever, chills and pain. Patient is concerns with the swelling in the left foot and would like to speak to the provider about it.      55 y.o. male returns for post-op check.   Review of Systems: Negative except as noted in the HPI. Denies N/V/F/Ch.   Objective:  There were no vitals filed for this visit. There is no height or weight on file to calculate BMI. Constitutional Well developed. Well nourished.  Vascular Foot warm and well perfused. Capillary refill normal to all digits.   Neurologic Normal speech. Oriented to person, place, and time. Epicritic sensation to light touch grossly present bilaterally.  Dermatologic Skin healing well without signs of infection. Skin edges well coapted without signs of infection.  Orthopedic: Tenderness to palpation noted about the surgical site.  Moderate edema still   Multiple view plain film radiographs: Successful removal of all hardware Assessment:   1. Pain due to internal orthopedic prosthetic devices, implants and grafts, initial encounter (HCC)   2. Post-operative state     Plan:  Patient was evaluated and treated and all questions answered.  S/p foot surgery left -Sutures removed uneventfully.  He is WBAT in regular shoe gears as tolerated.  We will see him back in a few weeks and reevaluate his progress.  Planning for surgery on the other foot in January.  No follow-ups on file.

## 2021-03-16 ENCOUNTER — Other Ambulatory Visit: Payer: Self-pay

## 2021-03-16 ENCOUNTER — Ambulatory Visit (INDEPENDENT_AMBULATORY_CARE_PROVIDER_SITE_OTHER): Payer: Medicaid Other | Admitting: Podiatry

## 2021-03-16 DIAGNOSIS — T8484XA Pain due to internal orthopedic prosthetic devices, implants and grafts, initial encounter: Secondary | ICD-10-CM

## 2021-03-16 MED ORDER — IBUPROFEN 800 MG PO TABS: 800.0000 mg | ORAL_TABLET | Freq: Three times a day (TID) | ORAL | 2 refills | Status: DC | PRN

## 2021-03-17 NOTE — Progress Notes (Signed)
  Subjective:  Patient ID: Caleb Hunter, male    DOB: December 20, 1965,  MRN: [id number]  Chief Complaint  Patient presents with   Routine Post Op      POV #3 DOS 02/17/2021 HARDWARE REMOVAL LT FOOT     55 y.o. male returns for post-op check.   Review of Systems: Negative except as noted in the HPI. Denies N/V/F/Ch. Swelling improving   Objective:  There were no vitals filed for this visit. There is no height or weight on file to calculate BMI. Constitutional Well developed. Well nourished.  Vascular Foot warm and well perfused. Capillary refill normal to all digits.   Neurologic Normal speech. Oriented to person, place, and time. Epicritic sensation to light touch grossly present bilaterally.  Dermatologic Skin healing well without signs of infection. Skin edges well coapted without signs of infection.  Orthopedic: Tenderness to palpation noted about the surgical site.  Moderate edema still   Multiple view plain film radiographs: Successful removal of all hardware Assessment:   1. Pain due to internal orthopedic prosthetic devices, implants and grafts, initial encounter [facility])      Plan:  Patient was evaluated and treated and all questions answered.  S/p foot surgery left -Doing well, I will see him back in January for a pre surgery  planning visit for his right foot -Refill motrin sent to pharmacy   Return in 12 weeks (on 06/08/2021) for surgery planning visit for Jan 6 for R foot fusion .

## 2021-03-23 ENCOUNTER — Encounter: Payer: Medicaid Other | Admitting: Podiatry

## 2021-05-09 ENCOUNTER — Ambulatory Visit (INDEPENDENT_AMBULATORY_CARE_PROVIDER_SITE_OTHER): Payer: Medicaid Other | Admitting: Podiatry

## 2021-05-09 ENCOUNTER — Ambulatory Visit (INDEPENDENT_AMBULATORY_CARE_PROVIDER_SITE_OTHER): Payer: Medicaid Other

## 2021-05-09 ENCOUNTER — Other Ambulatory Visit: Payer: Self-pay

## 2021-05-09 DIAGNOSIS — M21612 Bunion of left foot: Secondary | ICD-10-CM | POA: Diagnosis not present

## 2021-05-09 DIAGNOSIS — M2012 Hallux valgus (acquired), left foot: Secondary | ICD-10-CM

## 2021-05-09 DIAGNOSIS — M2042 Other hammer toe(s) (acquired), left foot: Secondary | ICD-10-CM

## 2021-05-09 DIAGNOSIS — G5792 Unspecified mononeuropathy of left lower limb: Secondary | ICD-10-CM

## 2021-05-09 MED ORDER — IBUPROFEN 800 MG PO TABS: 800.0000 mg | ORAL_TABLET | Freq: Three times a day (TID) | ORAL | 2 refills | Status: DC | PRN

## 2021-05-10 NOTE — Progress Notes (Signed)
  Subjective:  Patient ID: Caleb Hunter, male    DOB: [birth year],  MRN: [id number]  Chief Complaint  Patient presents with   Foot Pain    Status post painful hardware removal DOS 02/17/21     55 y.o. male returns for post-op check.  Left continues to give him quite a bit of pain and feels very stiff  Review of Systems: Negative except as noted in the HPI. Denies N/V/F/Ch. Swelling improving   Objective:  There were no vitals filed for this visit. There is no height or weight on file to calculate BMI. Constitutional Well developed. Well nourished.  Vascular Foot warm and well perfused. Capillary refill normal to all digits.   Neurologic Normal speech. Oriented to person, place, and time. Epicritic sensation to light touch grossly present bilaterally.  Dermatologic Skin healing well without signs of infection. Skin edges well coapted without signs of infection.  Orthopedic: Is moderate pain around the second and third MTPJ's and in the first interspace as well as the medial hallux   Multiple view plain film radiographs: Successful removal of all hardware, new films taken today show no abnormalities Assessment:   1. Hallux valgus with bunions, left   2. Hammertoe of left foot   3. Neuritis of left foot      Plan:  Patient was evaluated and treated and all questions answered.  S/p foot surgery left -Continues to have quite a bit of discomfort.  I do think he has some stiffness neuritis and capsulitis over the dorsal MTPJ's and the interspace.  I recommended corticosteroid injection of 4 mg dexamethasone 20 mg of Kenalog and 1 cc of lidocaine today.  This was done following sterile prep with alcohol.  We may need to consider revision of his left foot before proceeding with right foot surgery.  He will let me know how the injection does for him in the next 2 weeks if not improving then I would order an MRI -Refill motrin sent to pharmacy   Return for will call in 2 weeks to let  me know how he is doing .

## 2021-05-11 ENCOUNTER — Other Ambulatory Visit: Payer: Self-pay | Admitting: Neurosurgery

## 2021-05-23 NOTE — Pre-Procedure Instructions (Signed)
Surgical Instructions    Your procedure is scheduled on Wednesday 05/31/21.   Report to [facility] Main Entrance "A" at 05:30 A.M., then check in with the Admitting office.  Call this number if you have problems the morning of surgery:  [phone]   If you have any questions prior to your surgery date call [phone]: Open Monday-Friday 8am-4pm    Remember:  Do not eat or drink after midnight the night before your surgery   Take these medicines the morning of surgery with A SIP OF WATER   amLODipine (NORVASC)  budesonide-formoterol (SYMBICORT)  omeprazole (PRILOSEC)     Take these medicines if needed:  albuterol (PROVENTIL) allopurinol (ZYLOPRIM)  clobetasol cream (TEMOVATE) Olopatadine HCl  oxyCODONE-acetaminophen (PERCOCET) Epinephrine   As of today, STOP taking any Aspirin (unless otherwise instructed by your surgeon) Aleve, Naproxen, Ibuprofen, Motrin, Advil, Goody's, BC's, all herbal medications, fish oil, and all vitamins.     After your COVID test   You are not required to quarantine however you are required to wear a well-fitting mask when you are out and around people not in your household.  If your mask becomes wet or soiled, replace with a new one.  Wash your hands often with soap and water for 20 seconds or clean your hands with an alcohol-based hand sanitizer that contains at least 60% alcohol.  Do not share personal items.  Notify your provider: if you are in close contact with someone who has COVID  or if you develop a fever of 100.4 or greater, sneezing, cough, sore throat, shortness of breath or body aches.             Do not wear jewelry or makeup Do not wear lotions, powders, perfumes/colognes, or deodorant. Do not shave 48 hours prior to surgery.  Men may shave face and neck. Do not bring valuables to the hospital. DO Not wear nail polish, gel polish, artificial nails, or any other type of covering on natural nails including finger and  toenails. If patients have artificial nails, gel coating, etc. that need to be removed by a nail salon, please have this removed prior to surgery or surgery may need to be canceled/delayed if the surgeon/ anesthesia feels like the patient is unable to be adequately monitored.             [facility] is not responsible for any belongings or valuables.  Do NOT Smoke (Tobacco/Vaping)  24 hours prior to your procedure  If you use a CPAP at night, you may bring your mask for your overnight stay.   Contacts, glasses, hearing aids, dentures or partials may not be worn into surgery, please bring cases for these belongings   For patients admitted to the hospital, discharge time will be determined by your treatment team.   Patients discharged the day of surgery will not be allowed to drive home, and someone needs to stay with them for 24 hours.  NO VISITORS WILL BE ALLOWED IN PRE-OP WHERE PATIENTS ARE PREPPED FOR SURGERY.  ONLY 1 SUPPORT PERSON MAY BE PRESENT IN THE WAITING ROOM WHILE YOU ARE IN SURGERY.  IF YOU ARE TO BE ADMITTED, ONCE YOU ARE IN YOUR ROOM YOU WILL BE ALLOWED TWO (2) VISITORS. 1 (ONE) VISITOR MAY STAY OVERNIGHT BUT MUST ARRIVE TO THE ROOM BY 8pm.  Minor children may have two parents present. Special consideration for safety and communication needs will be reviewed on a case by case basis.  Special instructions:  Oral Hygiene is also important to reduce your risk of infection.  Remember - BRUSH YOUR TEETH THE MORNING OF SURGERY WITH YOUR REGULAR TOOTHPASTE   [facility]- Preparing For Surgery  Before surgery, you can play an important role. Because skin is not sterile, your skin needs to be as free of germs as possible. You can reduce the number of germs on your skin by washing with CHG (chlorahexidine gluconate) Soap before surgery.  CHG is an antiseptic cleaner which kills germs and bonds with the skin to continue killing germs even after washing.     Please do not use if you  have an allergy to CHG or antibacterial soaps. If your skin becomes reddened/irritated stop using the CHG.  Do not shave (including legs and underarms) for at least 48 hours prior to first CHG shower. It is OK to shave your face.  Please follow these instructions carefully.     Shower the NIGHT BEFORE SURGERY and the MORNING OF SURGERY with CHG Soap.   If you chose to wash your hair, wash your hair first as usual with your normal shampoo. After you shampoo, rinse your hair and body thoroughly to remove the shampoo.  Then [employer] and genitals (private parts) with your normal soap and rinse thoroughly to remove soap.  After that Use CHG Soap as you would any other liquid soap. You can apply CHG directly to the skin and wash gently with a scrungie or a clean washcloth.   Apply the CHG Soap to your body ONLY FROM THE NECK DOWN.  Do not use on open wounds or open sores. Avoid contact with your eyes, ears, mouth and genitals (private parts). Wash Face and genitals (private parts)  with your normal soap.   Wash thoroughly, paying special attention to the area where your surgery will be performed.  Thoroughly rinse your body with warm water from the neck down.  DO NOT shower/wash with your normal soap after using and rinsing off the CHG Soap.  Pat yourself dry with a CLEAN TOWEL.  Wear CLEAN PAJAMAS to bed the night before surgery  Place CLEAN SHEETS on your bed the night before your surgery  DO NOT SLEEP WITH PETS.   Day of Surgery:  Take a shower with CHG soap. Wear Clean/Comfortable clothing the morning of surgery Do not apply any deodorants/lotions.   Remember to brush your teeth WITH YOUR REGULAR TOOTHPASTE.   Please read over the following fact sheets that you were given.

## 2021-05-24 ENCOUNTER — Encounter (HOSPITAL_COMMUNITY): Payer: Self-pay

## 2021-05-24 ENCOUNTER — Encounter (HOSPITAL_COMMUNITY)
Admission: RE | Admit: 2021-05-24 | Discharge: 2021-05-24 | Disposition: A | Discharge status: Home / Self Care | Payer: Medicaid Other | Source: Ambulatory Visit | Attending: Neurosurgery | Admitting: Neurosurgery

## 2021-05-24 ENCOUNTER — Other Ambulatory Visit: Payer: Self-pay

## 2021-05-24 VITALS — BP 135/86 | HR 67 | Temp 98.0°F | Resp 19 | Ht 75.0 in | Wt 274.0 lb

## 2021-05-24 DIAGNOSIS — I10 Essential (primary) hypertension: Secondary | ICD-10-CM | POA: Insufficient documentation

## 2021-05-24 DIAGNOSIS — Z01818 Encounter for other preprocedural examination: Secondary | ICD-10-CM

## 2021-05-24 DIAGNOSIS — Z01812 Encounter for preprocedural laboratory examination: Secondary | ICD-10-CM | POA: Diagnosis present

## 2021-05-24 DIAGNOSIS — R7303 Prediabetes: Secondary | ICD-10-CM | POA: Insufficient documentation

## 2021-05-24 HISTORY — DX: Prediabetes: R73.03

## 2021-05-24 HISTORY — DX: Gastro-esophageal reflux disease without esophagitis: K21.9

## 2021-05-24 HISTORY — DX: Anxiety disorder, unspecified: F41.9

## 2021-05-24 LAB — TYPE AND SCREEN
ABO/RH(D): O POS
Antibody Screen: NEGATIVE

## 2021-05-24 LAB — CBC
HCT: 49 % (ref 39.0–52.0)
Hemoglobin: 16.2 g/dL (ref 13.0–17.0)
MCH: 26.8 pg (ref 26.0–34.0)
MCHC: 33.1 g/dL (ref 30.0–36.0)
MCV: 81.1 fL (ref 80.0–100.0)
Platelets: 194 10*3/uL (ref 150–400)
RBC: 6.04 MIL/uL — ABNORMAL HIGH (ref 4.22–5.81)
RDW: 14.6 % (ref 11.5–15.5)
WBC: 8.1 10*3/uL (ref 4.0–10.5)
nRBC: 0 % (ref 0.0–0.2)

## 2021-05-24 LAB — SURGICAL PCR SCREEN
MRSA, PCR: NEGATIVE
Staphylococcus aureus: NEGATIVE

## 2021-05-24 LAB — GLUCOSE, CAPILLARY: Glucose-Capillary: 120 mg/dL — ABNORMAL HIGH (ref 70–99)

## 2021-05-24 LAB — BASIC METABOLIC PANEL
Anion gap: 11 (ref 5–15)
BUN: 16 mg/dL (ref 6–20)
CO2: 27 mmol/L (ref 22–32)
Calcium: 9.4 mg/dL (ref 8.9–10.3)
Chloride: 96 mmol/L — ABNORMAL LOW (ref 98–111)
Creatinine, Ser: 1.16 mg/dL (ref 0.61–1.24)
GFR, Estimated: >60 mL/min (ref >60)
Glucose, Bld: 103 mg/dL — ABNORMAL HIGH (ref 70–99)
Potassium: 3.4 mmol/L — ABNORMAL LOW (ref 3.5–5.1)
Sodium: 134 mmol/L — ABNORMAL LOW (ref 135–145)

## 2021-05-24 LAB — HEMOGLOBIN A1C
Hgb A1c MFr Bld: 6.4 % — ABNORMAL HIGH (ref 4.8–5.6)
Mean Plasma Glucose: 136.98 mg/dL

## 2021-05-24 NOTE — Progress Notes (Signed)
   05/24/21 0931  OBSTRUCTIVE SLEEP APNEA  Have you ever been diagnosed with sleep apnea through a sleep study? No  Do you snore loudly (loud enough to be heard through closed doors)?  0  Do you often feel tired, fatigued, or sleepy during the daytime (such as falling asleep during driving or talking to someone)? 1  Has anyone observed you stop breathing during your sleep? 1  Do you have, or are you being treated for high blood pressure? 1  BMI more than 35 kg/m2? 0  Age > 50 (1-yes) 1  Neck circumference greater than:Male 16 inches or larger, Male 17inches or larger? 1  Male Gender (Yes=1) 1  Obstructive Sleep Apnea Score 6  Score 5 or greater  Results sent to PCP

## 2021-05-24 NOTE — Progress Notes (Addendum)
PCP - Dr. Greggory Stallion Osei-Bonsu Cardiologist - Denies  Chest x-ray - Not indicated EKG - 08/02/20 Stress Test - Been awhile no F/U needed ECHO - Denies Cardiac Cath - Denies  Sleep Study - Denies  Borderline at one point in his history per patient. CBG @ PAT appt 120 after eating a sandwich this am. Will also check A1C  COVID TEST- 05/29/21   Anesthesia review: No  Patient denies shortness of breath, fever, cough and chest pain at PAT appointment   All instructions explained to the patient, with a verbal understanding of the material. Patient agrees to go over the instructions while at home for a better understanding. Patient also instructed to wear a mask while in public after being tested for COVID-19. The opportunity to ask questions was provided.

## 2021-05-29 ENCOUNTER — Other Ambulatory Visit (HOSPITAL_COMMUNITY)
Admission: RE | Admit: 2021-05-29 | Discharge: 2021-05-29 | Disposition: A | Discharge status: Home / Self Care | Payer: Medicaid Other | Source: Ambulatory Visit | Attending: Neurosurgery | Admitting: Neurosurgery

## 2021-05-29 DIAGNOSIS — Z01812 Encounter for preprocedural laboratory examination: Secondary | ICD-10-CM | POA: Diagnosis present

## 2021-05-29 DIAGNOSIS — Z20822 Contact with and (suspected) exposure to covid-19: Secondary | ICD-10-CM | POA: Insufficient documentation

## 2021-05-29 DIAGNOSIS — Z01818 Encounter for other preprocedural examination: Secondary | ICD-10-CM

## 2021-05-29 LAB — SARS CORONAVIRUS 2 (TAT 6-24 HRS): SARS Coronavirus 2: NEGATIVE

## 2021-05-30 NOTE — Anesthesia Preprocedure Evaluation (Addendum)
Anesthesia Evaluation  Patient identified by MRN, date of birth, ID band Patient awake    Reviewed: Allergy & Precautions, H&P , NPO status , Patient's Chart, lab work & pertinent test results  Airway Mallampati: II  TM Distance: >3 FB Neck ROM: Full    Dental no notable dental hx. (+) Teeth Intact, Dental Advisory Given   Pulmonary asthma ,    Pulmonary exam normal breath sounds clear to auscultation       Cardiovascular hypertension,  Rhythm:Regular Rate:Normal     Neuro/Psych PSYCHIATRIC DISORDERS Anxiety Depression negative neurological ROS     GI/Hepatic Neg liver ROS, GERD  ,  Endo/Other  negative endocrine ROSprediabetes  Renal/GU negative Renal ROS  negative genitourinary   Musculoskeletal  (+) Arthritis , Osteoarthritis,    Abdominal   Peds negative pediatric ROS (+)  Hematology negative hematology ROS (+)   Anesthesia Other Findings   Reproductive/Obstetrics negative OB ROS                            Anesthesia Physical Anesthesia Plan  ASA: 3  Anesthesia Plan: General   Post-op Pain Management: Tylenol PO (pre-op)   Induction: Intravenous  PONV Risk Score and Plan: 2 and Treatment may vary due to age or medical condition, Ondansetron, Dexamethasone and Midazolam  Airway Management Planned: Oral ETT  Additional Equipment: None  Intra-op Plan:   Post-operative Plan: Extubation in OR  Informed Consent: I have reviewed the patients History and Physical, chart, labs and discussed the procedure including the risks, benefits and alternatives for the proposed anesthesia with the patient or authorized representative who has indicated his/her understanding and acceptance.     Dental advisory given  Plan Discussed with: CRNA and Anesthesiologist  Anesthesia Plan Comments:        Anesthesia Quick Evaluation

## 2021-05-31 ENCOUNTER — Other Ambulatory Visit: Payer: Self-pay

## 2021-05-31 ENCOUNTER — Encounter (HOSPITAL_COMMUNITY)
Admission: RE | Disposition: A | Discharge status: Home / Self Care | Payer: Self-pay | Source: Home / Self Care | Attending: Neurosurgery

## 2021-05-31 ENCOUNTER — Ambulatory Visit (HOSPITAL_COMMUNITY): Payer: Medicaid Other

## 2021-05-31 ENCOUNTER — Ambulatory Visit (HOSPITAL_COMMUNITY): Payer: Medicaid Other | Admitting: Anesthesiology

## 2021-05-31 ENCOUNTER — Encounter (HOSPITAL_COMMUNITY): Payer: Self-pay | Admitting: Neurosurgery

## 2021-05-31 ENCOUNTER — Ambulatory Visit (HOSPITAL_COMMUNITY)
Admission: RE | Admit: 2021-05-31 | Discharge: 2021-05-31 | Disposition: A | Discharge status: Home / Self Care | Payer: Medicaid Other | Attending: Neurosurgery | Admitting: Neurosurgery

## 2021-05-31 DIAGNOSIS — M48061 Spinal stenosis, lumbar region without neurogenic claudication: Secondary | ICD-10-CM | POA: Diagnosis present

## 2021-05-31 DIAGNOSIS — M5416 Radiculopathy, lumbar region: Secondary | ICD-10-CM | POA: Diagnosis not present

## 2021-05-31 DIAGNOSIS — Z419 Encounter for procedure for purposes other than remedying health state, unspecified: Secondary | ICD-10-CM

## 2021-05-31 DIAGNOSIS — M48062 Spinal stenosis, lumbar region with neurogenic claudication: Secondary | ICD-10-CM | POA: Insufficient documentation

## 2021-05-31 DIAGNOSIS — I10 Essential (primary) hypertension: Secondary | ICD-10-CM | POA: Insufficient documentation

## 2021-05-31 LAB — GLUCOSE, CAPILLARY: Glucose-Capillary: 123 mg/dL — ABNORMAL HIGH (ref 70–99)

## 2021-05-31 LAB — ABO/RH: ABO/RH(D): O POS

## 2021-05-31 IMAGING — RF DG LUMBAR SPINE 2-3V
1 series · 2 of 2 positions shown · non-contrast
Comparison: [DATE].

CLINICAL DATA: Surgical posterior fusion of L3-4.

EXAM:
LUMBAR SPINE - 2-3 VIEW; DG C-ARM 1-60 MIN-NO REPORT
Radiation exposure index: 25.37 mGy.

[Series 1: run · 2 of 2 slices shown]
[im 1/2]
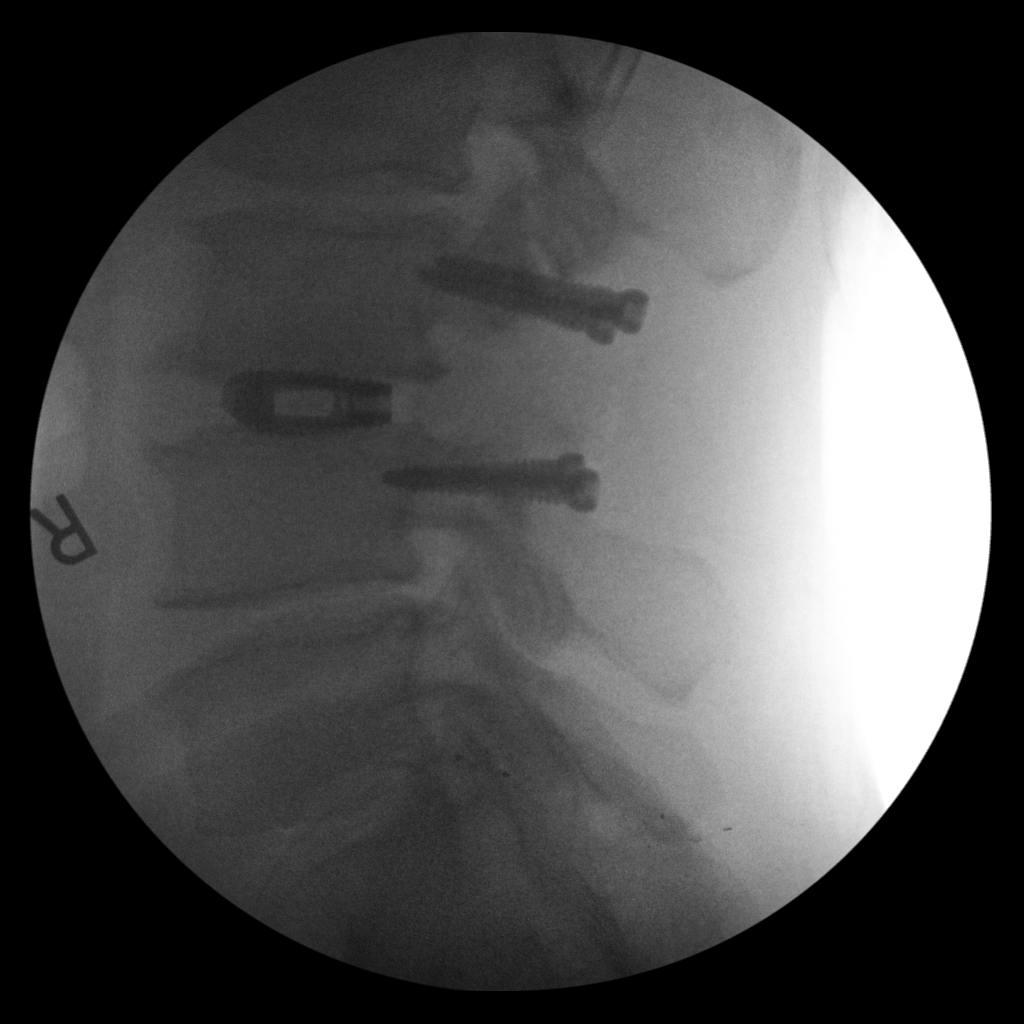
[im 2/2]
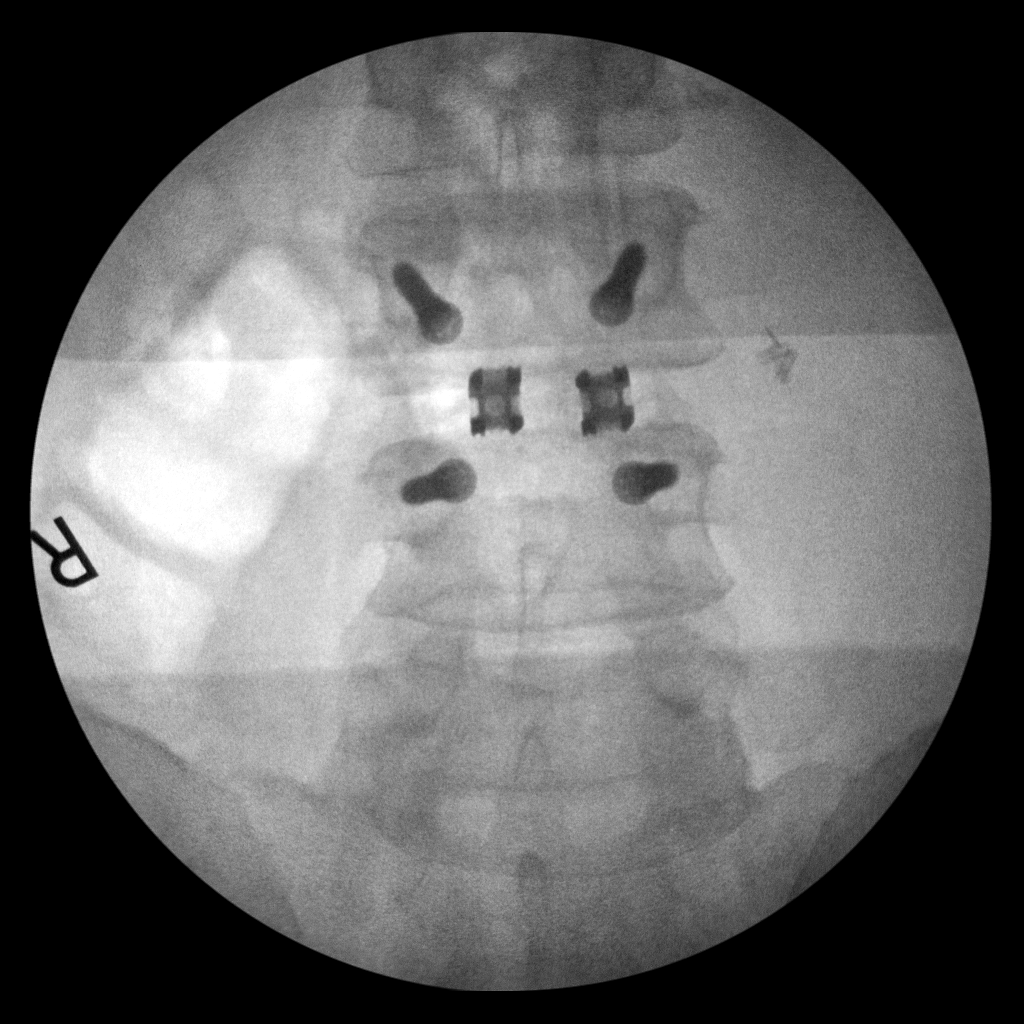

[2 of 2 positions shown; findings below may reference images not displayed]

FINDINGS: Two intraoperative fluoroscopic images were obtained of the lumbar
spine. These images demonstrate the patient be status post
intrapedicular screw placement at L3-4 with interbody fusion.
IMPRESSION: Fluoroscopic guidance provided during surgical posterior fusion of
L3-4.

## 2021-05-31 SURGERY — POSTERIOR LUMBAR FUSION 1 LEVEL
Anesthesia: General | Site: Back

## 2021-05-31 MED ORDER — COLCHICINE 0.6 MG PO CAPS: 1.0000 | ORAL_CAPSULE | Freq: Every day | ORAL | Status: DC | PRN

## 2021-05-31 MED ORDER — EPHEDRINE SULFATE-NACL 50-0.9 MG/10ML-% IV SOSY
PREFILLED_SYRINGE | INTRAVENOUS | Status: DC | PRN
  Administered 2021-05-31: 5 mg via INTRAVENOUS

## 2021-05-31 MED ORDER — 0.9 % SODIUM CHLORIDE (POUR BTL) OPTIME
TOPICAL | Status: DC | PRN
  Administered 2021-05-31 (×2): 1000 mL

## 2021-05-31 MED ORDER — SODIUM CHLORIDE 0.9% FLUSH: 3.0000 mL | Freq: Two times a day (BID) | INTRAVENOUS | Status: DC

## 2021-05-31 MED ORDER — AMLODIPINE BESYLATE 5 MG PO TABS: 10.0000 mg | ORAL_TABLET | Freq: Every day | ORAL | Status: DC

## 2021-05-31 MED ORDER — ORAL CARE MOUTH RINSE: 15.0000 mL | Freq: Once | OROMUCOSAL | Status: AC

## 2021-05-31 MED ORDER — MONTELUKAST SODIUM 10 MG PO TABS: 10.0000 mg | ORAL_TABLET | Freq: Every day | ORAL | Status: DC

## 2021-05-31 MED ORDER — ACETAMINOPHEN 325 MG PO TABS: 325.0000 mg | ORAL_TABLET | Freq: Once | ORAL | Status: DC | PRN

## 2021-05-31 MED ORDER — PANTOPRAZOLE SODIUM 40 MG IV SOLR: 40.0000 mg | Freq: Every day | INTRAVENOUS | Status: DC

## 2021-05-31 MED ORDER — MENTHOL 3 MG MT LOZG: 1.0000 | LOZENGE | OROMUCOSAL | Status: DC | PRN

## 2021-05-31 MED ORDER — PROPOFOL 10 MG/ML IV BOLUS
INTRAVENOUS | Status: AC
  Filled 2021-05-31: qty 40

## 2021-05-31 MED ORDER — LIDOCAINE-EPINEPHRINE 1 %-1:100000 IJ SOLN
INTRAMUSCULAR | Status: AC
  Filled 2021-05-31: qty 1

## 2021-05-31 MED ORDER — ROCURONIUM BROMIDE 10 MG/ML (PF) SYRINGE
PREFILLED_SYRINGE | INTRAVENOUS | Status: DC | PRN
Start: 2021-05-31 — End: 2021-05-31
  Administered 2021-05-31: 70 mg via INTRAVENOUS
  Administered 2021-05-31: 30 mg via INTRAVENOUS

## 2021-05-31 MED ORDER — OXYCODONE-ACETAMINOPHEN 10-325 MG PO TABS: 1.0000 | ORAL_TABLET | Freq: Four times a day (QID) | ORAL | Status: DC | PRN

## 2021-05-31 MED ORDER — CEFAZOLIN IN SODIUM CHLORIDE 3-0.9 GM/100ML-% IV SOLN
3.0000 g | INTRAVENOUS | Status: AC
  Administered 2021-05-31: 3 g via INTRAVENOUS
  Filled 2021-05-31: qty 100

## 2021-05-31 MED ORDER — ACETAMINOPHEN 160 MG/5ML PO SOLN: 325.0000 mg | Freq: Once | ORAL | Status: DC | PRN

## 2021-05-31 MED ORDER — CHLORHEXIDINE GLUCONATE CLOTH 2 % EX PADS: 6.0000 | MEDICATED_PAD | Freq: Once | CUTANEOUS | Status: DC

## 2021-05-31 MED ORDER — PHENOL 1.4 % MT LIQD: 1.0000 | OROMUCOSAL | Status: DC | PRN

## 2021-05-31 MED ORDER — ATENOLOL 50 MG PO TABS
50.0000 mg | ORAL_TABLET | Freq: Every day | ORAL | Status: DC
  Administered 2021-05-31: 50 mg via ORAL
  Filled 2021-05-31: qty 1

## 2021-05-31 MED ORDER — ACETAMINOPHEN 500 MG PO TABS
1000.0000 mg | ORAL_TABLET | Freq: Once | ORAL | Status: AC
  Administered 2021-05-31: 1000 mg via ORAL
  Filled 2021-05-31: qty 2

## 2021-05-31 MED ORDER — OXYCODONE HCL 5 MG PO TABS
5.0000 mg | ORAL_TABLET | Freq: Four times a day (QID) | ORAL | Status: DC | PRN
  Administered 2021-05-31: 5 mg via ORAL
  Filled 2021-05-31: qty 1

## 2021-05-31 MED ORDER — MEPERIDINE HCL 25 MG/ML IJ SOLN: 6.2500 mg | INTRAMUSCULAR | Status: DC | PRN

## 2021-05-31 MED ORDER — ATORVASTATIN CALCIUM 40 MG PO TABS: 40.0000 mg | ORAL_TABLET | Freq: Every day | ORAL | Status: DC

## 2021-05-31 MED ORDER — SUGAMMADEX SODIUM 200 MG/2ML IV SOLN
INTRAVENOUS | Status: DC | PRN
  Administered 2021-05-31: 300 mg via INTRAVENOUS

## 2021-05-31 MED ORDER — HYDROMORPHONE HCL 1 MG/ML IJ SOLN
0.2500 mg | INTRAMUSCULAR | Status: DC | PRN
  Administered 2021-05-31: 0.5 mg via INTRAVENOUS

## 2021-05-31 MED ORDER — EPINEPHRINE 0.3 MG/0.3ML IJ SOAJ: 0.3000 mg | INTRAMUSCULAR | Status: DC | PRN

## 2021-05-31 MED ORDER — SODIUM CHLORIDE 0.9 % IV SOLN: 250.0000 mL | INTRAVENOUS | Status: DC

## 2021-05-31 MED ORDER — FENTANYL CITRATE (PF) 250 MCG/5ML IJ SOLN
INTRAMUSCULAR | Status: AC
  Filled 2021-05-31: qty 5

## 2021-05-31 MED ORDER — THROMBIN 20000 UNITS EX SOLR
CUTANEOUS | Status: AC
  Filled 2021-05-31: qty 20000

## 2021-05-31 MED ORDER — CHLORHEXIDINE GLUCONATE 0.12 % MT SOLN
15.0000 mL | Freq: Once | OROMUCOSAL | Status: AC
  Administered 2021-05-31: 15 mL via OROMUCOSAL
  Filled 2021-05-31: qty 15

## 2021-05-31 MED ORDER — SODIUM CHLORIDE 0.9% FLUSH: 3.0000 mL | INTRAVENOUS | Status: DC | PRN

## 2021-05-31 MED ORDER — ACETAMINOPHEN 325 MG PO TABS: 650.0000 mg | ORAL_TABLET | ORAL | Status: DC | PRN

## 2021-05-31 MED ORDER — OLOPATADINE HCL 0.1 % OP SOLN: 1.0000 [drp] | Freq: Two times a day (BID) | OPHTHALMIC | Status: DC

## 2021-05-31 MED ORDER — BUPIVACAINE HCL (PF) 0.25 % IJ SOLN
INTRAMUSCULAR | Status: AC
  Filled 2021-05-31: qty 30

## 2021-05-31 MED ORDER — HYDROCODONE-ACETAMINOPHEN 5-325 MG PO TABS: 1.0000 | ORAL_TABLET | ORAL | Status: DC | PRN

## 2021-05-31 MED ORDER — LACTATED RINGERS IV SOLN: INTRAVENOUS | Status: DC

## 2021-05-31 MED ORDER — ACETAMINOPHEN 10 MG/ML IV SOLN: 1000.0000 mg | Freq: Once | INTRAVENOUS | Status: DC | PRN

## 2021-05-31 MED ORDER — FENTANYL CITRATE (PF) 250 MCG/5ML IJ SOLN
INTRAMUSCULAR | Status: DC | PRN
  Administered 2021-05-31: 50 ug via INTRAVENOUS
  Administered 2021-05-31: 100 ug via INTRAVENOUS
  Administered 2021-05-31 (×2): 50 ug via INTRAVENOUS

## 2021-05-31 MED ORDER — HYDROMORPHONE HCL 1 MG/ML IJ SOLN
INTRAMUSCULAR | Status: AC
  Filled 2021-05-31: qty 1

## 2021-05-31 MED ORDER — EPHEDRINE 5 MG/ML INJ
INTRAVENOUS | Status: AC
  Filled 2021-05-31: qty 5

## 2021-05-31 MED ORDER — ACETAMINOPHEN 650 MG RE SUPP: 650.0000 mg | RECTAL | Status: DC | PRN

## 2021-05-31 MED ORDER — DEXAMETHASONE SODIUM PHOSPHATE 10 MG/ML IJ SOLN
INTRAMUSCULAR | Status: DC | PRN
Start: 2021-05-31 — End: 2021-05-31
  Administered 2021-05-31: 10 mg via INTRAVENOUS

## 2021-05-31 MED ORDER — CHLORTHALIDONE 25 MG PO TABS
25.0000 mg | ORAL_TABLET | Freq: Every day | ORAL | Status: DC
  Filled 2021-05-31: qty 1

## 2021-05-31 MED ORDER — LACTATED RINGERS IV SOLN: INTRAVENOUS | Status: DC | PRN

## 2021-05-31 MED ORDER — ALUM & MAG HYDROXIDE-SIMETH 200-200-20 MG/5ML PO SUSP: 30.0000 mL | Freq: Four times a day (QID) | ORAL | Status: DC | PRN

## 2021-05-31 MED ORDER — THROMBIN 5000 UNITS EX SOLR
CUTANEOUS | Status: AC
  Filled 2021-05-31: qty 5000

## 2021-05-31 MED ORDER — AMISULPRIDE (ANTIEMETIC) 5 MG/2ML IV SOLN: 10.0000 mg | Freq: Once | INTRAVENOUS | Status: DC | PRN

## 2021-05-31 MED ORDER — ALBUTEROL SULFATE (2.5 MG/3ML) 0.083% IN NEBU: 2.5000 mg | INHALATION_SOLUTION | Freq: Four times a day (QID) | RESPIRATORY_TRACT | Status: DC | PRN

## 2021-05-31 MED ORDER — MOMETASONE FURO-FORMOTEROL FUM 100-5 MCG/ACT IN AERO
2.0000 | INHALATION_SPRAY | Freq: Two times a day (BID) | RESPIRATORY_TRACT | Status: DC
  Filled 2021-05-31: qty 8.8

## 2021-05-31 MED ORDER — ALBUTEROL SULFATE HFA 108 (90 BASE) MCG/ACT IN AERS: 2.0000 | INHALATION_SPRAY | RESPIRATORY_TRACT | Status: DC | PRN

## 2021-05-31 MED ORDER — ONDANSETRON HCL 4 MG/2ML IJ SOLN
INTRAMUSCULAR | Status: DC | PRN
  Administered 2021-05-31: 4 mg via INTRAVENOUS

## 2021-05-31 MED ORDER — ONDANSETRON HCL 4 MG PO TABS: 4.0000 mg | ORAL_TABLET | Freq: Four times a day (QID) | ORAL | Status: DC | PRN

## 2021-05-31 MED ORDER — CEFAZOLIN SODIUM-DEXTROSE 2-4 GM/100ML-% IV SOLN: 2.0000 g | Freq: Three times a day (TID) | INTRAVENOUS | Status: DC

## 2021-05-31 MED ORDER — LIDOCAINE 2% (20 MG/ML) 5 ML SYRINGE
INTRAMUSCULAR | Status: DC | PRN
  Administered 2021-05-31: 60 mg via INTRAVENOUS

## 2021-05-31 MED ORDER — THROMBIN 20000 UNITS EX SOLR
CUTANEOUS | Status: DC | PRN
  Administered 2021-05-31: 20 mL via TOPICAL

## 2021-05-31 MED ORDER — MIDAZOLAM HCL 2 MG/2ML IJ SOLN
INTRAMUSCULAR | Status: AC
  Filled 2021-05-31: qty 2

## 2021-05-31 MED ORDER — LIDOCAINE-EPINEPHRINE 1 %-1:100000 IJ SOLN
INTRAMUSCULAR | Status: DC | PRN
  Administered 2021-05-31: 9 mL

## 2021-05-31 MED ORDER — CYCLOBENZAPRINE HCL 10 MG PO TABS
10.0000 mg | ORAL_TABLET | Freq: Three times a day (TID) | ORAL | Status: DC | PRN
  Administered 2021-05-31: 10 mg via ORAL
  Filled 2021-05-31: qty 1

## 2021-05-31 MED ORDER — ONDANSETRON HCL 4 MG/2ML IJ SOLN: 4.0000 mg | Freq: Four times a day (QID) | INTRAMUSCULAR | Status: DC | PRN

## 2021-05-31 MED ORDER — ATENOLOL-CHLORTHALIDONE 50-25 MG PO TABS: 1.0000 | ORAL_TABLET | Freq: Every day | ORAL | Status: DC

## 2021-05-31 MED ORDER — PROMETHAZINE HCL 25 MG/ML IJ SOLN
6.2500 mg | INTRAMUSCULAR | Status: DC | PRN
Start: 2021-05-31 — End: 2021-05-31

## 2021-05-31 MED ORDER — BUPIVACAINE LIPOSOME 1.3 % IJ SUSP
INTRAMUSCULAR | Status: DC | PRN
  Administered 2021-05-31: 20 mL

## 2021-05-31 MED ORDER — MIDAZOLAM HCL 2 MG/2ML IJ SOLN
INTRAMUSCULAR | Status: DC | PRN
  Administered 2021-05-31: 2 mg via INTRAVENOUS

## 2021-05-31 MED ORDER — CLOBETASOL PROPIONATE 0.05 % EX CREA: 1.0000 "application " | TOPICAL_CREAM | Freq: Two times a day (BID) | CUTANEOUS | Status: DC | PRN

## 2021-05-31 MED ORDER — BUPIVACAINE LIPOSOME 1.3 % IJ SUSP
INTRAMUSCULAR | Status: AC
  Filled 2021-05-31: qty 20

## 2021-05-31 MED ORDER — OXYCODONE-ACETAMINOPHEN 5-325 MG PO TABS
1.0000 | ORAL_TABLET | Freq: Four times a day (QID) | ORAL | Status: DC | PRN
  Administered 2021-05-31: 1 via ORAL
  Filled 2021-05-31: qty 1

## 2021-05-31 MED ORDER — PROPOFOL 10 MG/ML IV BOLUS
INTRAVENOUS | Status: DC | PRN
  Administered 2021-05-31: 200 mg via INTRAVENOUS
  Administered 2021-05-31: 50 mg via INTRAVENOUS

## 2021-05-31 MED ORDER — ALLOPURINOL 300 MG PO TABS: 300.0000 mg | ORAL_TABLET | Freq: Every day | ORAL | Status: DC | PRN

## 2021-05-31 MED ORDER — HYDROMORPHONE HCL 1 MG/ML IJ SOLN
0.5000 mg | INTRAMUSCULAR | Status: DC | PRN
  Administered 2021-05-31: 0.5 mg via INTRAVENOUS
  Filled 2021-05-31: qty 0.5

## 2021-05-31 MED ORDER — PANTOPRAZOLE SODIUM 40 MG PO TBEC: 40.0000 mg | DELAYED_RELEASE_TABLET | Freq: Every day | ORAL | Status: DC

## 2021-05-31 SURGICAL SUPPLY — 69 items
BAG COUNTER SPONGE SURGICOUNT (BAG) ×2 IMPLANT
BASKET BONE COLLECTION (BASKET) ×2 IMPLANT
BENZOIN TINCTURE PRP APPL 2/3 (GAUZE/BANDAGES/DRESSINGS) ×2 IMPLANT
BLADE CLIPPER SURG (BLADE) IMPLANT
BLADE SURG 11 STRL SS (BLADE) ×2 IMPLANT
BONE VIVIGEN FORMABLE 5.4CC (Bone Implant) ×2 IMPLANT
BUR CUTTER 7.0 ROUND (BURR) ×2 IMPLANT
BUR MATCHSTICK NEURO 3.0 LAGG (BURR) ×2 IMPLANT
CANISTER SUCT 3000ML PPV (MISCELLANEOUS) ×2 IMPLANT
CAP LOCKING THREADED (Cap) ×8 IMPLANT
CARTRIDGE OIL MAESTRO DRILL (MISCELLANEOUS) ×1 IMPLANT
CNTNR URN SCR LID CUP LEK RST (MISCELLANEOUS) ×1 IMPLANT
CONT SPEC 4OZ STRL OR WHT (MISCELLANEOUS) ×2
COVER BACK TABLE 60X90IN (DRAPES) ×2 IMPLANT
DECANTER SPIKE VIAL GLASS SM (MISCELLANEOUS) ×2 IMPLANT
DERMABOND ADVANCED (GAUZE/BANDAGES/DRESSINGS) ×1
DERMABOND ADVANCED .7 DNX12 (GAUZE/BANDAGES/DRESSINGS) ×1 IMPLANT
DIFFUSER DRILL AIR PNEUMATIC (MISCELLANEOUS) ×2 IMPLANT
DRAPE C-ARM 42X72 X-RAY (DRAPES) ×4 IMPLANT
DRAPE C-ARMOR (DRAPES) IMPLANT
DRAPE HALF SHEET 40X57 (DRAPES) IMPLANT
DRAPE LAPAROTOMY 100X72X124 (DRAPES) ×2 IMPLANT
DRAPE SURG 17X23 STRL (DRAPES) ×2 IMPLANT
DRSG OPSITE 4X5.5 SM (GAUZE/BANDAGES/DRESSINGS) ×2 IMPLANT
DRSG OPSITE POSTOP 4X6 (GAUZE/BANDAGES/DRESSINGS) ×2 IMPLANT
DURAPREP 26ML APPLICATOR (WOUND CARE) ×2 IMPLANT
ELECT REM PT RETURN 9FT ADLT (ELECTROSURGICAL) ×2
ELECTRODE REM PT RTRN 9FT ADLT (ELECTROSURGICAL) ×1 IMPLANT
EVACUATOR 3/16  PVC DRAIN (DRAIN)
EVACUATOR 3/16 PVC DRAIN (DRAIN) IMPLANT
GAUZE 4X4 16PLY ~~LOC~~+RFID DBL (SPONGE) IMPLANT
GAUZE SPONGE 4X4 12PLY STRL (GAUZE/BANDAGES/DRESSINGS) ×2 IMPLANT
GLOVE EXAM NITRILE XL STR (GLOVE) IMPLANT
GLOVE SURG ENC MOIS LTX SZ7 (GLOVE) IMPLANT
GLOVE SURG ENC MOIS LTX SZ8 (GLOVE) ×4 IMPLANT
GLOVE SURG LTX SZ7 (GLOVE) ×10 IMPLANT
GLOVE SURG UNDER LTX SZ8.5 (GLOVE) ×4 IMPLANT
GLOVE SURG UNDER POLY LF SZ7 (GLOVE) IMPLANT
GLOVE SURG UNDER POLY LF SZ7.5 (GLOVE) ×6 IMPLANT
GOWN STRL REUS W/ TWL LRG LVL3 (GOWN DISPOSABLE) ×2 IMPLANT
GOWN STRL REUS W/ TWL XL LVL3 (GOWN DISPOSABLE) ×2 IMPLANT
GOWN STRL REUS W/TWL 2XL LVL3 (GOWN DISPOSABLE) IMPLANT
GOWN STRL REUS W/TWL LRG LVL3 (GOWN DISPOSABLE) ×4
GOWN STRL REUS W/TWL XL LVL3 (GOWN DISPOSABLE) ×4
GRAFT BONE PROTEIOS LRG 5CC (Orthopedic Implant) ×2 IMPLANT
KIT BASIN OR (CUSTOM PROCEDURE TRAY) ×2 IMPLANT
KIT GRAFTMAG DEL NEURO DISP (NEUROSURGERY SUPPLIES) IMPLANT
KIT TURNOVER KIT B (KITS) ×2 IMPLANT
MILL MEDIUM DISP (BLADE) ×2 IMPLANT
NEEDLE HYPO 25X1 1.5 SAFETY (NEEDLE) ×2 IMPLANT
NS IRRIG 1000ML POUR BTL (IV SOLUTION) ×4 IMPLANT
OIL CARTRIDGE MAESTRO DRILL (MISCELLANEOUS) ×2
PACK LAMINECTOMY NEURO (CUSTOM PROCEDURE TRAY) ×2 IMPLANT
PAD ARMBOARD 7.5X6 YLW CONV (MISCELLANEOUS) ×4 IMPLANT
ROD CREO 45MM SPINAL (Rod) ×4 IMPLANT
SCREW PA THRD CREO TULIP 5.5X4 (Head) ×8 IMPLANT
SHAFT CREO 30MM (Neuro Prosthesis/Implant) ×8 IMPLANT
SPACER SUSTAIN TI 8X26X11 8D (Spacer) ×4 IMPLANT
SPONGE SURGIFOAM ABS GEL 100 (HEMOSTASIS) ×2 IMPLANT
SPONGE T-LAP 4X18 ~~LOC~~+RFID (SPONGE) ×2 IMPLANT
STRIP CLOSURE SKIN 1/2X4 (GAUZE/BANDAGES/DRESSINGS) ×2 IMPLANT
SUT VIC AB 0 CT1 18XCR BRD8 (SUTURE) ×1 IMPLANT
SUT VIC AB 0 CT1 8-18 (SUTURE) ×2
SUT VIC AB 2-0 CT1 18 (SUTURE) ×2 IMPLANT
SUT VIC AB 4-0 PS2 27 (SUTURE) ×2 IMPLANT
TOWEL GREEN STERILE (TOWEL DISPOSABLE) ×2 IMPLANT
TOWEL GREEN STERILE FF (TOWEL DISPOSABLE) ×2 IMPLANT
TRAY FOLEY MTR SLVR 16FR STAT (SET/KITS/TRAYS/PACK) ×2 IMPLANT
WATER STERILE IRR 1000ML POUR (IV SOLUTION) ×2 IMPLANT

## 2021-05-31 NOTE — Discharge Summary (Signed)
Physician Discharge Summary  Patient ID: Caleb Hunter MRN: [id number] DOB/AGE: 1966-02-15 55 y.o.  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 The patient tolerated the procedure well and was taken to the recovery room and then to the floor in stable condition. The hospital course was routine. There were no complications. The wound remained clean dry and intact. Pt had appropriate back soreness. No complaints of leg pain or new N/T/W. The patient remained afebrile with stable vital signs, and tolerated a regular diet. The patient continued to increase activities, and pain was well controlled with oral pain medications.   Consults: None  Significant Diagnostic Studies:  Results for orders placed or performed during the hospital encounter of 05/31/21  Glucose, capillary  Result Value Ref Range   Glucose-Capillary 123 (H) 70 - 99 mg/dL  ABO/Rh  Result Value Ref Range   ABO/RH(D)      O POS Performed at [facility], [address] [place], [place] [address]     DG Lumbar Spine 2-3 Views  Result Date: 05/31/2021 CLINICAL DATA:  Surgical posterior fusion of L3-4. EXAM: LUMBAR SPINE - 2-3 VIEW; DG C-ARM 1-60 MIN-NO REPORT Radiation exposure index: 25.37 mGy. COMPARISON:  January 04, 2021. FINDINGS: Two intraoperative fluoroscopic images were obtained of the lumbar spine. These images demonstrate the patient be status post intrapedicular screw placement at L3-4 with interbody fusion. IMPRESSION: Fluoroscopic guidance provided during surgical posterior fusion of L3-4. Electronically Signed   By: Lupita Raider M.D.   On: 05/31/2021 10:34   DG Foot Complete Left  Result  Date: 05/09/2021 Please see detailed radiograph report in office note.  DG C-Arm 1-60 Min-No Report  Result Date: 05/31/2021 CLINICAL DATA:  Surgical posterior fusion of L3-4. EXAM: LUMBAR SPINE - 2-3 VIEW; DG C-ARM 1-60 MIN-NO REPORT Radiation exposure index: 25.37 mGy. COMPARISON:  January 04, 2021. FINDINGS: Two intraoperative fluoroscopic images were obtained of the lumbar spine. These images demonstrate the patient be status post intrapedicular screw placement at L3-4 with interbody fusion. IMPRESSION: Fluoroscopic guidance provided during surgical posterior fusion of L3-4. Electronically Signed   By: Lupita Raider M.D.   On: 05/31/2021 10:34   DG C-Arm 1-60 Min-No Report  Result Date: 05/31/2021 CLINICAL DATA:  Surgical posterior fusion of L3-4. EXAM: LUMBAR SPINE - 2-3 VIEW; DG C-ARM 1-60 MIN-NO REPORT Radiation exposure index: 25.37 mGy. COMPARISON:  January 04, 2021. FINDINGS: Two intraoperative fluoroscopic images were obtained of the lumbar spine. These images demonstrate the patient be status post intrapedicular screw placement at L3-4 with interbody fusion. IMPRESSION: Fluoroscopic guidance provided during surgical posterior fusion of L3-4. Electronically Signed   By: Lupita Raider M.D.   On: 05/31/2021 10:34    Antibiotics:  Anti-infectives (From admission, onward)    Start     Dose/Rate Route Frequency Ordered Stop   05/31/21 1600  ceFAZolin (ANCEF) IVPB 2g/100 mL premix        2 g 200 mL/hr over 30 Minutes Intravenous Every 8 hours 05/31/21 1144 06/01/21 0759   05/31/21 0600  ceFAZolin (ANCEF) IVPB 3g/100 mL premix        3 g 200 mL/hr over 30 Minutes Intravenous On call to O.R. 05/31/21 4680 05/31/21 3212  Discharge Exam: Blood pressure (!) 144/80, pulse 72, temperature 98.3 F (36.8 C), resp. rate 18, height 6' 3.5" (1.918 m), weight 122.9 kg, SpO2 97 %. Neurologic: Grossly normal Ambulating and voiding well, incision cdi   Discharge Medications:   Allergies as  of 05/31/2021       Reactions   Lisinopril Swelling   Lip angioedema Other reaction(s): anaphylaxis   Shellfish Allergy Anaphylaxis        Medication List     TAKE these medications    albuterol (2.5 MG/3ML) 0.083% nebulizer solution Commonly known as: PROVENTIL Take 2.5 mg by nebulization every 6 (six) hours as needed for wheezing or shortness of breath.   albuterol 108 (90 Base) MCG/ACT inhaler Commonly known as: VENTOLIN HFA Inhale 2 puffs into the lungs every 4 (four) hours as needed for shortness of breath or wheezing.   allopurinol 300 MG tablet Commonly known as: ZYLOPRIM Take 300 mg by mouth daily as needed (gout).   amLODipine 10 MG tablet Commonly known as: NORVASC Take 10 mg by mouth daily.   atenolol-chlorthalidone 50-25 MG tablet Commonly known as: TENORETIC Take 1 tablet by mouth daily.   atorvastatin 80 MG tablet Commonly known as: LIPITOR Take 40 mg by mouth at bedtime.   budesonide-formoterol 80-4.5 MCG/ACT inhaler Commonly known as: SYMBICORT Inhale 2 puffs into the lungs 2 (two) times daily.   clobetasol cream 0.05 % Commonly known as: TEMOVATE Apply 1 application topically 2 (two) times daily as needed (irritation).   Colchicine 0.6 MG Caps Take 1 tablet by mouth daily as needed (gout).   EPINEPHrine 0.3 mg/0.3 mL Soaj injection Commonly known as: EPI-PEN Inject 0.3 mg into the muscle as needed for anaphylaxis.   ibuprofen 800 MG tablet Commonly known as: ADVIL Take 1 tablet (800 mg total) by mouth every 8 (eight) hours as needed (Pain).   montelukast 10 MG tablet Commonly known as: SINGULAIR Take 10 mg by mouth at bedtime.   Olopatadine HCl 0.2 % Soln Place 1 drop into both eyes daily as needed (eye allergies).   omeprazole 20 MG capsule Commonly known as: PRILOSEC Take 20 mg by mouth daily.   oxyCODONE-acetaminophen 10-325 MG tablet Commonly known as: PERCOCET Take 1 tablet by mouth 4 (four) times daily as needed for  pain.        Disposition: home   Final Dx:  PLIF L3-4  Discharge Instructions      Remove dressing in 72 hours   Complete by: As directed    Call MD for:  difficulty breathing, headache or visual disturbances   Complete by: As directed    Call MD for:  hives   Complete by: As directed    Call MD for:  persistant dizziness or light-headedness   Complete by: As directed    Call MD for:  persistant nausea and vomiting   Complete by: As directed    Call MD for:  redness, tenderness, or signs of infection (pain, swelling, redness, odor or green/yellow discharge around incision site)   Complete by: As directed    Call MD for:  severe uncontrolled pain   Complete by: As directed    Call MD for:  temperature >100.4   Complete by: As directed    Diet - low sodium heart healthy   Complete by: As directed    Driving Restrictions   Complete by: As directed    No driving for 2 weeks, no riding in the car for 1 week   Increase  activity slowly   Complete by: As directed    Lifting restrictions   Complete by: As directed    No lifting more than 8 lbs          Signed: Tiana Loft Aceson Labell 05/31/2021, 2:36 PM

## 2021-05-31 NOTE — Progress Notes (Signed)
Orthopedic Tech Progress Note Patient Details:  Caleb Hunter [birth year] [id number]  PACU RN called requesting an ASPEN LUMBAR BACK BRACE   Ortho Devices Type of Ortho Device: Lumbar corsett Ortho Device/Splint Location: BACK Ortho Device/Splint Interventions: Ordered   Post Interventions Patient Tolerated: Well Instructions Provided: Care of device  [name] 05/31/2021, 11:10 AM

## 2021-05-31 NOTE — Evaluation (Signed)
Physical Therapy Evaluation and Discharge Patient Details Name: Caleb Hunter MRN: [id number] DOB: 19-Nov-1965 Today's Date: 05/31/2021  History of Present Illness  Pt is a 55 y/o male s/p L3-4 PLIF. PMH includes gout and HTN.  Clinical Impression  Patient evaluated by Physical Therapy with no further acute PT needs identified. All education has been completed and the patient has no further questions. Pt overall steady with mobility tasks. At a supervision level for transfers and gait, and min guard for stair navigation for safety only; no physical assist required and no overt LOB noted. Educated about back precautions, generalized walking program and how to maintain precautions during ADLs and car transfers. Pt reports wife will be able to assist at home. See below for any follow-up Physical Therapy or equipment needs. PT is signing off. Thank you for this referral. If needs change, please re-consult.         Recommendations for follow up therapy are one component of a multi-disciplinary discharge planning process, led by the attending physician.  Recommendations may be updated based on patient status, additional functional criteria and insurance authorization.  Follow Up Recommendations No PT follow up    Assistance Recommended at Discharge Intermittent Supervision/Assistance  Functional Status Assessment Patient has had a recent decline in their functional status and demonstrates the ability to make significant improvements in function in a reasonable and predictable amount of time.  Equipment Recommendations  BSC/3in1    Recommendations for Other Services       Precautions / Restrictions Precautions Precautions: Back Precaution Booklet Issued: Yes (comment) Precaution Comments: Reviewed back precautions with pt. Required Braces or Orthoses: Spinal Brace Spinal Brace: Lumbar corset Restrictions Weight Bearing Restrictions: No      Mobility  Bed Mobility                General bed mobility comments: In chair upon entry    Transfers Overall transfer level: Needs assistance Equipment used: None Transfers: Sit to/from Stand Sit to Stand: Supervision           General transfer comment: Supervision for safety    Ambulation/Gait Ambulation/Gait assistance: Supervision Gait Distance (Feet): 125 Feet Assistive device: None Gait Pattern/deviations: Step-through pattern;Decreased stride length Gait velocity: Decreased     General Gait Details: Mild functional weakness noted in LLE, but overall steady. No overt LOB noted. Educated about walking program to perform at home.  Stairs Stairs: Yes Stairs assistance: Min guard Stair Management: Forwards;Step to pattern Number of Stairs: 2 General stair comments: Practiced stair navigation using HHA. No overt LOB. Educated pt's wife about how to assist.  Engineer, drilling Rankin (Stroke Patients Only)       Balance Overall balance assessment: Mild deficits observed, not formally tested                                           Pertinent Vitals/Pain Pain Assessment: Faces Faces Pain Scale: Hurts little more Pain Location: back Pain Descriptors / Indicators: Guarding;Grimacing Pain Intervention(s): Limited activity within patient's tolerance;Monitored during session;Repositioned    Home Living Family/patient expects to be discharged to:: Private residence Living Arrangements: Spouse/significant other;Other relatives Available Help at Discharge: Family Type of Home: House Home Access: Stairs to enter Entrance Stairs-Rails: None Entrance Stairs-Number of Steps: 2   Home Layout: One level Home Equipment: None      Prior Function Prior Level  of Function : Independent/Modified Independent                     Hand Dominance        Extremity/Trunk Assessment   Upper Extremity Assessment Upper Extremity Assessment: Overall WFL for tasks  assessed    Lower Extremity Assessment Lower Extremity Assessment: LLE deficits/detail LLE Deficits / Details: Functional weakness noted in LLE    Cervical / Trunk Assessment Cervical / Trunk Assessment: Back Surgery  Communication   Communication: No difficulties  Cognition Arousal/Alertness: Awake/alert Behavior During Therapy: WFL for tasks assessed/performed Overall Cognitive Status: Within Functional Limits for tasks assessed                                          General Comments General comments (skin integrity, edema, etc.): Educated about how to maintain precautions during ADL tasks and car transfers    Exercises     Assessment/Plan    PT Assessment Patient does not need any further PT services  PT Problem List         PT Treatment Interventions      PT Goals (Current goals can be found in the Care Plan section)  Acute Rehab PT Goals Patient Stated Goal: to go home today PT Goal Formulation: With patient Time For Goal Achievement: 05/31/21 Potential to Achieve Goals: Good    Frequency     Barriers to discharge        Co-evaluation               AM-PAC PT "6 Clicks" Mobility  Outcome Measure Help needed turning from your back to your side while in a flat bed without using bedrails?: None Help needed moving from lying on your back to sitting on the side of a flat bed without using bedrails?: None Help needed moving to and from a bed to a chair (including a wheelchair)?: None Help needed standing up from a chair using your arms (e.g., wheelchair or bedside chair)?: None Help needed to walk in hospital room?: None Help needed climbing 3-5 steps with a railing? : A Little 6 Click Score: 23    End of Session Equipment Utilized During Treatment: Back brace Activity Tolerance: Patient tolerated treatment well Patient left: in chair;with call bell/phone within reach;with family/visitor present Nurse Communication: Mobility  status PT Visit Diagnosis: Other abnormalities of gait and mobility (R26.89);Pain Pain - part of body:  (back)    Time: 7342-8768 PT Time Calculation (min) (ACUTE ONLY): 11 min   Charges:   PT Evaluation $PT Eval Low Complexity: 1 Low          Cindee Salt, DPT  Acute Rehabilitation Services  Pager: [phone] Office: [phone]   Caleb Hunter 05/31/2021, 1:45 PM

## 2021-05-31 NOTE — Progress Notes (Signed)
OT Cancellation Note  Patient Details Name: Caleb Hunter MRN: [id number] DOB: May 13, 1966   Cancelled Treatment:    Reason Eval/Treat Not Completed: OT screened, no needs identified, will sign off  Suzanna Obey 05/31/2021, 1:27 PM
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
## 2021-05-31 NOTE — Transfer of Care (Signed)
Immediate Anesthesia Transfer of Care Note  Patient: Caleb Hunter  Procedure(s) Performed: Posterior Lumbar interbody Fusion Lumbar three-four (Back)  Patient Location: PACU  Anesthesia Type:General  Level of Consciousness: drowsy and patient cooperative  Airway & Oxygen Therapy: Patient Spontanous Breathing and Patient connected to nasal cannula oxygen  Post-op Assessment: Report given to RN, Post -op Vital signs reviewed and stable and Patient moving all extremities X 4  Post vital signs: Reviewed and stable  Last Vitals:  Vitals Value Taken Time  BP 135/98 05/31/21 1048  Temp 36.9 C 05/31/21 1048  Pulse 64 05/31/21 1052  Resp 12 05/31/21 1052  SpO2 99 % 05/31/21 1052  Vitals shown include unvalidated device data.  Last Pain:  Vitals:   05/31/21 0556  TempSrc:   PainSc: 7       Patients Stated Pain Goal: 3 (05/31/21 0556)  Complications: No notable events documented.

## 2021-05-31 NOTE — Anesthesia Procedure Notes (Signed)
Procedure Name: Intubation Date/Time: 05/31/2021 8:01 AM Performed by: Elliot Dally, CRNA Pre-anesthesia Checklist: Patient identified, Emergency Drugs available, Suction available and Patient being monitored Patient Re-evaluated:Patient Re-evaluated prior to induction Oxygen Delivery Method: Circle System Utilized Preoxygenation: Pre-oxygenation with 100% oxygen Induction Type: IV induction Ventilation: Mask ventilation without difficulty and Oral airway inserted - appropriate to patient size Laryngoscope Size: Miller and 3 Grade View: Grade I Tube type: Oral Tube size: 7.5 mm Number of attempts: 1 Airway Equipment and Method: Stylet and Oral airway Placement Confirmation: ETT inserted through vocal cords under direct vision, positive ETCO2 and breath sounds checked- equal and bilateral Secured at: 22 cm Tube secured with: Tape Dental Injury: Teeth and Oropharynx as per pre-operative assessment

## 2021-05-31 NOTE — Anesthesia Postprocedure Evaluation (Signed)
Anesthesia Post Note  Patient: Caleb Hunter  Procedure(s) Performed: Posterior Lumbar interbody Fusion Lumbar three-four (Back)     Patient location during evaluation: PACU Anesthesia Type: General Level of consciousness: awake and alert Pain management: pain level controlled Vital Signs Assessment: post-procedure vital signs reviewed and stable Respiratory status: spontaneous breathing, nonlabored ventilation and respiratory function stable Cardiovascular status: blood pressure returned to baseline and stable Postop Assessment: no apparent nausea or vomiting Anesthetic complications: no   No notable events documented.  Last Vitals:  Vitals:   05/31/21 1118 05/31/21 1142  BP: 133/77 (!) 144/80  Pulse: 74 72  Resp: 15 18  Temp: 36.8 C   SpO2: 96% 97%    Last Pain:  Vitals:   05/31/21 1118  TempSrc:   PainSc: 4                  Hiawatha Merriott,W. EDMOND

## 2021-05-31 NOTE — Op Note (Signed)
Preoperative diagnosis: Degenerative sees lumbar spinal stenosis neurogenic claudication and bilateral L3-L4 radiculopathies  Postoperative diagnosis: Same  Procedure: #1 decompressive lumbar laminectomy with complete medial facetectomies radical foraminotomies in excess and requiring more work than would be needed with a standard interbody fusion to decompress the L3 and L4 nerve roots bilaterally with complete disarticulation of the facet joint with removal of bilateral L3 pars  2.  Posterior lumbar interbody fusion L3-4 utilizing the globus insert and rotate titanium cages packed with locally harvested autograft mixed with vivigen and partialis  3.  Cortical screw fixation L3-4 utilizing the globus Creo amp modular cortical screw set with threaded caps  Surgeon: Jillyn Hidden Nivea Wojdyla  Assistant: Julien Girt  Anesthesia: General  EBL: Minimal  HPI: 55 year old gentleman with longstanding back pain bilateral hip and leg pain rating down L3 and L4 nerve root pattern neurogenic claudication and failure of all forms of conservative treatment.  Work-up revealed severe degenerative disc disease lumbar spinal stenosis marked facet arthropathy at L3-4.  Due to patient progression of clinical syndrome imaging findings and failed conservative treatment I recommended decompression stabilization procedure.  I extensively went over the risks and benefits of the operation with him as well as perioperative course expectations of outcome and alternatives of surgery and he understood and agreed to proceed forward.  Operative procedure: Patient was brought into the OR was Duson general anesthesia positioned prone Wilson frame his back was prepped and draped in routine sterile fashion.  Anatomical landmarks were used to the diagram out the incision and after infiltration of 10 cc lidocaine with epi a midline incision made and Bovie letter cautery was used take down the subcutaneous tissue and subperiosteal dissection was  carried lamina of L3 and L4 bilaterally.  Intraoperative x-ray confirmed identification appropriate level.  So then the spinous process at L3 was removed facet joints were drilled down capturing the bone shavings and mucus trap complete central decompression was begun I then blew through the pars at L3 completely removed facet joint aggressively under bit the supratrochlear facet of L4 to gain access to the lateral margin of the disc base performed foraminotomies of the L3 and L4 nerve roots bilaterally then incised the disc base coagulate the epidural veins cleaned out the disc base and prepared the endplates.  With an 11 distractor in place I sized up a 8-11 insert and rotate 8 degree cages inserted those with an extensive mount of autograft material both medially between the cages and laterally.  This was all done under fluoroscopy.  And then utilizing fluoroscopy: Cortical screws were placed bilaterally at L3 and L4 all screws had excellent purchase assembled the heads advanced the screws compressed L3 against L4 and then tightened everything down.  I reinspected the foramina to confirm patency no migration of graft material Gelfoam was ON top of the dura Exparel was injected in the fascia and then the wound was closed in layers with opted Vicryl skin was closed running 4 subcuticular Dermabond benzoin Steri-Strips and a sterile dressing was applied patient recovery in stable condition.  At the end the case all needle counts and sponge counts were correct.

## 2021-05-31 NOTE — Plan of Care (Signed)
Pt doing well. Pt given D/C instructions with verbal understanding. Pt's incision is clean and dry with no sign of infection. Pt's IV was removed prior to D/C. Pt received 3-n-1 from [employer] per MD order. Pt D/C'd home via wheelchair per MD order. Pt is stable @ D/C and has no other needs at this time. Rema Fendt, RN

## 2021-06-08 ENCOUNTER — Ambulatory Visit: Payer: Medicaid Other | Admitting: Podiatry

## 2021-06-15 ENCOUNTER — Ambulatory Visit: Payer: Medicaid Other | Admitting: Podiatry

## 2021-06-15 ENCOUNTER — Emergency Department (HOSPITAL_BASED_OUTPATIENT_CLINIC_OR_DEPARTMENT_OTHER)
Admission: EM | Admit: 2021-06-15 | Discharge: 2021-06-15 | Disposition: A | Discharge status: Home / Self Care | Payer: Medicaid Other | Attending: Student | Admitting: Student

## 2021-06-15 ENCOUNTER — Emergency Department (HOSPITAL_BASED_OUTPATIENT_CLINIC_OR_DEPARTMENT_OTHER): Payer: Medicaid Other

## 2021-06-15 ENCOUNTER — Ambulatory Visit (INDEPENDENT_AMBULATORY_CARE_PROVIDER_SITE_OTHER): Payer: Self-pay | Admitting: Podiatry

## 2021-06-15 ENCOUNTER — Other Ambulatory Visit (HOSPITAL_BASED_OUTPATIENT_CLINIC_OR_DEPARTMENT_OTHER): Payer: Self-pay

## 2021-06-15 ENCOUNTER — Other Ambulatory Visit: Payer: Self-pay

## 2021-06-15 ENCOUNTER — Encounter (HOSPITAL_BASED_OUTPATIENT_CLINIC_OR_DEPARTMENT_OTHER): Payer: Self-pay | Admitting: Emergency Medicine

## 2021-06-15 DIAGNOSIS — R059 Cough, unspecified: Secondary | ICD-10-CM | POA: Diagnosis present

## 2021-06-15 DIAGNOSIS — I10 Essential (primary) hypertension: Secondary | ICD-10-CM | POA: Diagnosis not present

## 2021-06-15 DIAGNOSIS — Z79899 Other long term (current) drug therapy: Secondary | ICD-10-CM | POA: Insufficient documentation

## 2021-06-15 DIAGNOSIS — M19272 Secondary osteoarthritis, left ankle and foot: Secondary | ICD-10-CM

## 2021-06-15 DIAGNOSIS — J4521 Mild intermittent asthma with (acute) exacerbation: Secondary | ICD-10-CM | POA: Insufficient documentation

## 2021-06-15 DIAGNOSIS — Z7951 Long term (current) use of inhaled steroids: Secondary | ICD-10-CM | POA: Diagnosis not present

## 2021-06-15 IMAGING — DX DG CHEST 1V PORT
1 series · 1 of 1 positions shown · non-contrast
Comparison: [DATE]

CLINICAL DATA: 55-year-old male with cough and congestion, evaluate
for pneumonia.

EXAM:
PORTABLE CHEST - 1 VIEW

[chest ap]
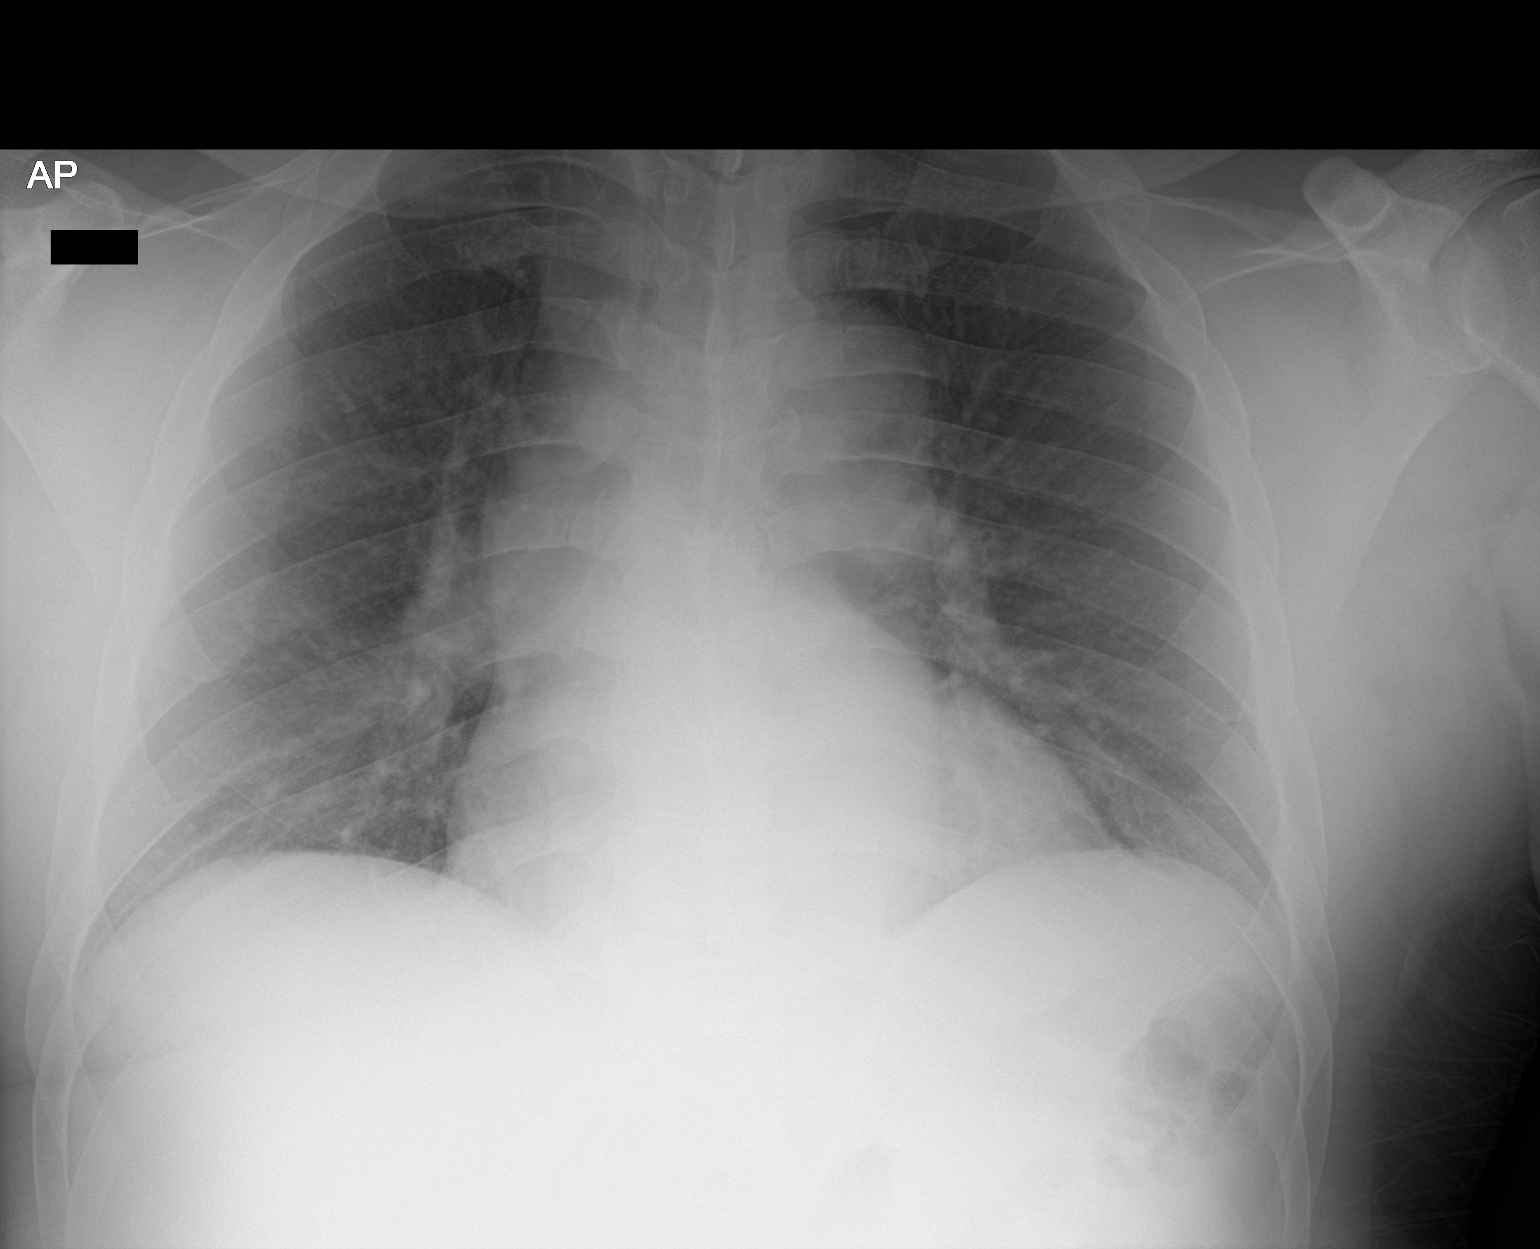

[1 of 1 positions shown; findings below may reference images not displayed]

FINDINGS: The mediastinal contours are within normal limits. No cardiomegaly.
The lungs are clear bilaterally without evidence of focal
consolidation, pleural effusion, or pneumothorax. No acute osseous
abnormality.
IMPRESSION: No acute cardiopulmonary process.

## 2021-06-15 MED ORDER — PREDNISONE 10 MG PO TABS
40.0000 mg | ORAL_TABLET | Freq: Every day | ORAL | 0 refills | Status: AC
  Filled 2021-06-15: qty 16, 4d supply, fill #0

## 2021-06-15 MED ORDER — IBUPROFEN 800 MG PO TABS: 800.0000 mg | ORAL_TABLET | Freq: Three times a day (TID) | ORAL | 2 refills | Status: DC | PRN

## 2021-06-15 MED ORDER — IPRATROPIUM-ALBUTEROL 0.5-2.5 (3) MG/3ML IN SOLN
3.0000 mL | Freq: Once | RESPIRATORY_TRACT | Status: AC
  Administered 2021-06-15: 3 mL via RESPIRATORY_TRACT

## 2021-06-15 MED ORDER — ALBUTEROL SULFATE (2.5 MG/3ML) 0.083% IN NEBU
2.5000 mg | INHALATION_SOLUTION | Freq: Once | RESPIRATORY_TRACT | Status: AC
  Administered 2021-06-15: 2.5 mg via RESPIRATORY_TRACT

## 2021-06-15 MED ORDER — IPRATROPIUM-ALBUTEROL 0.5-2.5 (3) MG/3ML IN SOLN
3.0000 mL | Freq: Once | RESPIRATORY_TRACT | Status: DC
  Filled 2021-06-15: qty 3

## 2021-06-15 MED ORDER — ALBUTEROL SULFATE (2.5 MG/3ML) 0.083% IN NEBU
INHALATION_SOLUTION | RESPIRATORY_TRACT | Status: AC
  Filled 2021-06-15: qty 3

## 2021-06-15 MED ORDER — METHYLPREDNISOLONE SODIUM SUCC 125 MG IJ SOLR
125.0000 mg | Freq: Once | INTRAMUSCULAR | Status: AC
  Administered 2021-06-15: 125 mg via INTRAMUSCULAR
  Filled 2021-06-15: qty 2

## 2021-06-15 MED ORDER — IPRATROPIUM-ALBUTEROL 0.5-2.5 (3) MG/3ML IN SOLN
RESPIRATORY_TRACT | Status: AC
  Filled 2021-06-15: qty 3

## 2021-06-15 MED ORDER — IPRATROPIUM-ALBUTEROL 0.5-2.5 (3) MG/3ML IN SOLN
6.0000 mL | Freq: Once | RESPIRATORY_TRACT | Status: AC
  Administered 2021-06-15: 6 mL via RESPIRATORY_TRACT
  Filled 2021-06-15: qty 3
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
## 2021-06-15 NOTE — Progress Notes (Signed)
  Subjective:  Patient ID: Caleb Hunter, male    DOB: Mar 11, 1966,  MRN: [id number]  Chief Complaint  Patient presents with   Bunions    Left foot , very painful     55 y.o. male returns for post-op check.  Left continues to give him quite a bit of pain and feels very stiff  Review of Systems: Negative except as noted in the HPI. Denies N/V/F/Ch. Swelling improving   Objective:  There were no vitals filed for this visit. There is no height or weight on file to calculate BMI. Constitutional Well developed. Well nourished.  Vascular Foot warm and well perfused. Capillary refill normal to all digits.   Neurologic Normal speech. Oriented to person, place, and time. Epicritic sensation to light touch grossly present bilaterally.  Dermatologic Skin healing well without signs of infection. Skin edges well coapted without signs of infection.  Orthopedic: Is moderate pain around the second and third MTPJ's and in the first interspace as well as the medial hallux   Multiple view plain film radiographs: Successful removal of all hardware, new films taken today show no abnormalities Assessment:   1. Other secondary osteoarthritis of left foot      Plan:  Patient was evaluated and treated and all questions answered.  S/p foot surgery left -Following sterile prep with Betadine I performed a repeat direct intra-articular injection into the second and third MTPJ today.  He tolerated well and was dressed with a Band-Aid. -Refill motrin sent to pharmacy -He continues to have quite a bit of foot pain.  I am concerned for an suspicious of developing osteo-arthritis worsening in his forefoot.  I am ordering an MRI to evaluate the extent of this and ordered for ongoing surgical planning  Return for call after MRI to review.

## 2021-06-15 NOTE — ED Triage Notes (Signed)
Pt c/o cough, congestion and chest inflammation x 2 days. Pt states he needs a steroid shot and prednisone. Pt last used neb x 1 hour ago.

## 2021-07-05 ENCOUNTER — Telehealth: Payer: Self-pay | Admitting: Podiatry

## 2021-07-05 NOTE — Telephone Encounter (Signed)
I didn't have time to complete it in time (they closed yesterday by the time I finished patients) will do as soon as I can. Does he still have enough ibuprofen? I can send him prednisone as well to calm the inflammation down

## 2021-07-05 NOTE — Telephone Encounter (Signed)
Patient was schedule for MRI tomorrow but it needs a pre auth.  Patient stated that his left foot is in severe pain

## 2021-07-06 ENCOUNTER — Encounter: Payer: Self-pay | Admitting: Podiatry

## 2021-07-06 ENCOUNTER — Other Ambulatory Visit: Payer: Medicaid Other

## 2021-07-06 MED ORDER — METHYLPREDNISOLONE 4 MG PO TBPK: ORAL_TABLET | ORAL | 0 refills | Status: DC

## 2021-07-06 NOTE — Addendum Note (Signed)
Addended byLilian Kapur, Hani Campusano R on: 07/06/2021 09:20 AM   Modules accepted: Orders

## 2021-07-12 NOTE — Telephone Encounter (Signed)
I completed an appeal letter but have not heard back on a decision for it yet

## 2021-07-12 NOTE — Telephone Encounter (Signed)
Patient called and wanted to know if Caleb Hunter was done for MRI.

## 2021-07-13 ENCOUNTER — Ambulatory Visit (INDEPENDENT_AMBULATORY_CARE_PROVIDER_SITE_OTHER): Payer: Medicaid Other | Admitting: Podiatry

## 2021-07-13 ENCOUNTER — Other Ambulatory Visit: Payer: Self-pay

## 2021-07-13 DIAGNOSIS — M19272 Secondary osteoarthritis, left ankle and foot: Secondary | ICD-10-CM | POA: Diagnosis not present

## 2021-07-13 DIAGNOSIS — G5792 Unspecified mononeuropathy of left lower limb: Secondary | ICD-10-CM | POA: Diagnosis not present

## 2021-07-13 MED ORDER — IBUPROFEN 800 MG PO TABS: 800.0000 mg | ORAL_TABLET | Freq: Three times a day (TID) | ORAL | 2 refills | Status: AC | PRN

## 2021-07-14 ENCOUNTER — Telehealth: Payer: Self-pay | Admitting: *Deleted

## 2021-07-14 NOTE — Telephone Encounter (Signed)
Excellent thank you

## 2021-07-14 NOTE — Telephone Encounter (Signed)
Mendel Ryder w/ Medicaid appeals dept. [phone] or [phone] is calling to inform that it has been approved 01/06-03/06/23,any questions, please contact.[facility]) has been contacted and they will call patient to schedule.

## 2021-07-17 NOTE — Progress Notes (Signed)
°  Subjective:  Patient ID: Caleb Hunter, male    DOB: September 27, 1965,  MRN: [id number]  Chief Complaint  Patient presents with   Arthritis    Osteoarthritis left foot     56 y.o. male returns for post-op check.  MRI has not been scheduled yet he has not received word for approval.  Last injection did not help much.  Review of Systems: Negative except as noted in the HPI. Denies N/V/F/Ch. Swelling improving   Objective:  There were no vitals filed for this visit. There is no height or weight on file to calculate BMI. Constitutional Well developed. Well nourished.  Vascular Foot warm and well perfused. Capillary refill normal to all digits.   Neurologic Normal speech. Oriented to person, place, and time. Epicritic sensation to light touch grossly present bilaterally.  Dermatologic Skin healing well without signs of infection. Skin edges well coapted without signs of infection.  Orthopedic: He still has moderate pain around the second and third MTPJ's and in the first interspace as well as the medial hallux   Multiple view plain film radiographs: Successful removal of all hardware, new films taken today show no abnormalities Assessment:   1. Other secondary osteoarthritis of left foot   2. Neuritis of left foot       Plan:  Patient was evaluated and treated and all questions answered.  S/p foot surgery left  -Refill motrin sent to pharmacy -MRI still has not been approved.  I submitted an appeal and waiting her back from [place].  They will let him know when is ready to schedule.  I will see him back after the MRI. Return for after MRI to review.

## 2021-08-24 ENCOUNTER — Telehealth: Payer: Self-pay | Admitting: Podiatry

## 2021-08-24 NOTE — Telephone Encounter (Signed)
Pt called and still has not heard anything about scheduling his mri. He is in a lot of pain. IT has been over a month. Please advise pt.

## 2021-08-31 ENCOUNTER — Other Ambulatory Visit: Payer: Self-pay | Admitting: Neurosurgery

## 2021-08-31 DIAGNOSIS — M542 Cervicalgia: Secondary | ICD-10-CM

## 2021-09-05 ENCOUNTER — Telehealth: Payer: Self-pay | Admitting: *Deleted

## 2021-09-05 NOTE — Telephone Encounter (Signed)
Patient is calling for status of his MRI. Called [facility], spoke with [facility], and she is going to call and schedule him on same day as the MRI of the spine.I tried to contact patient,no answer, could not leave vmessage. Patient has been scheduled.

## 2021-09-19 ENCOUNTER — Ambulatory Visit
Admission: RE | Admit: 2021-09-19 | Discharge: 2021-09-19 | Disposition: A | Discharge status: Home / Self Care | Payer: Medicaid Other | Source: Ambulatory Visit | Attending: Neurosurgery | Admitting: Neurosurgery

## 2021-09-19 ENCOUNTER — Other Ambulatory Visit: Payer: Self-pay

## 2021-09-19 ENCOUNTER — Ambulatory Visit
Admission: RE | Admit: 2021-09-19 | Discharge: 2021-09-19 | Disposition: A | Discharge status: Home / Self Care | Payer: Medicaid Other | Source: Ambulatory Visit | Attending: Podiatry | Admitting: Podiatry

## 2021-09-19 DIAGNOSIS — M542 Cervicalgia: Secondary | ICD-10-CM

## 2021-09-19 DIAGNOSIS — M19272 Secondary osteoarthritis, left ankle and foot: Secondary | ICD-10-CM

## 2021-09-19 IMAGING — MR MR LUMBAR SPINE W/O CM
4 of 5 series · 18 of 48 positions shown · non-contrast
Comparison: Lumbar spine radiographs [DATE]. Lumbar MRI
[DATE].

CLINICAL DATA: Low back pain radiating into both legs with back
stiffness since surgery 4 months ago. No recent injections.

EXAM:
MRI LUMBAR SPINE WITHOUT CONTRAST
TECHNIQUE: Multiplanar, multisequence MR imaging of the lumbar spine was
performed. No intravenous contrast was administered.

[Series 6: T2 · sagittal · 4.0mm · 0.78mm/px · 6 of 17 slices shown (1 of 2)]
[im 1/17]
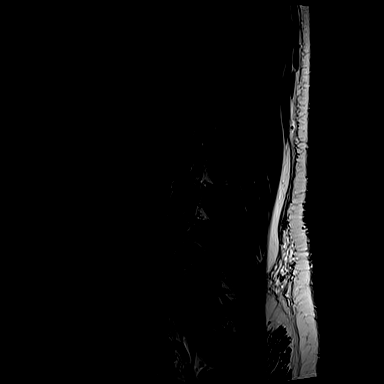
[im 4/17]
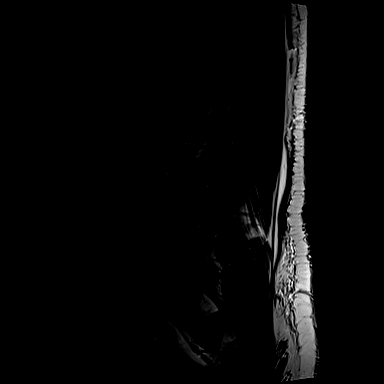
[im 7/17]
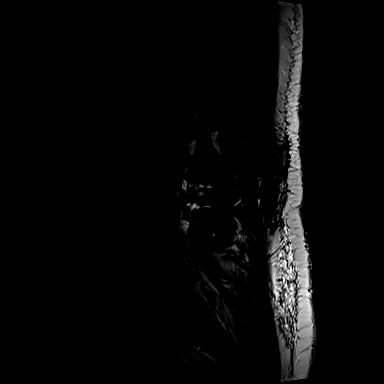
[im 10/17]
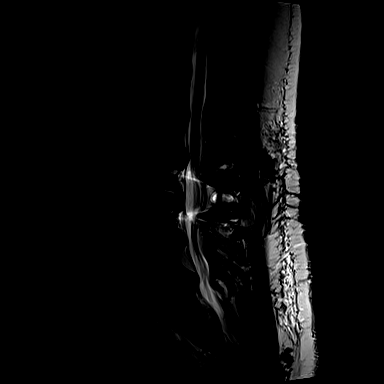
[im 13/17]
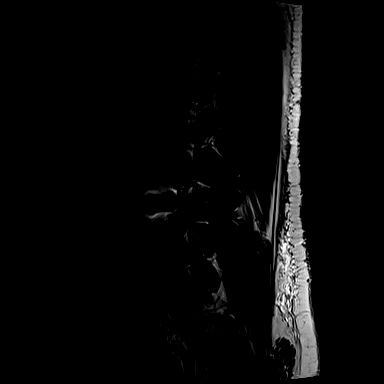
[im 17/17]
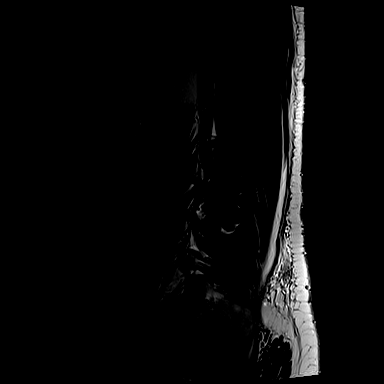

[Series 7: T1 · sagittal · 4.0mm · 0.78mm/px · 3 of 17 slices shown (1 of 2)]
[im 4/17]
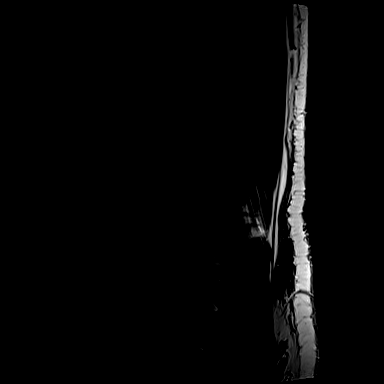
[im 10/17]
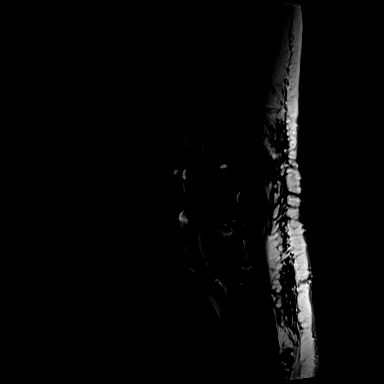
[im 17/17]
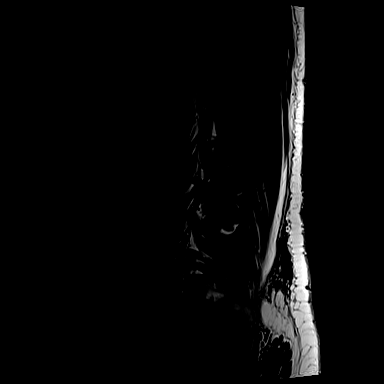

[Series 11: T1 · axial · 4.0mm · 0.28mm/px · z∈[-67,+103]mm · 3 of 43 slices shown (2 of 2)]
[im 7/43]
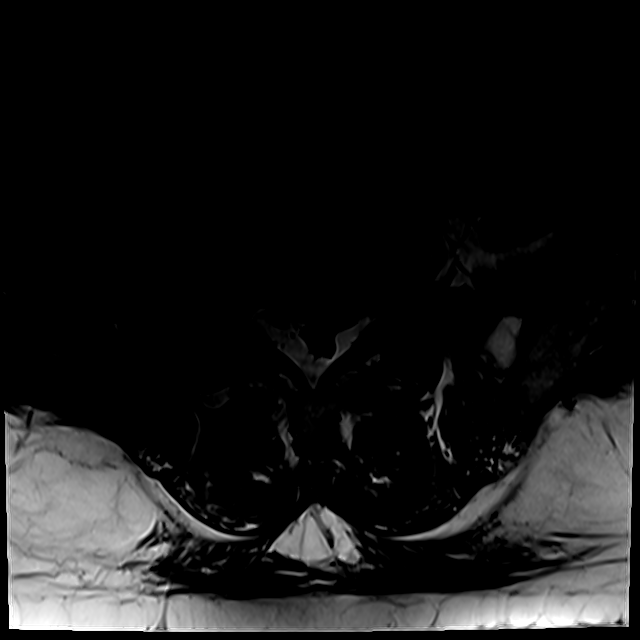
[im 22/43]
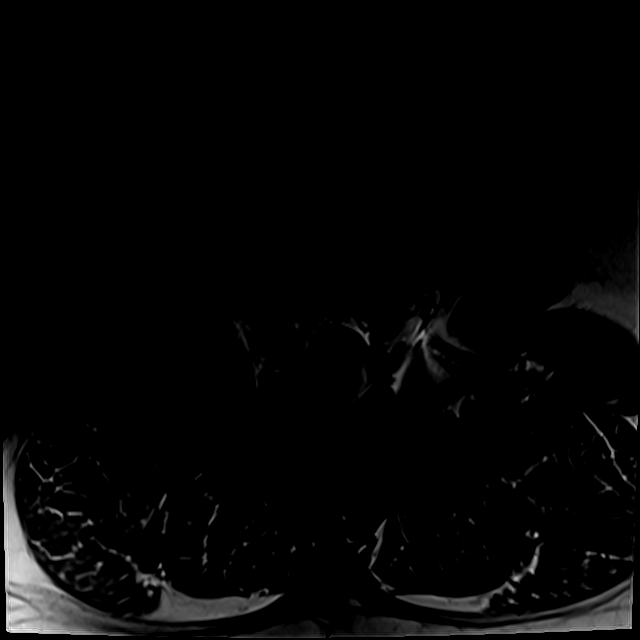
[im 37/43]
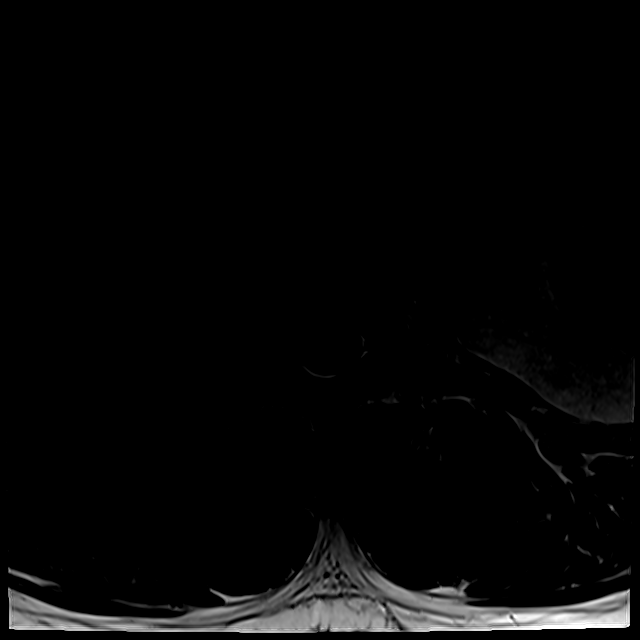

[Series 14: T2 · axial · 4.0mm · 0.28mm/px · z∈[-96,+103]mm · 6 of 43 slices shown (2 of 2)]
[im 1/43]
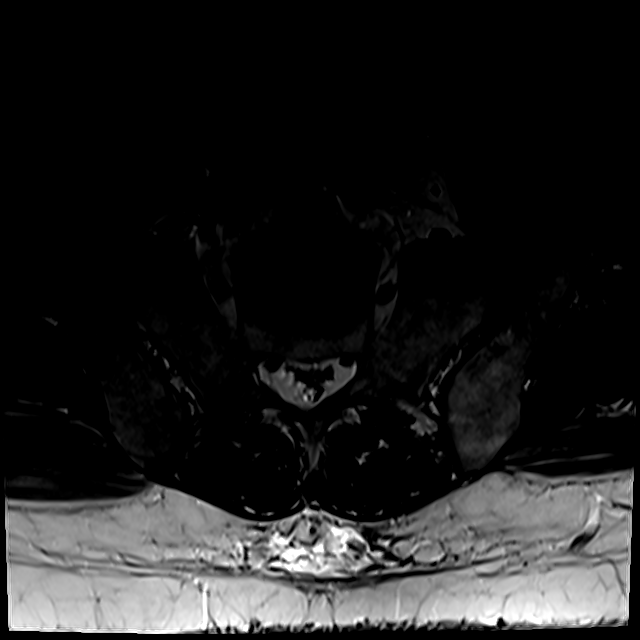
[im 7/43]
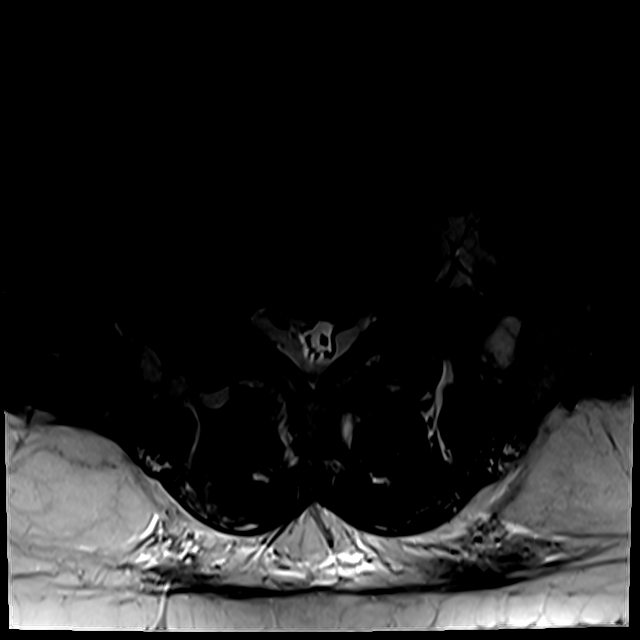
[im 13/43]
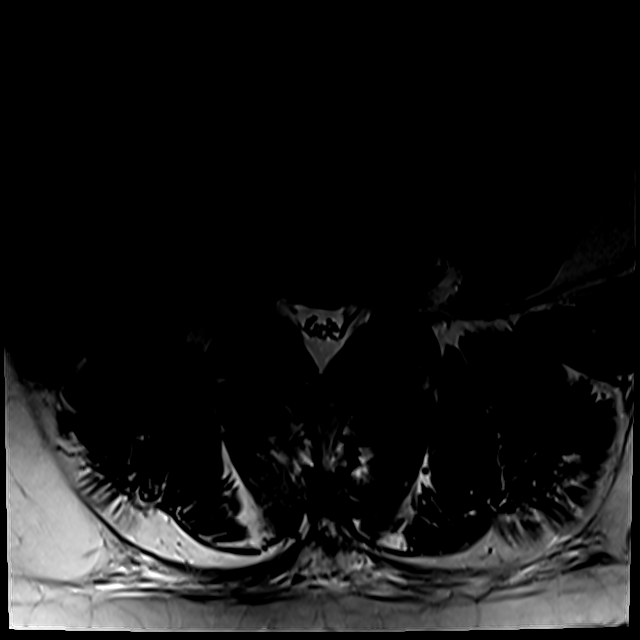
[im 19/43]
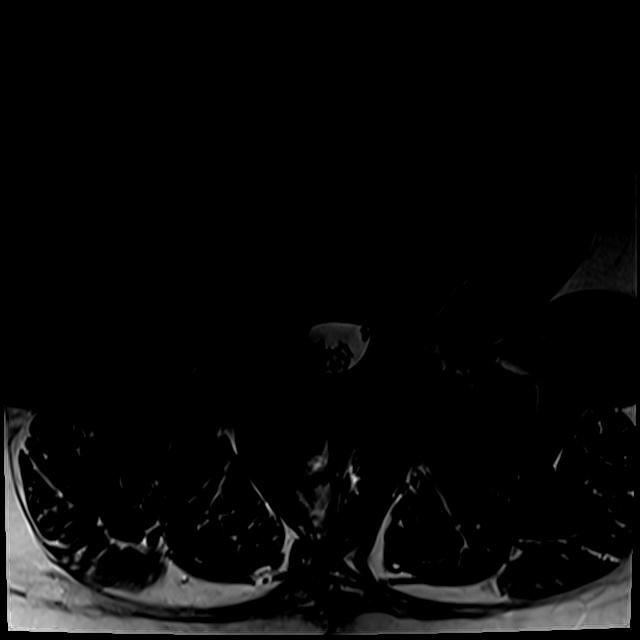
[im 22/43]
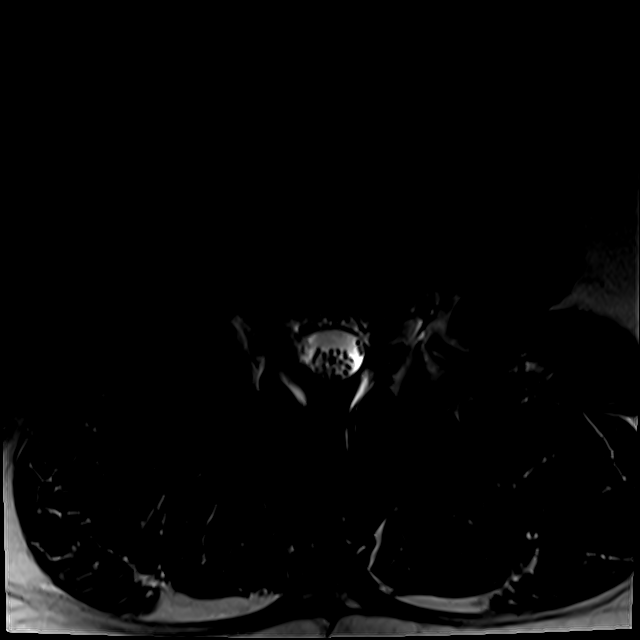
[im 37/43]
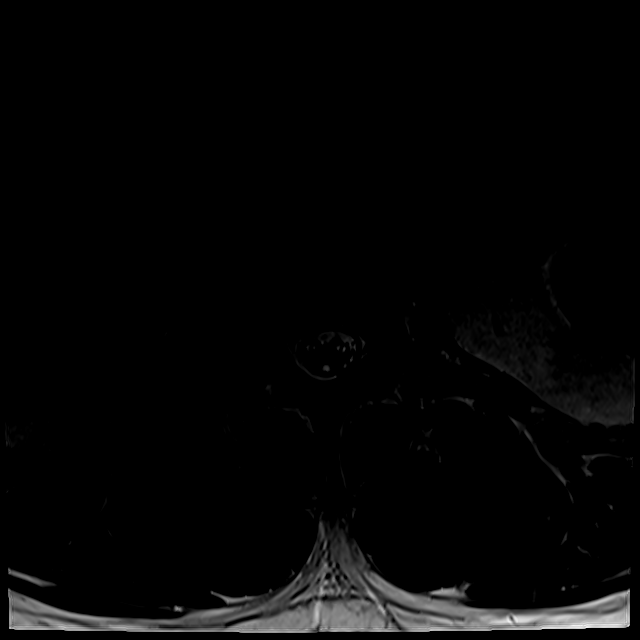

[18 of 48 positions shown; findings below may reference images not displayed]

FINDINGS: Segmentation: Conventional anatomy assumed, with the last open disc
space designated L5-S1.Concordant with previous imaging.

Alignment: Stable slight retrolisthesis at L2-3 and mild
straightening.

Vertebrae: No worrisome osseous lesion, acute fracture or pars
defect. Interval laminectomy and PLIF at L3-4 without associated
suspicious marrow signal abnormalities. The visualized sacroiliac
joints appear unremarkable.

Conus medullaris: Extends to the L1 level and appears normal.

Paraspinal and other soft tissues: No significant paraspinal
findings.

Disc levels:

No significant disc space findings at T11-12 or T12-L1.

L1-2: Disc height and hydration are maintained. Minimal disc
bulging. No spinal stenosis or nerve root encroachment.

L2-3: Stable loss of disc height with annular disc bulging, facet
and ligamentous hypertrophy. Stable mild spinal stenosis. The
lateral recesses and foramina are sufficiently patent.

L3-4: Interval laminectomy and PLIF with good decompression of the
spinal canal and lateral recesses. There is improved patency of the
foramina without evidence of L3 nerve root encroachment.

L4-5: Stable loss of disc height with annular disc bulging, facet
and ligamentous hypertrophy. Stable borderline spinal stenosis with
mild foraminal narrowing bilaterally. No nerve root encroachment.

L5-S1: Stable chronic loss of disc height with annular disc bulging
and endplate osteophytes asymmetric to the right. Mild facet and
ligamentous hypertrophy. Stable chronic right-greater-than-left
foraminal and lateral recess narrowing.
IMPRESSION: 1. Interval laminectomy and PLIF at L3-4 with improved patency of
the spinal canal and neural foramina. No demonstrated complications.
2. The additional disc space levels appear unchanged, without acute
findings. There is mild multifactorial spinal stenosis at L2-3 and
chronic right-greater-than-left foraminal and lateral recess
narrowing at L5-S1.

## 2021-09-19 IMAGING — MR MR FOOT*L* W/O CM
5 series · 40 of 40 positions shown · non-contrast
Comparison: Left foot radiographs [DATE]

CLINICAL DATA: Chronic foot pain.

EXAM:
MRI OF THE LEFT FOOT WITHOUT CONTRAST
TECHNIQUE: Multiplanar, multisequence MR imaging of the left foot was
performed. No intravenous contrast was administered.

[Series 5: T1 · coronal · left · 3.0mm · 0.38mm/px · 12 of 55 slices shown (1 of 2)]
[im 1/55]
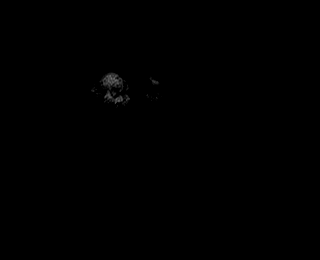
[im 5/55]
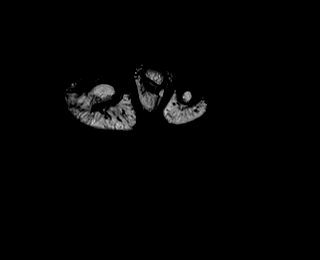
[im 10/55]
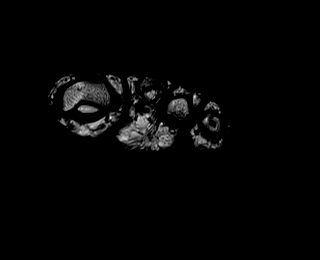
[im 15/55]
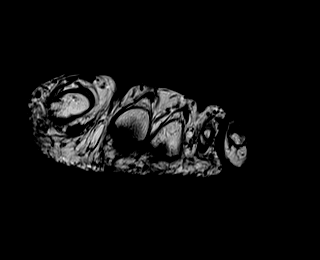
[im 20/55]
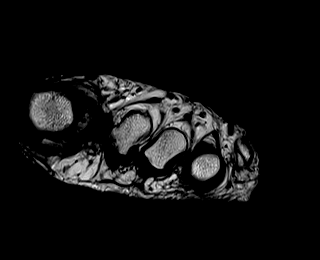
[im 25/55]
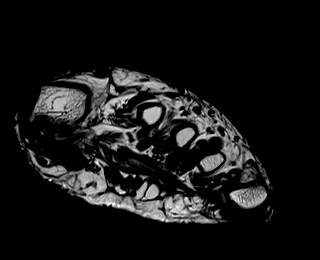
[im 30/55]
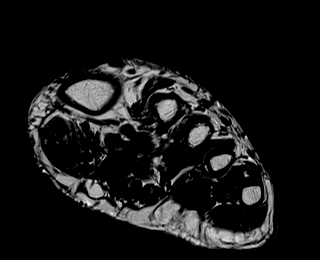
[im 35/55]
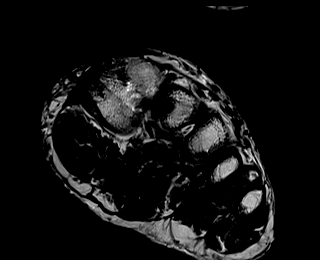
[im 40/55]
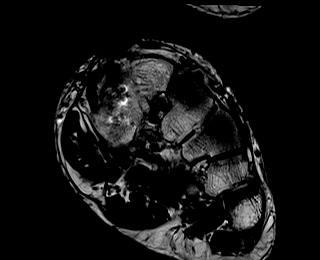
[im 45/55]
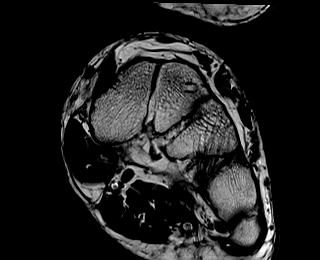
[im 50/55]
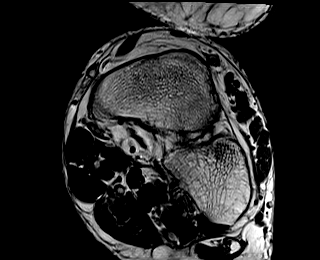
[im 55/55]
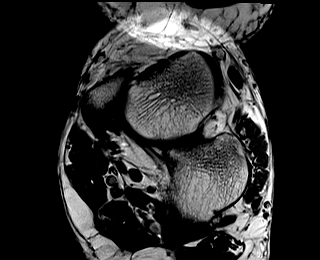

[Series 6: T2 fat-sat · coronal · left · 3.0mm · 0.38mm/px · 12 of 55 slices shown (1 of 2)]
[im 1/55]
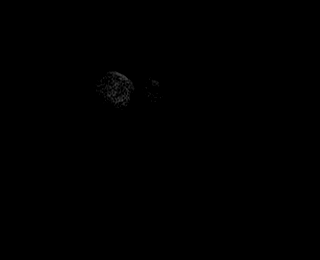
[im 5/55]
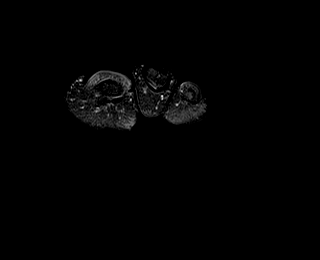
[im 10/55]
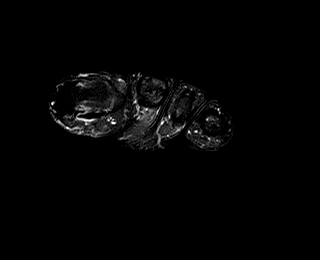
[im 15/55]
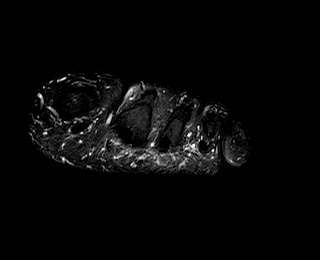
[im 20/55]
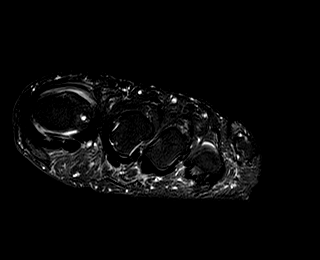
[im 25/55]
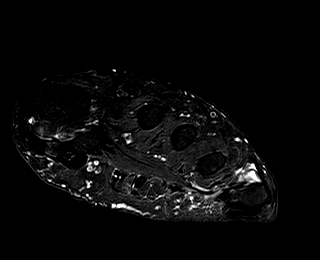
[im 30/55]
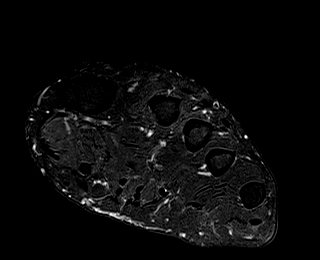
[im 35/55]
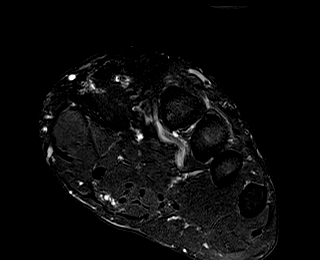
[im 40/55]
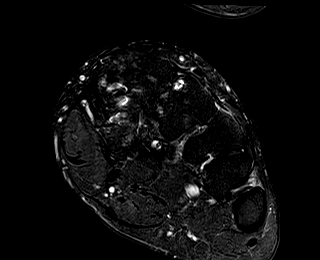
[im 45/55]
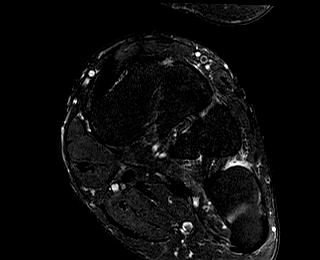
[im 50/55]
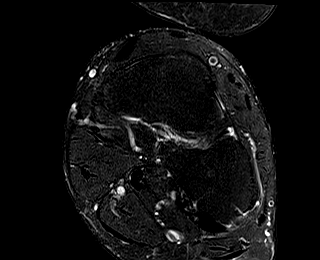
[im 55/55]
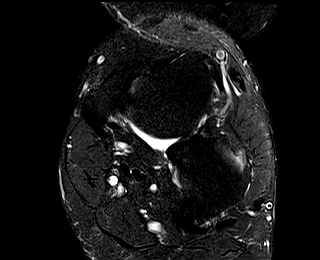

[Series 7: STIR · sagittal · left · 3.0mm · 0.86mm/px · 6 of 30 slices shown]
[im 1/30]
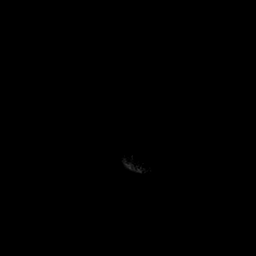
[im 6/30]
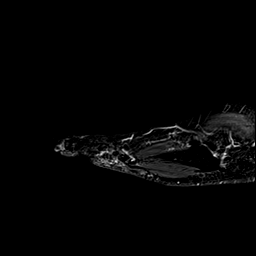
[im 12/30]
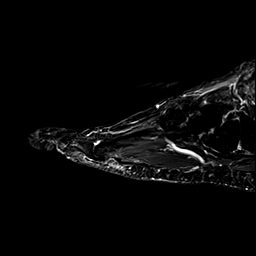
[im 18/30]
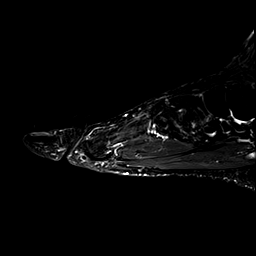
[im 24/30]
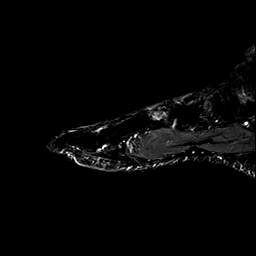
[im 30/30]
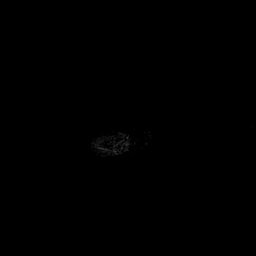

[Series 8: T1 · axial · left · 3.0mm · 0.69mm/px · z∈[-173,-100]mm · 5 of 23 slices shown (2 of 2)]
[im 1/23]
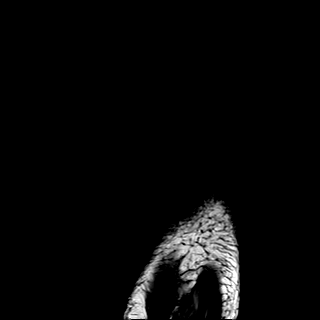
[im 6/23]
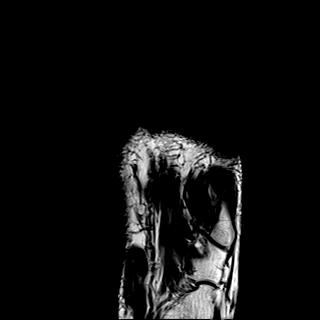
[im 12/23]
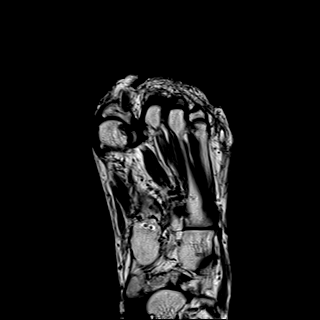
[im 17/23]
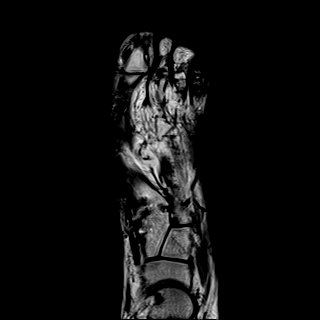
[im 23/23]
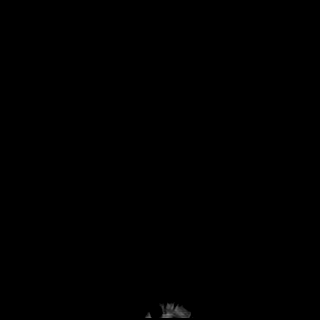

[Series 9: T2 fat-sat · axial · left · 3.0mm · 0.69mm/px · z∈[-173,-100]mm · 5 of 23 slices shown (2 of 2)]
[im 1/23]
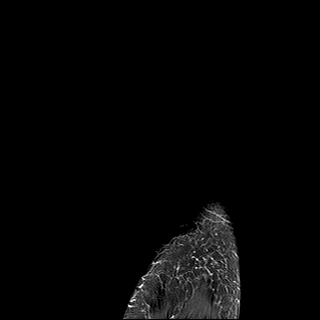
[im 6/23]
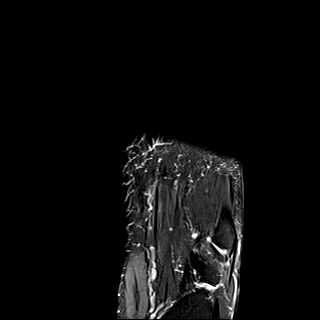
[im 12/23]
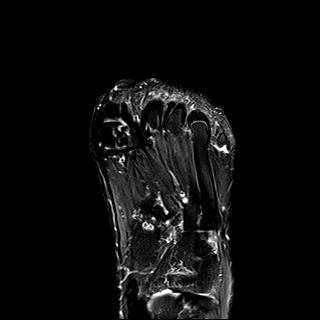
[im 17/23]
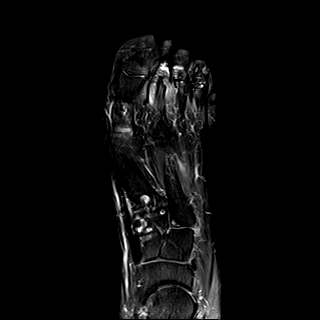
[im 23/23]
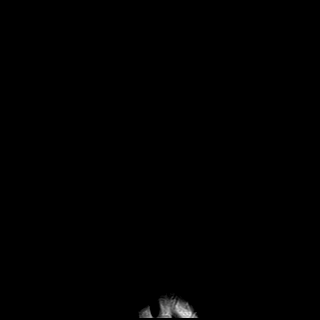

[40 of 40 positions shown; findings below may reference images not displayed]

FINDINGS: Postoperative changes related to prior fusion at the medial
cuneiform first metatarsal base joint. There are some areas of solid
osseous union. Evidence of prior hardware removal.

Moderate degenerative changes at the first MTP joint with hallux
valgus deformity.

Postoperative changes involving the second, third and fourth toes
with probable partial fusion at the proximal interphalangeal joints.

No acute bony findings are identified. No findings suspicious for
osteomyelitis or septic arthritis. The major ligaments and tendons
appear grossly intact. The foot musculature is unremarkable.
IMPRESSION: 1. Postoperative changes related to prior fusion at the medial
cuneiform first metatarsal joint. Evidence of prior hardware
removal.
2. Proximal interphalangeal joint effusions at the second, third and
fourth toes.
3. No findings for osteomyelitis or septic arthritis.
4. Moderate degenerative changes at the first MTP joint with hallux
valgus deformity.

## 2021-09-21 ENCOUNTER — Ambulatory Visit (INDEPENDENT_AMBULATORY_CARE_PROVIDER_SITE_OTHER): Payer: Medicaid Other | Admitting: Podiatry

## 2021-09-21 ENCOUNTER — Other Ambulatory Visit: Payer: Self-pay

## 2021-09-21 DIAGNOSIS — M19272 Secondary osteoarthritis, left ankle and foot: Secondary | ICD-10-CM | POA: Diagnosis not present

## 2021-09-21 DIAGNOSIS — M960 Pseudarthrosis after fusion or arthrodesis: Secondary | ICD-10-CM

## 2021-09-21 DIAGNOSIS — M2042 Other hammer toe(s) (acquired), left foot: Secondary | ICD-10-CM

## 2021-09-21 DIAGNOSIS — M2012 Hallux valgus (acquired), left foot: Secondary | ICD-10-CM | POA: Diagnosis not present

## 2021-09-21 DIAGNOSIS — M21612 Bunion of left foot: Secondary | ICD-10-CM

## 2021-09-25 ENCOUNTER — Encounter: Payer: Self-pay | Admitting: Podiatry

## 2021-09-25 NOTE — Progress Notes (Signed)
?Subjective:  ?Patient ID: Domani Bakos, male    DOB: October 15, 1965,  MRN: [id number] ? ?Chief Complaint  ?Patient presents with  ? Foot Pain  ?  MRI results   ? ? ? ?56 y.o. male returns for post-op check.  Returns today for surgical planning visit following his MRI completion ? ?Review of Systems: Negative except as noted in the HPI. Denies N/V/F/Ch. Swelling improving ? ? ?Objective:  ?There were no vitals filed for this visit. ?There is no height or weight on file to calculate BMI. ?Constitutional Well developed. ?Well nourished.  ?Vascular Foot warm and well perfused. ?Capillary refill normal to all digits.   ?Neurologic Normal speech. ?Oriented to person, place, and time. ?Epicritic sensation to light touch grossly present bilaterally.  ?Dermatologic Skin healing well without signs of infection. Skin edges well coapted without signs of infection.  ?Orthopedic: He still has moderate pain around the second and third MTPJ's and in the first interspace as well as the medial hallux  ? ?Multiple view plain film radiographs: Successful removal of all hardware, new films taken today show no abnormalities ? ?IMPRESSION: ?1. Postoperative changes related to prior fusion at the medial ?cuneiform first metatarsal joint. Evidence of prior hardware ?removal. ?2. Proximal interphalangeal joint effusions at the second, third and ?fourth toes. ?3. No findings for osteomyelitis or septic arthritis. ?4. Moderate degenerative changes at the first MTP joint with hallux ?valgus deformity. ?  ?  ?Electronically Signed ?  By: Rudie Meyer M.D. ?  On: 09/19/2021 16:35 ?Assessment:  ? ?1. Hallux valgus with bunions, left   ?2. Other secondary osteoarthritis of left foot   ?3. 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 ? ? ? ? ?Plan:  ?Patient was evaluated and treated and all questions answered. ? ?S/p foot surgery left ? ?-We reviewed the results of the MRI.  I discussed with him that I think for correcting the bunion  I would recommend a first metatarsal phalangeal joint arthrodesis, he has findings of osteoarthritis in the joint and I think is likely give him the best correction possible without recurrence.  His second and third MTPJ's are free of osteoarthritis.  There is poor healing of the interphalangeal joint fusion I recommended revision of this as well.  Bone graft will be taken from the heel for the arthrodesis sites.  I discussed with him that with his chronic pain there is a chance that he is never fully pain-free and some of the neuritic pain may not improve even with revision surgery.  He understands and wishes to proceed.  We discussed all risk benefits and potential complications including but not limited to pain, swelling, infection, scar, numbness which may be temporary or permanent, chronic pain, stiffness, nerve pain or damage, wound healing problems, bone healing problems including delayed or non-union.  Informed consent was signed and reviewed.  Surgery be scheduled mutually agreeable date. ? ? ?Surgical plan: ? ?Procedure: ?-Left foot first metatarsophalangeal joint arthrodesis with bone graft from heel, second and third hammertoe revision ? ?Location: ?-[facility] ? ?Anesthesia plan: ?-IV sedation with regional block ? ?Postoperative pain plan: ?- Tylenol 1000 mg every 6 hours, ibuprofen 600 mg every 6 hours, gabapentin 300 mg every 8 hours x5 days, oxycodone 5 mg 1-2 tabs every 6 hours only as needed ? ?DVT prophylaxis: ?-ASA 325 mg ? ?WB Restrictions / DME needs: ?-NWB in CAM boot postop this to be dispensed at hospital ? ? ?Return for after surgery  or if further questions/issues.  ? ?

## 2021-10-11 ENCOUNTER — Other Ambulatory Visit: Payer: Self-pay

## 2021-10-11 ENCOUNTER — Encounter (HOSPITAL_BASED_OUTPATIENT_CLINIC_OR_DEPARTMENT_OTHER): Payer: Self-pay | Admitting: Emergency Medicine

## 2021-10-11 ENCOUNTER — Emergency Department (HOSPITAL_BASED_OUTPATIENT_CLINIC_OR_DEPARTMENT_OTHER)
Admission: EM | Admit: 2021-10-11 | Discharge: 2021-10-11 | Disposition: A | Discharge status: Home / Self Care | Payer: Medicaid Other | Attending: Emergency Medicine | Admitting: Emergency Medicine

## 2021-10-11 ENCOUNTER — Emergency Department (HOSPITAL_BASED_OUTPATIENT_CLINIC_OR_DEPARTMENT_OTHER): Payer: Medicaid Other

## 2021-10-11 DIAGNOSIS — S6991XA Unspecified injury of right wrist, hand and finger(s), initial encounter: Secondary | ICD-10-CM | POA: Diagnosis present

## 2021-10-11 DIAGNOSIS — Y9367 Activity, basketball: Secondary | ICD-10-CM | POA: Diagnosis not present

## 2021-10-11 DIAGNOSIS — W2105XA Struck by basketball, initial encounter: Secondary | ICD-10-CM | POA: Diagnosis not present

## 2021-10-11 IMAGING — DX DG HAND COMPLETE 3+V*R*
3 series · 3 of 3 positions shown · non-contrast
Comparison: None.

CLINICAL DATA: Right thumb pain status post basketball jamming
injury and hyperextension thumb.

EXAM:
RIGHT HAND - COMPLETE 3+ VIEW

[hand pa]
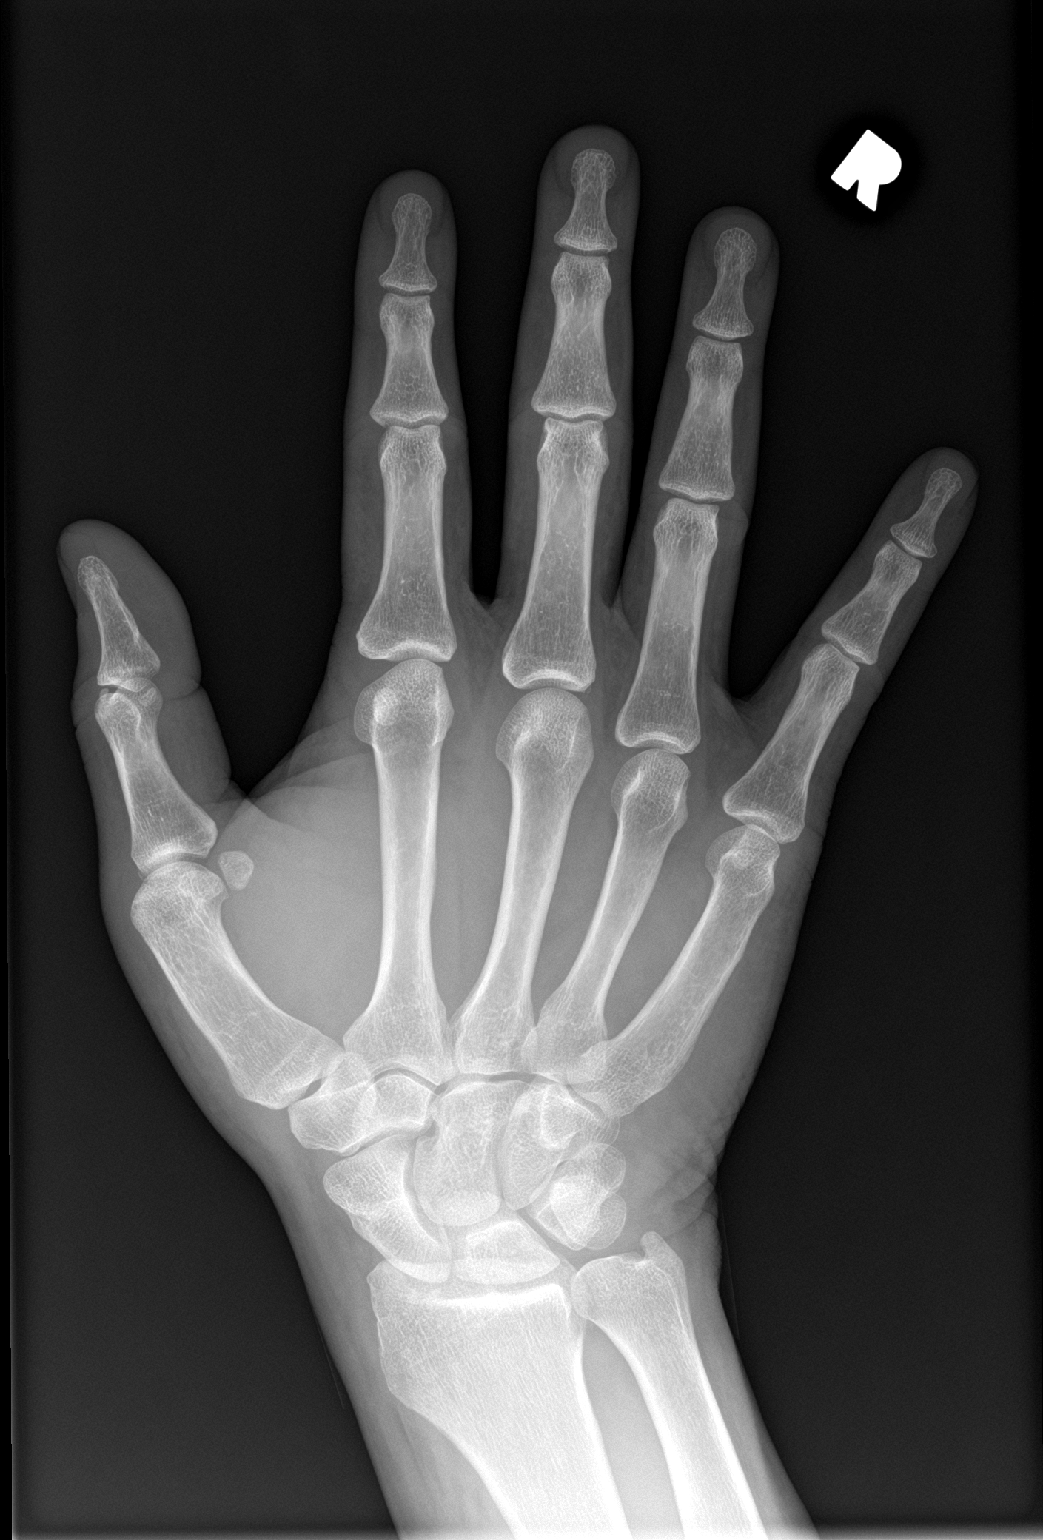

[hand obl]
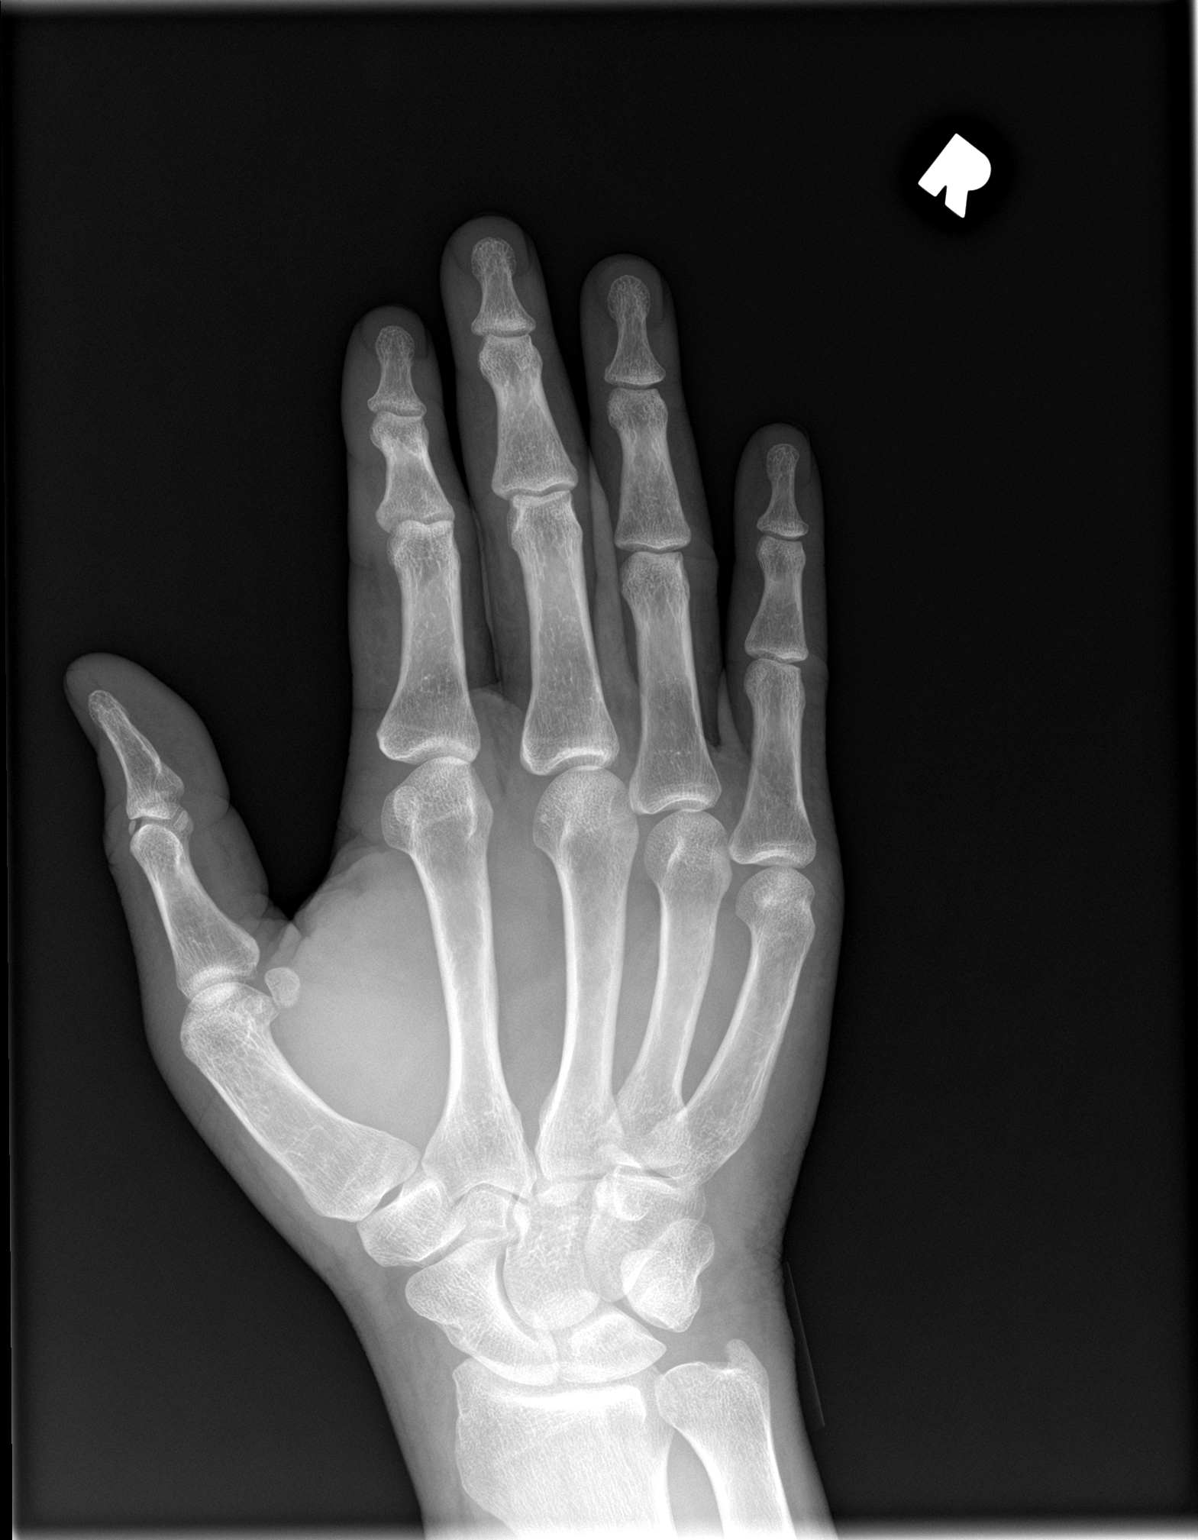

[hand lat]
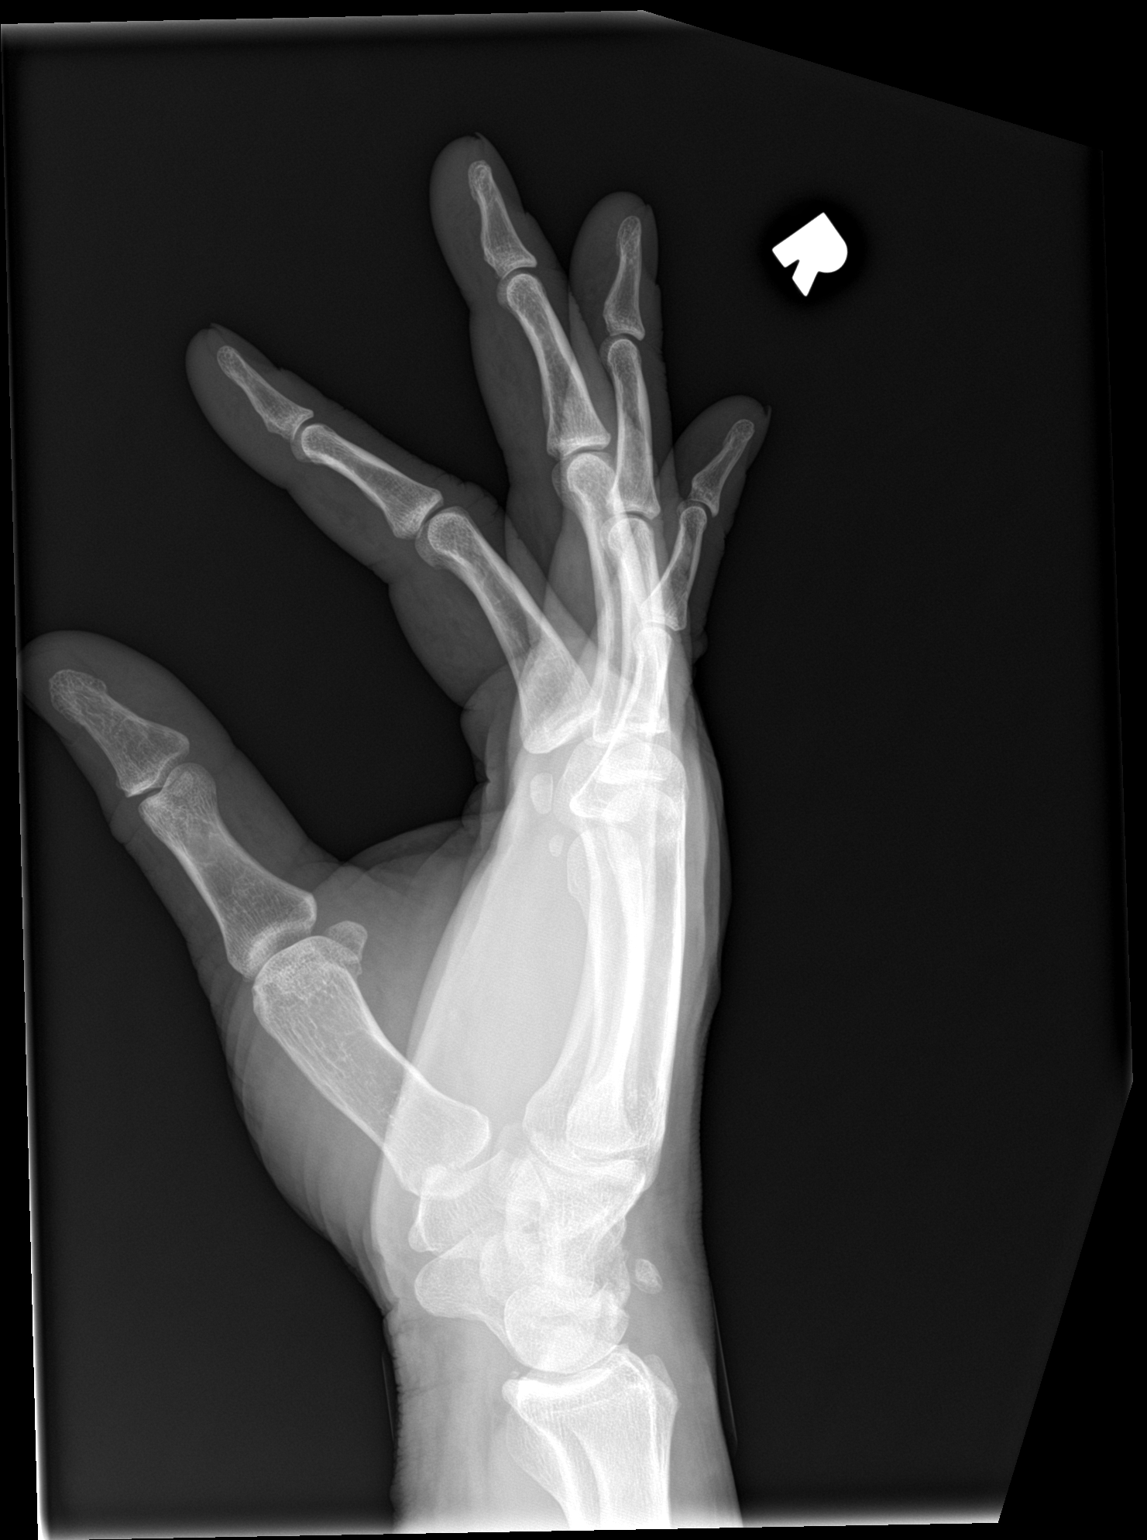

[3 of 3 positions shown; findings below may reference images not displayed]

FINDINGS: There is no evidence of fracture or dislocation. There is no
evidence of arthropathy or other focal bone abnormality. Soft
tissues are unremarkable.
IMPRESSION: No acute osseous abnormality.

## 2021-10-11 NOTE — ED Triage Notes (Signed)
Reports he fell on his right hand playing basketball last night.  Now having pain in the thumb. ?

## 2021-10-11 NOTE — Discharge Instructions (Signed)
Ice your thumb and take ibuprofen when needed at home.  Return with any worsening symptoms ?

## 2021-10-11 NOTE — ED Provider Notes (Signed)
?[facility] EMERGENCY DEPARTMENT ?Provider Note ? ? ?CSN: [id number] ?Arrival date & time: 10/11/21  0901 ? ?  ? ?History ? ?Chief Complaint  ?Patient presents with  ? thumb pain  ? ? ?Caleb Hunter is a 56 y.o. male presenting with right thumb pain.  This began last night after playing basketball.  Someone threw the ball and it hit his thumb straight on.  Denies any numbness or tingling.  Says that it hurts at the MCP joint.  Still able to move the thumb however reports it is swollen.  Took ibuprofen with some relief.  Wants to make sure it is not broken. ? ? ?HPI ? ?  ? ?Home Medications ?Prior to Admission medications   ?Medication Sig Start Date End Date Taking? Authorizing Provider  ?albuterol (PROVENTIL) (2.5 MG/3ML) 0.083% nebulizer solution Take 2.5 mg by nebulization every 6 (six) hours as needed for wheezing or shortness of breath.    [provider]  ?albuterol (VENTOLIN HFA) 108 (90 Base) MCG/ACT inhaler Inhale 2 puffs into the lungs every 4 (four) hours as needed for shortness of breath or wheezing.    [provider]  ?allopurinol (ZYLOPRIM) 300 MG tablet Take 300 mg by mouth daily as needed (gout). 12/14/20   [provider]  ?amLODipine (NORVASC) 10 MG tablet Take 10 mg by mouth daily. 10/21/17   [provider]  ?atenolol-chlorthalidone (TENORETIC) 50-25 MG tablet Take 1 tablet by mouth daily.    [provider]  ?atorvastatin (LIPITOR) 80 MG tablet Take 40 mg by mouth at bedtime.    [provider]  ?budesonide-formoterol (SYMBICORT) 80-4.5 MCG/ACT inhaler Inhale 2 puffs into the lungs 2 (two) times daily. 07/27/20   [provider]  ?clobetasol cream (TEMOVATE) 0.05 % Apply 1 application topically 2 (two) times daily as needed (irritation).    [provider]  ?Colchicine 0.6 MG CAPS Take 1 tablet by mouth daily as needed (gout).    [provider]  ?EPINEPHrine 0.3 mg/0.3 mL IJ SOAJ injection Inject 0.3 mg into  the muscle as needed for anaphylaxis. 04/26/20   [provider]  ?ibuprofen (ADVIL) 800 MG tablet Take 1 tablet (800 mg total) by mouth every 8 (eight) hours as needed (Pain). 07/13/21 10/11/21  Edwin Cap, DPM  ?methylPREDNISolone (MEDROL DOSEPAK) 4 MG TBPK tablet 6 day dose pack - take as directed 07/06/21   Edwin Cap, DPM  ?montelukast (SINGULAIR) 10 MG tablet Take 10 mg by mouth at bedtime. 06/07/15   [provider]  ?Olopatadine HCl 0.2 % SOLN Place 1 drop into both eyes daily as needed (eye allergies). 12/25/19   [provider]  ?omeprazole (PRILOSEC) 20 MG capsule Take 20 mg by mouth daily. 08/01/20   [provider]  ?oxyCODONE-acetaminophen (PERCOCET) 10-325 MG tablet Take 1 tablet by mouth 4 (four) times daily as needed for pain. 08/29/20   [provider]  ?   ? ?Allergies    ?Lisinopril and Shellfish allergy   ? ?Review of Systems   ?Review of Systems ?See HPI ?Physical Exam ?Updated Vital Signs ?BP 125/85 (BP Location: Left Arm)   Pulse 71   Temp 98.3 ?F (36.8 ?C) (Oral)   Resp 18   Ht 6' 3.5" (1.918 m)   Wt 121.6 kg   SpO2 96%   BMI 33.07 kg/m?  ?Physical Exam ?Vitals and nursing note reviewed.  ?Constitutional:   ?   Appearance: Normal appearance.  ?HENT:  ?   Head:  Normocephalic and atraumatic.  ?Eyes:  ?   General: No scleral icterus. ?   Conjunctiva/sclera: Conjunctivae normal.  ?Pulmonary:  ?   Effort: Pulmonary effort is normal. No respiratory distress.  ?Musculoskeletal:     ?   General: Swelling present. Normal range of motion.  ?   Comments: Tenderness to MCP of right thumb.  Some swelling.  No deformity.  Strong radial pulse.  Full range of motion of remainder of hand  ?Skin: ?   Findings: No rash.  ?Neurological:  ?   Mental Status: He is alert.  ?Psychiatric:     ?   Mood and Affect: Mood normal.  ? ? ?ED Results / Procedures / Treatments   ?Labs ?(all labs ordered are listed, but only abnormal results are displayed) ?Labs  Reviewed - No data to display ? ?EKG ?None ? ?Radiology ?DG Hand Complete Right ? ?Result Date: 10/11/2021 ?CLINICAL DATA:  Right thumb pain status post basketball jamming injury and hyperextension thumb. EXAM: RIGHT HAND - COMPLETE 3+ VIEW COMPARISON:  None. FINDINGS: There is no evidence of fracture or dislocation. There is no evidence of arthropathy or other focal bone abnormality. Soft tissues are unremarkable. IMPRESSION: No acute osseous abnormality. Electronically Signed   By: Maudry Mayhew M.D.   On: 10/11/2021 09:52   ? ?Procedures ?Procedures  ? ? ?Medications Ordered in ED ?Medications - No data to display ? ?ED Course/ Medical Decision Making/ A&P ?  ?                        ?Medical Decision Making ?Amount and/or Complexity of Data Reviewed ?Radiology: ordered. ? ? ?55 year old male presenting with a thumb injury.  X-ray reviewed by me, negative for fracture.  He was notified of this and declines the need for medication and says he will take ibuprofen at home. ? ?Neurovascularly intact, to be discharged ? ?Final Clinical Impression(s) / ED Diagnoses ?Final diagnoses:  ?Injury of right thumb, initial encounter  ? ? ?Rx / DC Orders ?ED Discharge Orders   ? ? None  ? ?  ? ?Results and diagnoses were explained to the patient. Return precautions discussed in full. Patient had no additional questions and expressed complete understanding. ? ? ?This chart was dictated using voice recognition software.  Despite best efforts to proofread,  errors can occur which can change the documentation meaning.  ?  ?Saddie Benders, PA-C ?10/11/21 1130 ? ?  ?Alvira Monday, MD ?10/12/21 2202 ? ?

## 2021-10-31 ENCOUNTER — Telehealth: Payer: Self-pay | Admitting: Podiatry

## 2021-10-31 NOTE — Telephone Encounter (Signed)
Pt states that he would like a second opinion prior to having his surgery. He states that Dr. Lilian Kapur agreed to bring in another provider here in the office to give him a second opinion.  ? ?Please advise ?

## 2021-11-15 NOTE — Telephone Encounter (Signed)
Pt has been contacted and scheduled with Dr Ardelle Anton ?

## 2021-11-28 ENCOUNTER — Other Ambulatory Visit: Payer: Self-pay | Admitting: Podiatry

## 2021-11-28 ENCOUNTER — Ambulatory Visit (INDEPENDENT_AMBULATORY_CARE_PROVIDER_SITE_OTHER): Payer: Medicaid Other | Admitting: Podiatry

## 2021-11-28 ENCOUNTER — Ambulatory Visit (INDEPENDENT_AMBULATORY_CARE_PROVIDER_SITE_OTHER): Payer: Medicaid Other

## 2021-11-28 DIAGNOSIS — M21612 Bunion of left foot: Secondary | ICD-10-CM

## 2021-11-28 DIAGNOSIS — M2012 Hallux valgus (acquired), left foot: Secondary | ICD-10-CM

## 2021-11-28 MED ORDER — IBUPROFEN 800 MG PO TABS: 800.0000 mg | ORAL_TABLET | Freq: Three times a day (TID) | ORAL | 0 refills | Status: AC | PRN

## 2021-12-05 NOTE — Progress Notes (Signed)
Subjective: 56 year old male presents the office today for second opinion in regards to upcoming surgery with Dr. Sherryle Lis.  He previously had a Lapidus bunionectomy followed by hardware removal.  He is continue to have discomfort mostly in the first MPJ but upon asking where his pain is at he points on the second third metatarsal heads.  No recent injuries.  He has had some burning to the foot as well which seem to worsen since the surgery but he points along the MPJs of the second and third radiosurgery to the burning sensation.  Objective: AAO x3, NAD DP/PT pulses palpable bilaterally, CRT less than 3 seconds There is residual bunion deformity and there is decreased range of motion of the first MPJ on the left foot.  There is tenderness palpation along second third MPJs.  No palpable neuroma.  There is trace edema to the foot but there is no erythema or warmth.  The scars of prior surgery are well-healed.  MMT 5/5.  No Tinel sign. No pain with calf compression, swelling, warmth, erythema  Assessment: Arthritic changes first MPJ with bunion; metatarsalgia  Plan: -All treatment options discussed with the patient including all alternatives, risks, complications.  -I again reviewed the x-rays with the patient.  Discussed with conservative as well as surgical treatment options.  I do think that further surgery is needed to help correct this.  I discussed with him first MPJ arthrodesis but also second and third metatarsal shortening osteotomies.  After discussion it seems that he wants to proceed with this.  I discussed the case with Dr. Sherryle Lis who also evaluated and saw the patient today.  Will defer to Dr. Sherryle Lis for further surgery. -Patient encouraged to call the office with any questions, concerns, change in symptoms.   Trula Slade DPM

## 2022-01-30 ENCOUNTER — Emergency Department (HOSPITAL_BASED_OUTPATIENT_CLINIC_OR_DEPARTMENT_OTHER)
Admission: EM | Admit: 2022-01-30 | Discharge: 2022-01-30 | Disposition: A | Discharge status: Home / Self Care | Payer: Medicaid Other | Attending: Emergency Medicine | Admitting: Emergency Medicine

## 2022-01-30 ENCOUNTER — Other Ambulatory Visit (HOSPITAL_BASED_OUTPATIENT_CLINIC_OR_DEPARTMENT_OTHER): Payer: Self-pay

## 2022-01-30 ENCOUNTER — Other Ambulatory Visit: Payer: Self-pay

## 2022-01-30 ENCOUNTER — Emergency Department (HOSPITAL_BASED_OUTPATIENT_CLINIC_OR_DEPARTMENT_OTHER): Payer: Medicaid Other

## 2022-01-30 ENCOUNTER — Encounter (HOSPITAL_BASED_OUTPATIENT_CLINIC_OR_DEPARTMENT_OTHER): Payer: Self-pay | Admitting: Emergency Medicine

## 2022-01-30 DIAGNOSIS — J4541 Moderate persistent asthma with (acute) exacerbation: Secondary | ICD-10-CM | POA: Insufficient documentation

## 2022-01-30 DIAGNOSIS — Z7951 Long term (current) use of inhaled steroids: Secondary | ICD-10-CM | POA: Diagnosis not present

## 2022-01-30 DIAGNOSIS — R0602 Shortness of breath: Secondary | ICD-10-CM | POA: Diagnosis present

## 2022-01-30 MED ORDER — ALBUTEROL SULFATE (2.5 MG/3ML) 0.083% IN NEBU
INHALATION_SOLUTION | RESPIRATORY_TRACT | Status: AC
  Administered 2022-01-30: 2.5 mg via RESPIRATORY_TRACT
  Filled 2022-01-30: qty 3

## 2022-01-30 MED ORDER — IPRATROPIUM-ALBUTEROL 0.5-2.5 (3) MG/3ML IN SOLN
RESPIRATORY_TRACT | Status: AC
  Administered 2022-01-30: 3 mL via RESPIRATORY_TRACT
  Filled 2022-01-30: qty 3

## 2022-01-30 MED ORDER — ALBUTEROL SULFATE (2.5 MG/3ML) 0.083% IN NEBU: 2.5000 mg | INHALATION_SOLUTION | Freq: Once | RESPIRATORY_TRACT | Status: AC

## 2022-01-30 MED ORDER — IPRATROPIUM-ALBUTEROL 0.5-2.5 (3) MG/3ML IN SOLN: 3.0000 mL | Freq: Once | RESPIRATORY_TRACT | Status: AC

## 2022-01-30 MED ORDER — PREDNISONE 10 MG PO TABS
40.0000 mg | ORAL_TABLET | Freq: Every day | ORAL | 0 refills | Status: AC
  Filled 2022-01-30: qty 16, 4d supply, fill #0

## 2022-01-30 MED ORDER — PREDNISONE 50 MG PO TABS
60.0000 mg | ORAL_TABLET | Freq: Once | ORAL | Status: AC
  Administered 2022-01-30: 60 mg via ORAL
  Filled 2022-01-30: qty 1

## 2022-01-30 MED ORDER — IPRATROPIUM-ALBUTEROL 0.5-2.5 (3) MG/3ML IN SOLN
3.0000 mL | RESPIRATORY_TRACT | Status: AC
  Administered 2022-01-30 (×2): 3 mL via RESPIRATORY_TRACT
  Filled 2022-01-30: qty 6

## 2022-01-30 NOTE — ED Provider Notes (Signed)
[facility] EMERGENCY DEPARTMENT Provider Note   CSN: [id number] Arrival date & time: 01/30/22  4332     History  Chief Complaint  Patient presents with   URI    Caleb Hunter is a 56 y.o. male.  55 yo M with a chief complaint of difficulty breathing.  Patient tells that this happens with his asthma every now and again.  He typically has come to the hospital and get steroids.  He has been using his inhaler much more often than normal.  Going on for about 3 to 4 days.  Denies significant cough denies fevers.  Denies sick contacts.   URI      Home Medications Prior to Admission medications   Medication Sig Start Date End Date Taking? Authorizing Provider  predniSONE (DELTASONE) 10 MG tablet Take 4 tablets (40 mg total) by mouth daily for 4 days. 01/30/22 02/03/22 Yes Melene Plan, DO  albuterol (PROVENTIL) (2.5 MG/3ML) 0.083% nebulizer solution Take 2.5 mg by nebulization every 6 (six) hours as needed for wheezing or shortness of breath.    [provider]  albuterol (VENTOLIN HFA) 108 (90 Base) MCG/ACT inhaler Inhale 2 puffs into the lungs every 4 (four) hours as needed for shortness of breath or wheezing.    [provider]  allopurinol (ZYLOPRIM) 300 MG tablet Take 300 mg by mouth daily as needed (gout). 12/14/20   [provider]  amLODipine (NORVASC) 10 MG tablet Take 10 mg by mouth daily. 10/21/17   [provider]  atenolol-chlorthalidone (TENORETIC) 50-25 MG tablet Take 1 tablet by mouth daily.    [provider]  atorvastatin (LIPITOR) 80 MG tablet Take 40 mg by mouth at bedtime.    [provider]  budesonide-formoterol (SYMBICORT) 80-4.5 MCG/ACT inhaler Inhale 2 puffs into the lungs 2 (two) times daily. 07/27/20   [provider]  clobetasol cream (TEMOVATE) 0.05 % Apply 1 application topically 2 (two) times daily as needed (irritation).    [provider]  Colchicine 0.6 MG CAPS Take 1 tablet by  mouth daily as needed (gout).    [provider]  EPINEPHrine 0.3 mg/0.3 mL IJ SOAJ injection Inject 0.3 mg into the muscle as needed for anaphylaxis. 04/26/20   [provider]  methylPREDNISolone (MEDROL DOSEPAK) 4 MG TBPK tablet 6 day dose pack - take as directed 07/06/21   Edwin Cap, DPM  montelukast (SINGULAIR) 10 MG tablet Take 10 mg by mouth at bedtime. 06/07/15   [provider]  Olopatadine HCl 0.2 % SOLN Place 1 drop into both eyes daily as needed (eye allergies). 12/25/19   [provider]  omeprazole (PRILOSEC) 20 MG capsule Take 20 mg by mouth daily. 08/01/20   [provider]  oxyCODONE-acetaminophen (PERCOCET) 10-325 MG tablet Take 1 tablet by mouth 4 (four) times daily as needed for pain. 08/29/20   [provider]      Allergies    Lisinopril and Shellfish allergy    Review of Systems   Review of Systems  Physical Exam Updated Vital Signs BP (!) 149/100   Pulse 74   Temp 98.4 F (36.9 C) (Oral)   Resp 15   Ht 6\' 4"  (1.93 m)   Wt 123.4 kg   SpO2 100%   BMI 33.11 kg/m  Physical Exam Vitals and nursing note reviewed.  Constitutional:      Appearance: He is well-developed.  HENT:     Head: Normocephalic and atraumatic.  Eyes:  Pupils: Pupils are equal, round, and reactive to light.  Neck:     Vascular: No JVD.  Cardiovascular:     Rate and Rhythm: Normal rate and regular rhythm.     Heart sounds: No murmur heard.    No friction rub. No gallop.  Pulmonary:     Effort: No respiratory distress.     Breath sounds: Wheezing present.     Comments: Prolonged expiratory effort with wheezes. Abdominal:     General: There is no distension.     Tenderness: There is no abdominal tenderness. There is no guarding or rebound.  Musculoskeletal:        General: Normal range of motion.     Cervical back: Normal range of motion and neck supple.  Skin:    Coloration: Skin is not pale.     Findings: No rash.   Neurological:     Mental Status: He is alert and oriented to person, place, and time.  Psychiatric:        Behavior: Behavior normal.     ED Results / Procedures / Treatments   Labs (all labs ordered are listed, but only abnormal results are displayed) Labs Reviewed - No data to display  EKG None  Radiology No results found.  Procedures Procedures    Medications Ordered in ED Medications  ipratropium-albuterol (DUONEB) 0.5-2.5 (3) MG/3ML nebulizer solution 3 mL (3 mLs Nebulization Given 01/30/22 0653)  albuterol (PROVENTIL) (2.5 MG/3ML) 0.083% nebulizer solution 2.5 mg (2.5 mg Nebulization Given 01/30/22 0653)  ipratropium-albuterol (DUONEB) 0.5-2.5 (3) MG/3ML nebulizer solution 3 mL (3 mLs Nebulization Given 01/30/22 0725)  predniSONE (DELTASONE) tablet 60 mg (60 mg Oral Given 01/30/22 1761)    ED Course/ Medical Decision Making/ A&P                           Medical Decision Making Amount and/or Complexity of Data Reviewed Radiology: ordered.  Risk Prescription drug management.   56 yo M with chief complaints of difficulty breathing.  Going on for the better part of a week.  Feels like his prior asthma.  Typically needs steroids when he gets to this point.  On my exam the patient has mild diminished aeration with prolonged expiratory effort and expiratory wheezes.  He did have been giving a DuoNeb and albuterol and did not feel significantly better.  We will give 2 more DuoNebs and a dose of prednisone and reassess.  Patient feeling a bit better on reassessment.  Improved aeration on lung exam.  He would like to go home.  Burst of steroids.  PCP follow-up.   Chest x-ray independently interpreted by me with peribronchial thickening more consistent with bronchitis.  No focal infiltrate.  No pneumothorax.  7:49 AM:  I have discussed the diagnosis/risks/treatment options with the patient.  Evaluation and diagnostic testing in the emergency department does not suggest an  emergent condition requiring admission or immediate intervention beyond what has been performed at this time.  They will follow up with  PCP. We also discussed returning to the ED immediately if new or worsening sx occur. We discussed the sx which are most concerning (e.g., sudden worsening sob, need to use inhaler more often than every 4 hours, fever, inability to tolerate by mouth) that necessitate immediate return. Medications administered to the patient during their visit and any new prescriptions provided to the patient are listed below.  Medications given during this visit Medications  ipratropium-albuterol (DUONEB) 0.5-2.5 (3)  MG/3ML nebulizer solution 3 mL (3 mLs Nebulization Given 01/30/22 0653)  albuterol (PROVENTIL) (2.5 MG/3ML) 0.083% nebulizer solution 2.5 mg (2.5 mg Nebulization Given 01/30/22 0653)  ipratropium-albuterol (DUONEB) 0.5-2.5 (3) MG/3ML nebulizer solution 3 mL (3 mLs Nebulization Given 01/30/22 0725)  predniSONE (DELTASONE) tablet 60 mg (60 mg Oral Given 01/30/22 2947)     The patient appears reasonably screen and/or stabilized for discharge and I doubt any other medical condition or other [facility] requiring further screening, evaluation, or treatment in the ED at this time prior to discharge.         Final Clinical Impression(s) / ED Diagnoses Final diagnoses:  Moderate persistent asthma with exacerbation    Rx / DC Orders ED Discharge Orders          Ordered    predniSONE (DELTASONE) 10 MG tablet  Daily        01/30/22 0747              Melene Plan, DO 01/30/22 [phone]

## 2022-01-30 NOTE — ED Notes (Signed)
Neb treatment in process, pt reports feels little better.

## 2022-01-30 NOTE — ED Triage Notes (Signed)
Pt c/o cough and congestion x 1 week with exacerbation of asthma. Been using inhaler and neb at home without relief.

## 2022-01-30 NOTE — ED Notes (Signed)
Discharge instructions reviewed with patient. Patient verbalizes understanding, no further questions at this time. Medications/prescriptions and follow up information provided. No acute distress noted at time of departure.  

## 2022-01-30 NOTE — Discharge Instructions (Signed)
Use your inhaler every 4 hours(6 puffs) while awake, return for sudden worsening shortness of breath, or if you need to use your inhaler more often.  ° °

## 2022-02-06 ENCOUNTER — Ambulatory Visit (INDEPENDENT_AMBULATORY_CARE_PROVIDER_SITE_OTHER): Payer: Medicaid Other | Admitting: Podiatry

## 2022-02-06 DIAGNOSIS — M2012 Hallux valgus (acquired), left foot: Secondary | ICD-10-CM | POA: Diagnosis not present

## 2022-02-06 DIAGNOSIS — M21962 Unspecified acquired deformity of left lower leg: Secondary | ICD-10-CM

## 2022-02-06 DIAGNOSIS — M19272 Secondary osteoarthritis, left ankle and foot: Secondary | ICD-10-CM

## 2022-02-06 MED ORDER — METHYLPREDNISOLONE 4 MG PO TBPK: ORAL_TABLET | ORAL | 0 refills | Status: DC

## 2022-02-06 MED ORDER — IBUPROFEN 800 MG PO TABS: 800.0000 mg | ORAL_TABLET | Freq: Three times a day (TID) | ORAL | 0 refills | Status: AC | PRN

## 2022-02-07 NOTE — Progress Notes (Signed)
Subjective:  Patient ID: Caleb Hunter, male    DOB: Dec 11, 1965,  MRN: [id number]  Chief Complaint  Patient presents with   Bunions    *NEED MEDICAID ABN ANY DME* discuss up coming surgery     56 y.o. male returns for post-op check.  He is here for follow-up before his left foot surgery.  He saw Dr. Ardelle Anton at his last visit for a second opinion.  Still very painful for him feels like burning and aching across the top of the toes  Review of Systems: Negative except as noted in the HPI. Denies N/V/F/Ch. Swelling improving   Objective:  There were no vitals filed for this visit. There is no height or weight on file to calculate BMI. Constitutional Well developed. Well nourished.  Vascular Foot warm and well perfused. Capillary refill normal to all digits.   Neurologic Normal speech. Oriented to person, place, and time. Epicritic sensation to light touch grossly present bilaterally.  Dermatologic Incisions are well-healed with minimal hypertrophy  Orthopedic: He still has moderate pain around the second and third MTPJ's and in the first interspace as well as the medial hallux   Multiple view plain film radiographs: Recurrent hallux valgus and arthrosis of the first MTPJ, elongated second and third metatarsals  IMPRESSION: 1. Postoperative changes related to prior fusion at the medial cuneiform first metatarsal joint. Evidence of prior hardware removal. 2. Proximal interphalangeal joint effusions at the second, third and fourth toes. 3. No findings for osteomyelitis or septic arthritis. 4. Moderate degenerative changes at the first MTP joint with hallux valgus deformity.     Electronically Signed   By: Rudie Meyer M.D.   On: 09/19/2021 16:35 Assessment:   1. Hallux valgus with bunions, left   2. Other secondary osteoarthritis of left foot   3. Metatarsal deformity, left         Plan:  Patient was evaluated and treated and all questions answered.   -We again  reviewed his progress.  I previously have discussed this case with Dr. Ardelle Anton.  I agree with him that second third metatarsal osteotomies would be beneficial.  I do not think the PIPJ arthrodesis sites need revision at this point.  I discussed with him that with his chronic pain there is a chance that he is never fully pain-free and some of the neuritic pain may not improve even with revision surgery.  He understands and wishes to proceed.  We discussed all risk benefits and potential complications including but not limited to pain, swelling, infection, scar, numbness which may be temporary or permanent, chronic pain, stiffness, nerve pain or damage, wound healing problems, bone healing problems including delayed or non-union.  Informed consent was signed and reviewed.  Surgery is scheduled for later this month.  He understands that there are no guarantees as the outcome of surgery could be made.  Now for anti-inflammatory relief for pain control until surgery I recommended a methylprednisolone taper followed by 800 mg ibuprofen as needed   Surgical plan:  Procedure: -Left foot first metatarsophalangeal joint arthrodesis with bone graft, second and third metatarsal Weil osteotomy  Location: -[facility]  Anesthesia plan: -IV sedation with regional block  Postoperative pain plan: - Tylenol 1000 mg every 6 hours, ibuprofen 600 mg every 6 hours, gabapentin 300 mg every 8 hours x5 days, oxycodone 5 mg 1-2 tabs every 6 hours only as needed  DVT prophylaxis: -ASA 325 mg  WB Restrictions / DME needs: -NWB in CAM boot postop this to be  dispensed at hospital   No follow-ups on file.

## 2022-02-09 ENCOUNTER — Telehealth: Payer: Self-pay | Admitting: Urology

## 2022-02-09 NOTE — Telephone Encounter (Signed)
DOS - 03/02/22  METATARSAL OSTEOTOMY 2,3 LEFT --- 28308 HALLUX MPJ FUSION LEFT --- 29021 BONE GRAFT --- 20900  HTA EFFECTIVE DATE - 07/09/21  RECEIVED FAX FROM [facility] STATING THAT CPT CODES 11552 AND 20900 NO PRIOR AUTH IS REQUIRED. CPT CODE 08022 HAS BEEN APPROVED, AUTH # [id number], GOOD FROM 03/02/22 - 03/02/22.

## 2022-02-19 ENCOUNTER — Encounter
Admission: RE | Admit: 2022-02-19 | Discharge: 2022-02-19 | Disposition: A | Discharge status: Home / Self Care | Payer: Medicaid Other | Source: Ambulatory Visit | Attending: Podiatry | Admitting: Podiatry

## 2022-02-19 VITALS — Ht 76.0 in | Wt 274.0 lb

## 2022-02-19 DIAGNOSIS — I10 Essential (primary) hypertension: Secondary | ICD-10-CM

## 2022-02-19 NOTE — Patient Instructions (Signed)
Your procedure is scheduled on: 03/02/22 Report to DAY SURGERY DEPARTMENT LOCATED ON 2ND FLOOR MEDICAL MALL ENTRANCE. To find out your arrival time please call [phone] between 1PM - 3PM on 03/01/22.  Remember: Instructions that are not followed completely may result in serious medical risk, up to and including death, or upon the discretion of your surgeon and anesthesiologist your surgery may need to be rescheduled.     _X__ 1. Do not eat food after midnight the night before your procedure.                 No gum chewing or hard candies. You may drink clear liquids up to 2 hours                 before you are scheduled to arrive for your surgery- DO not drink clear                 liquids within 2 hours of the start of your surgery.                 Clear Liquids include:  water, apple juice without pulp, clear carbohydrate                 drink such as Clearfast or Gatorade, Black Coffee or Tea (Do not add                 anything to coffee or tea). Diabetics water only  __X__2.  On the morning of surgery brush your teeth with toothpaste and water, you                 may rinse your mouth with mouthwash if you wish.  Do not swallow any              toothpaste of mouthwash.     _X__ 3.  No Alcohol for 24 hours before or after surgery.   _X__ 4.  Do Not Smoke or use e-cigarettes For 24 Hours Prior to Your Surgery.                 Do not use any chewable tobacco products for at least 6 hours prior to                 surgery.  ____  5.  Bring all medications with you on the day of surgery if instructed.   __X__  6.  Notify your doctor if there is any change in your medical condition      (cold, fever, infections).     Do not wear jewelry, make-up, hairpins, clips or nail polish. Do not wear lotions, powders, or perfumes.  Do not shave 48 hours prior to surgery. Men may shave face and neck. Do not bring valuables to the hospital.    [facility] is not responsible for any belongings or  valuables.  Contacts, dentures/partials or body piercings may not be worn into surgery. Bring a case for your contacts, glasses or hearing aids, a denture cup will be supplied. Leave your suitcase in the car. After surgery it may be brought to your room. For patients admitted to the hospital, discharge time is determined by your treatment team.   Patients discharged the day of surgery will not be allowed to drive home.     __X__ Take these medicines the morning of surgery with A SIP OF WATER:    1. amLODipine (NORVASC) 10 MG tablet  2. omeprazole (PRILOSEC) 20 MG capsule  3. oxyCODONE-acetaminophen (PERCOCET) 10-325 MG tablet  4.  5.  6.  ____ Fleet Enema (as directed)   __X__ Use CHG Soap/SAGE wipes as directed  __X__ Use inhalers on the day of surgery  USE YOUR NEBULIZER   ____ Stop metformin/Janumet/Farxiga 2 days prior to surgery    ____ Take 1/2 of usual insulin dose the night before surgery. No insulin the morning          of surgery.   ____ Stop Blood Thinners Coumadin/Plavix/Xarelto/Pleta/Pradaxa/Eliquis/Effient/Aspirin  on   Or contact your Surgeon, Cardiologist or Medical Doctor regarding  ability to stop your blood thinners  __X__ Stop Anti-inflammatories 7 days before surgery such as Advil, Ibuprofen, Motrin,  BC or Goodies Powder, Naprosyn, Naproxen, Aleve, Aspirin    __X__ Stop all herbal supplements, fish oil or vitamin E until after surgery.    ____ Bring C-Pap to the hospital.

## 2022-03-02 ENCOUNTER — Other Ambulatory Visit: Payer: Self-pay

## 2022-03-02 ENCOUNTER — Ambulatory Visit: Payer: Medicaid Other

## 2022-03-02 ENCOUNTER — Ambulatory Visit: Payer: Medicaid Other | Admitting: Registered Nurse

## 2022-03-02 ENCOUNTER — Ambulatory Visit
Admission: RE | Admit: 2022-03-02 | Discharge: 2022-03-02 | Disposition: A | Discharge status: Home / Self Care | Payer: Medicaid Other | Attending: Podiatry | Admitting: Podiatry

## 2022-03-02 ENCOUNTER — Encounter
Admission: RE | Disposition: A | Discharge status: Home / Self Care | Payer: Self-pay | Source: Home / Self Care | Attending: Podiatry

## 2022-03-02 ENCOUNTER — Encounter: Payer: Self-pay | Admitting: Podiatry

## 2022-03-02 DIAGNOSIS — M216X2 Other acquired deformities of left foot: Secondary | ICD-10-CM | POA: Diagnosis not present

## 2022-03-02 DIAGNOSIS — M2012 Hallux valgus (acquired), left foot: Secondary | ICD-10-CM | POA: Diagnosis present

## 2022-03-02 DIAGNOSIS — M19072 Primary osteoarthritis, left ankle and foot: Secondary | ICD-10-CM

## 2022-03-02 DIAGNOSIS — K219 Gastro-esophageal reflux disease without esophagitis: Secondary | ICD-10-CM | POA: Insufficient documentation

## 2022-03-02 DIAGNOSIS — E785 Hyperlipidemia, unspecified: Secondary | ICD-10-CM | POA: Insufficient documentation

## 2022-03-02 DIAGNOSIS — J4540 Moderate persistent asthma, uncomplicated: Secondary | ICD-10-CM | POA: Insufficient documentation

## 2022-03-02 DIAGNOSIS — F418 Other specified anxiety disorders: Secondary | ICD-10-CM | POA: Insufficient documentation

## 2022-03-02 DIAGNOSIS — I10 Essential (primary) hypertension: Secondary | ICD-10-CM | POA: Diagnosis not present

## 2022-03-02 HISTORY — PX: HALLUX FUSION: SHX6621

## 2022-03-02 HISTORY — PX: METATARSAL OSTEOTOMY: SHX1641

## 2022-03-02 SURGERY — FUSION, JOINT, GREAT TOE
Anesthesia: General | Laterality: Left

## 2022-03-02 MED ORDER — IBUPROFEN 600 MG PO TABS: 600.0000 mg | ORAL_TABLET | Freq: Three times a day (TID) | ORAL | 0 refills | Status: AC | PRN

## 2022-03-02 MED ORDER — CHLORHEXIDINE GLUCONATE 0.12 % MT SOLN: 15.0000 mL | Freq: Once | OROMUCOSAL | Status: AC

## 2022-03-02 MED ORDER — CEFAZOLIN SODIUM-DEXTROSE 2-4 GM/100ML-% IV SOLN
INTRAVENOUS | Status: AC
  Filled 2022-03-02: qty 100

## 2022-03-02 MED ORDER — ONDANSETRON HCL 4 MG/2ML IJ SOLN
INTRAMUSCULAR | Status: DC | PRN
  Administered 2022-03-02: 4 mg via INTRAVENOUS

## 2022-03-02 MED ORDER — FENTANYL CITRATE PF 50 MCG/ML IJ SOSY: 50.0000 ug | PREFILLED_SYRINGE | INTRAMUSCULAR | Status: DC | PRN

## 2022-03-02 MED ORDER — FENTANYL CITRATE (PF) 100 MCG/2ML IJ SOLN: 25.0000 ug | INTRAMUSCULAR | Status: DC | PRN

## 2022-03-02 MED ORDER — MIDAZOLAM HCL 2 MG/2ML IJ SOLN
1.0000 mg | INTRAMUSCULAR | Status: AC | PRN
  Administered 2022-03-02: 1 mg via INTRAVENOUS

## 2022-03-02 MED ORDER — DEXAMETHASONE SODIUM PHOSPHATE 10 MG/ML IJ SOLN
INTRAMUSCULAR | Status: DC | PRN
  Administered 2022-03-02: 10 mg via INTRAVENOUS

## 2022-03-02 MED ORDER — CEFAZOLIN SODIUM-DEXTROSE 2-4 GM/100ML-% IV SOLN
2.0000 g | INTRAVENOUS | Status: AC
  Administered 2022-03-02: 2 g via INTRAVENOUS

## 2022-03-02 MED ORDER — OXYCODONE HCL 5 MG PO TABS
ORAL_TABLET | ORAL | Status: AC
  Filled 2022-03-02: qty 1

## 2022-03-02 MED ORDER — MIDAZOLAM HCL 2 MG/2ML IJ SOLN
INTRAMUSCULAR | Status: AC
  Administered 2022-03-02: 1 mg via INTRAVENOUS
  Filled 2022-03-02: qty 2

## 2022-03-02 MED ORDER — FENTANYL CITRATE (PF) 100 MCG/2ML IJ SOLN
INTRAMUSCULAR | Status: DC | PRN
Start: 2022-03-02 — End: 2022-03-02
  Administered 2022-03-02: 50 ug via INTRAVENOUS

## 2022-03-02 MED ORDER — 0.9 % SODIUM CHLORIDE (POUR BTL) OPTIME
TOPICAL | Status: DC | PRN
  Administered 2022-03-02: 500 mL

## 2022-03-02 MED ORDER — FENTANYL CITRATE (PF) 100 MCG/2ML IJ SOLN
INTRAMUSCULAR | Status: AC
  Filled 2022-03-02: qty 2

## 2022-03-02 MED ORDER — ASPIRIN 325 MG PO TBEC: 325.0000 mg | DELAYED_RELEASE_TABLET | Freq: Two times a day (BID) | ORAL | 0 refills | Status: AC

## 2022-03-02 MED ORDER — ORAL CARE MOUTH RINSE: 15.0000 mL | Freq: Once | OROMUCOSAL | Status: AC

## 2022-03-02 MED ORDER — PROMETHAZINE HCL 25 MG/ML IJ SOLN: 6.2500 mg | INTRAMUSCULAR | Status: DC | PRN

## 2022-03-02 MED ORDER — PROPOFOL 10 MG/ML IV BOLUS
INTRAVENOUS | Status: DC | PRN
  Administered 2022-03-02: 200 mg via INTRAVENOUS

## 2022-03-02 MED ORDER — OXYCODONE HCL 5 MG PO TABS: 5.0000 mg | ORAL_TABLET | ORAL | 0 refills | Status: AC | PRN

## 2022-03-02 MED ORDER — PHENYLEPHRINE HCL-NACL 20-0.9 MG/250ML-% IV SOLN
INTRAVENOUS | Status: DC | PRN
  Administered 2022-03-02: 20 ug/min via INTRAVENOUS

## 2022-03-02 MED ORDER — ROPIVACAINE HCL 5 MG/ML IJ SOLN
INTRAMUSCULAR | Status: AC
  Filled 2022-03-02: qty 30

## 2022-03-02 MED ORDER — PHENYLEPHRINE 80 MCG/ML (10ML) SYRINGE FOR IV PUSH (FOR BLOOD PRESSURE SUPPORT)
PREFILLED_SYRINGE | INTRAVENOUS | Status: AC
  Filled 2022-03-02: qty 10

## 2022-03-02 MED ORDER — MIDAZOLAM HCL 2 MG/2ML IJ SOLN
INTRAMUSCULAR | Status: AC
  Filled 2022-03-02: qty 2

## 2022-03-02 MED ORDER — LIDOCAINE HCL (PF) 1 % IJ SOLN
INTRAMUSCULAR | Status: DC | PRN
  Administered 2022-03-02: 5 mL via SUBCUTANEOUS

## 2022-03-02 MED ORDER — GABAPENTIN 300 MG PO CAPS: 300.0000 mg | ORAL_CAPSULE | Freq: Three times a day (TID) | ORAL | 0 refills | Status: AC

## 2022-03-02 MED ORDER — OXYCODONE HCL 5 MG PO TABS
5.0000 mg | ORAL_TABLET | Freq: Once | ORAL | Status: AC
  Administered 2022-03-02: 5 mg via ORAL

## 2022-03-02 MED ORDER — LIDOCAINE HCL (PF) 1 % IJ SOLN
INTRAMUSCULAR | Status: AC
  Filled 2022-03-02: qty 5

## 2022-03-02 MED ORDER — PROPOFOL 10 MG/ML IV BOLUS
INTRAVENOUS | Status: AC
  Filled 2022-03-02: qty 40

## 2022-03-02 MED ORDER — PHENYLEPHRINE HCL (PRESSORS) 10 MG/ML IV SOLN
INTRAVENOUS | Status: DC | PRN
  Administered 2022-03-02 (×2): 160 ug via INTRAVENOUS

## 2022-03-02 MED ORDER — CHLORHEXIDINE GLUCONATE 0.12 % MT SOLN
OROMUCOSAL | Status: AC
  Administered 2022-03-02: 15 mL via OROMUCOSAL
  Filled 2022-03-02: qty 15

## 2022-03-02 MED ORDER — LACTATED RINGERS IV SOLN: INTRAVENOUS | Status: DC

## 2022-03-02 MED ORDER — FENTANYL CITRATE PF 50 MCG/ML IJ SOSY
PREFILLED_SYRINGE | INTRAMUSCULAR | Status: AC
  Administered 2022-03-02: 50 ug via INTRAVENOUS
  Filled 2022-03-02: qty 2

## 2022-03-02 MED ORDER — PHENYLEPHRINE HCL-NACL 20-0.9 MG/250ML-% IV SOLN
INTRAVENOUS | Status: AC
  Filled 2022-03-02: qty 250

## 2022-03-02 MED ORDER — DEXMEDETOMIDINE HCL IN NACL 200 MCG/50ML IV SOLN
INTRAVENOUS | Status: DC | PRN
  Administered 2022-03-02: 4 ug via INTRAVENOUS

## 2022-03-02 MED ORDER — ROPIVACAINE HCL 5 MG/ML IJ SOLN
INTRAMUSCULAR | Status: DC | PRN
  Administered 2022-03-02: 30 mL via PERINEURAL

## 2022-03-02 MED ORDER — LIDOCAINE HCL (CARDIAC) PF 100 MG/5ML IV SOSY
PREFILLED_SYRINGE | INTRAVENOUS | Status: DC | PRN
  Administered 2022-03-02: 50 mg via INTRAVENOUS

## 2022-03-02 SURGICAL SUPPLY — 72 items
APL PRP STRL LF DISP 70% ISPRP (MISCELLANEOUS) ×1
BIT DRILL 2.0MM (BIT) IMPLANT
BIT DRILL 2.1 (BIT) IMPLANT
BIT DRILL 2.5X3.5XSCR (BIT) IMPLANT
BIT DRILL 2X1.7XAO CPLNG CANN (BIT) IMPLANT
BIT DRILL CANN 2.0 (BIT) ×2
BIT DRL 2.5X3.5XSCR (BIT) ×1
BIT DRL 2X1.7XAO CPLNG CANN (BIT) ×2
BNDG CMPR 75X41 PLY HI ABS (GAUZE/BANDAGES/DRESSINGS) ×1
BNDG CMPR STD VLCR NS LF 5.8X4 (GAUZE/BANDAGES/DRESSINGS) ×1
BNDG CMPR STD VLCR NS LF 5.8X6 (GAUZE/BANDAGES/DRESSINGS) ×1
BNDG ELASTIC 4X5.8 VLCR NS LF (GAUZE/BANDAGES/DRESSINGS) ×1 IMPLANT
BNDG ELASTIC 4X5.8 VLCR STR LF (GAUZE/BANDAGES/DRESSINGS) IMPLANT
BNDG ELASTIC 6X5.8 VLCR NS LF (GAUZE/BANDAGES/DRESSINGS) ×1 IMPLANT
BNDG ESMARK 4X12 TAN STRL LF (GAUZE/BANDAGES/DRESSINGS) ×1 IMPLANT
BNDG GAUZE DERMACEA FLUFF (GAUZE/BANDAGES/DRESSINGS) ×1
BNDG GAUZE DERMACEA FLUFF 4 (GAUZE/BANDAGES/DRESSINGS) ×1 IMPLANT
BNDG GZE DERMACEA 4 6PLY (GAUZE/BANDAGES/DRESSINGS) ×1
BNDG STRETCH 4X75 STRL LF (GAUZE/BANDAGES/DRESSINGS) IMPLANT
CHLORAPREP W/TINT 26 (MISCELLANEOUS) IMPLANT
COUNTERSINK CANN 3X3 NSTRL (BIT) ×1
COVER LIGHT HANDLE STERIS (MISCELLANEOUS) ×2 IMPLANT
DRAPE FLUOR MINI C-ARM 54X84 (DRAPES) ×1 IMPLANT
DRILL BIT 2.0MM (BIT) ×1
DRILL BIT 2.1 (BIT) ×1
DRILL BIT 2.5MM (BIT) ×1
DRSG XEROFORM 1X8 (GAUZE/BANDAGES/DRESSINGS) IMPLANT
ELECT REM PT RETURN 9FT ADLT (ELECTROSURGICAL) ×1
ELECTRODE REM PT RTRN 9FT ADLT (ELECTROSURGICAL) ×1 IMPLANT
GAUZE SPONGE 4X4 12PLY STRL (GAUZE/BANDAGES/DRESSINGS) ×1 IMPLANT
GAUZE XEROFORM 1X8 LF (GAUZE/BANDAGES/DRESSINGS) ×1 IMPLANT
GLOVE BIO SURGEON STRL SZ7 (GLOVE) ×1 IMPLANT
GLOVE BIOGEL PI IND STRL 7.0 (GLOVE) ×1 IMPLANT
GLOVE BIOGEL PI INDICATOR 7.0 (GLOVE) ×1
GOWN STRL REUS W/ TWL LRG LVL3 (GOWN DISPOSABLE) ×2 IMPLANT
GOWN STRL REUS W/TWL LRG LVL3 (GOWN DISPOSABLE) ×2
GUIDEWIRE ASNIS 2.0 100 NS (WIRE) IMPLANT
K-WIRE 1.2 (WIRE) ×1
K-WIRE 1.6X150 (WIRE) ×1
K-WIRE FX150X1.6XSMTH TROC (WIRE) ×1
K-WIRE OLIVE 1.2X65 (WIRE) ×2
KIT TURNOVER KIT A (KITS) ×1 IMPLANT
KWIRE 1.2 (WIRE) IMPLANT
KWIRE FX150X1.6XSMTH TROC (WIRE) IMPLANT
KWIRE OLIVE 1.2X65 (WIRE) IMPLANT
MANIFOLD NEPTUNE II (INSTRUMENTS) ×1 IMPLANT
NS IRRIG 500ML POUR BTL (IV SOLUTION) ×1 IMPLANT
PACK EXTREMITY ARMC (MISCELLANEOUS) ×1 IMPLANT
PADDING CAST BLEND 4X4 NS (MISCELLANEOUS) ×4 IMPLANT
PENCIL SMOKE EVACUATOR (MISCELLANEOUS) ×1 IMPLANT
PLATE MTP HOLEX8 LOCK 1ST LEFT (Plate) IMPLANT
PROBE BIOSP ALOKA ALPHA6 PROST (MISCELLANEOUS) IMPLANT
SCREW BONE LOCK 3X18MM TI T8 (Screw) IMPLANT
SCREW CANN 3.0X40 (Screw) IMPLANT
SCREW CANNULATED 2.0X14 (Screw) IMPLANT
SCREW COUNTRSNK CANN 3X3 NSTRL (BIT) IMPLANT
SCREW LOCK 3.0X12MM (Screw) IMPLANT
SCREW LOCK 3.0X14MM (Screw) IMPLANT
SCREW LOCK 3X16MM (Screw) IMPLANT
SCREW LOCK T8 16X3.5X STRDR (Screw) IMPLANT
SCREW LOCKING 3.5X16MM (Screw) ×2 IMPLANT
SCREW NON LOCKING 3.5X22MM (Screw) IMPLANT
STOCKINETTE IMPERVIOUS 9X36 MD (GAUZE/BANDAGES/DRESSINGS) ×1 IMPLANT
SUT ETHILON 4 0 PS 2 18 (SUTURE) IMPLANT
SUT MNCRL AB 3-0 PS2 27 (SUTURE) IMPLANT
SUT MNCRL AB 4-0 PS2 18 (SUTURE) IMPLANT
SUT VIC AB 2-0 SH 27 (SUTURE)
SUT VIC AB 2-0 SH 27XBRD (SUTURE) IMPLANT
TRAP FLUID SMOKE EVACUATOR (MISCELLANEOUS) ×1 IMPLANT
WATER STERILE IRR 500ML POUR (IV SOLUTION) ×1 IMPLANT
WIRE Z .062 C-WIRE SPADE TIP (WIRE) IMPLANT
drill bit for 2.o cannulated screw IMPLANT

## 2022-03-02 NOTE — H&P (Signed)
History and Physical Interval Note:  03/02/2022 7:27 AM  Caleb Hunter  has presented today for surgery, with the diagnosis of left foot bunion, arthritis .  The various methods of treatment have been discussed with the patient and family. After consideration of risks, benefits and other options for treatment, the patient has consented to   Procedure(s): HALLUX FUSION, METETARSOPHALANGEAL JOINT (Left) METATARSAL OSTEOTOMY SECOND  AND THIRD TOE OF LEFT FOOT (Left) BONE GRAFT (Left) as a surgical intervention.  The patient's history has been reviewed, patient examined, no change in status, stable for surgery.  I have reviewed the patient's chart and labs.  Questions were answered to the patient's satisfaction.     Edwin Cap

## 2022-03-02 NOTE — Transfer of Care (Signed)
Immediate Anesthesia Transfer of Care Note  Patient: Caleb Hunter  Procedure(s) Performed: HALLUX FUSION, METETARSOPHALANGEAL JOINT (Left) METATARSAL OSTEOTOMY SECOND  AND THIRD TOE OF LEFT FOOT (Left)  Patient Location: PACU  Anesthesia Type:General  Level of Consciousness: awake and drowsy  Airway & Oxygen Therapy: Patient Spontanous Breathing  Post-op Assessment: Report given to RN and Post -op Vital signs reviewed and stable  Post vital signs: Reviewed and stable  Last Vitals:  Vitals Value Taken Time  BP 128/69 03/02/22 1155  Temp    Pulse 75 03/02/22 1159  Resp 14 03/02/22 1159  SpO2 96 % 03/02/22 1159  Vitals shown include unvalidated device data.  Last Pain:  Vitals:   03/02/22 0910  TempSrc:   PainSc: Asleep         Complications: No notable events documented.

## 2022-03-02 NOTE — Anesthesia Procedure Notes (Signed)
Procedure Name: LMA Insertion Date/Time: 03/02/2022 9:29 AM  Performed by: Reece Agar, CRNAPre-anesthesia Checklist: Patient identified, Emergency Drugs available, Suction available and Patient being monitored Patient Re-evaluated:Patient Re-evaluated prior to induction Oxygen Delivery Method: Circle system utilized Preoxygenation: Pre-oxygenation with 100% oxygen Induction Type: IV induction Ventilation: Mask ventilation without difficulty LMA: LMA inserted LMA Size: 5.0 Number of attempts: 1 Placement Confirmation: positive ETCO2 and breath sounds checked- equal and bilateral Tube secured with: Tape Dental Injury: Teeth and Oropharynx as per pre-operative assessment

## 2022-03-02 NOTE — Anesthesia Preprocedure Evaluation (Signed)
Anesthesia Evaluation  Patient identified by MRN, date of birth, ID band Patient awake    Reviewed: Allergy & Precautions, H&P , NPO status , Patient's Chart, lab work & pertinent test results, reviewed documented beta blocker date and time   History of Anesthesia Complications Negative for: history of anesthetic complications  Airway Mallampati: III  TM Distance: >3 FB Neck ROM: full    Dental  (+) Dental Advidsory Given, Caps, Teeth Intact, Missing   Pulmonary neg shortness of breath, asthma , neg sleep apnea, neg COPD, neg recent URI,    Pulmonary exam normal breath sounds clear to auscultation       Cardiovascular Exercise Tolerance: Good hypertension, (-) angina(-) Past MI and (-) Cardiac Stents Normal cardiovascular exam(-) dysrhythmias (-) Valvular Problems/Murmurs Rhythm:regular Rate:Normal     Neuro/Psych PSYCHIATRIC DISORDERS Anxiety Depression negative neurological ROS     GI/Hepatic Neg liver ROS, GERD  ,  Endo/Other  negative endocrine ROS  Renal/GU negative Renal ROS  negative genitourinary   Musculoskeletal   Abdominal   Peds  Hematology negative hematology ROS (+)   Anesthesia Other Findings Past Medical History: No date: Anxiety No date: Arthritis     Comment:  back No date: Chronic pain     Comment:  Pain management No date: Depression No date: Dyspnea     Comment:  per pt occasional sob w/ stairs and long distance due to              asthma No date: GERD (gastroesophageal reflux disease) No date: Gout No date: History of stomach ulcers     Comment:  per pt approx. 2018 No date: Hyperlipidemia No date: Hypertension No date: Moderate asthma     Comment:  followed by pcp--   (07-25-2020 per pt does use dulera               inhaler as was prescribed by pcp because he does not have              the prescribtion ,  stated used nebulizer last night for               wheezing and agaion  today) No date: Pre-diabetes   Reproductive/Obstetrics negative OB ROS                             Anesthesia Physical Anesthesia Plan  ASA: 2  Anesthesia Plan: General   Post-op Pain Management: Regional block*   Induction:   PONV Risk Score and Plan: 2 and Ondansetron, Dexamethasone, Midazolam and Treatment may vary due to age or medical condition  Airway Management Planned: LMA and Oral ETT  Additional Equipment:   Intra-op Plan:   Post-operative Plan: Extubation in OR  Informed Consent: I have reviewed the patients History and Physical, chart, labs and discussed the procedure including the risks, benefits and alternatives for the proposed anesthesia with the patient or authorized representative who has indicated his/her understanding and acceptance.     Dental Advisory Given  Plan Discussed with: Anesthesiologist, CRNA and Surgeon  Anesthesia Plan Comments:         Anesthesia Quick Evaluation

## 2022-03-02 NOTE — Anesthesia Postprocedure Evaluation (Signed)
Anesthesia Post Note  Patient: Nathanie Ottley  Procedure(s) Performed: HALLUX FUSION, METETARSOPHALANGEAL JOINT (Left) METATARSAL OSTEOTOMY SECOND  AND THIRD TOE OF LEFT FOOT (Left)  Patient location during evaluation: PACU Anesthesia Type: General Level of consciousness: awake and alert Pain management: pain level controlled Vital Signs Assessment: post-procedure vital signs reviewed and stable Respiratory status: spontaneous breathing, nonlabored ventilation, respiratory function stable and patient connected to nasal cannula oxygen Cardiovascular status: blood pressure returned to baseline and stable Postop Assessment: no apparent nausea or vomiting Anesthetic complications: no   No notable events documented.   Last Vitals:  Vitals:   03/02/22 1230 03/02/22 1244  BP: 137/89 (!) 143/93  Pulse: 78 81  Resp: 15 16  Temp:  (!) 36.2 C  SpO2: 97% 97%    Last Pain:  Vitals:   03/02/22 1244  TempSrc: Temporal  PainSc: 0-No pain                 Lenard Simmer

## 2022-03-02 NOTE — Op Note (Signed)
Patient Name: Caleb Hunter DOB: 01/11/1966  MRN: [id number]   Date of Service: 03/02/2022  Surgeon: Dr. 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 Item Serial No. Manufacturer Lot No. LRB No. Used Action  PLATE MTP HOLEX8 LOCK 1ST LEFT - [id number] Plate PLATE MTP HOLEX8 LOCK 1ST LEFT  [employer]  Left 1 Implanted  SCREW LOCK 3X16MM - [id number] Screw SCREW LOCK 3X16MM  [employer]  Left 1 Implanted  SCREW LOCK 3.0X14MM - [id number] Screw SCREW LOCK 3.0X14MM  [employer]  Left 1 Implanted  SCREW LOCK 3.0X12MM - [id number] Screw SCREW LOCK 3.0X12MM  [employer]  Left 1 Implanted  SCREW NON LOCKING 3.5X22MM - [id number] Screw SCREW NON LOCKING 3.5X22MM  [employer]  Left 1 Implanted  SCREW LOCKING 3.5X16MM - [id number] Screw SCREW LOCKING 3.5X16MM  [employer]  Left 2 Implanted  SCREW BONE LOCK 3X18MM TI T8 - [id number] Screw SCREW BONE LOCK 3X18MM TI T8  [employer]  Left 1 Implanted  3.29mm X 40 mm Cannulated Screw Screw   [employer]  Left 1 Implanted  2.20mm x 14 mm cannulated screw    [employer]  Left 2 Implanted   Medications: None Complications: No complication noted  Indications for Procedure:  This is a 56 y.o. male with a history of recurrent hallux valgus deformity, osteoarthritis of the first metatarsophalangeal joint and metatarsalgia of the second and  third MTPJ's.  He elected for surgical intervention following a period of nonsurgical treatment   Procedure in Detail: Patient was identified in pre-operative holding area. Formal consent was signed and the left kidney lower extremity was marked. Patient was brought back to the operating room. Anesthesia was induced. The extremity was prepped and draped in the usual sterile fashion. Timeout was taken to confirm patient name, laterality, and procedure prior to incision.   Attention was then directed to the left foot where a longitudinal incision was made just medial to the extensor hallucis longus tendon.  This carried through subcutaneous tissues all bleeders were cauterized as necessary.  Neurovascular bundles and extensor tendon were safely retracted and dissection was carried down to the capsule of the first metatarsal phalangeal joint which was incised and a McGlamry elevator was used to expose the metatarsal head and base of the proximal phalanx.  Soft tissue attachments were freed from the base of the phalanx.  Osteoarthritis of both surfaces was noted.  A guidewire for the reamer was placed over the metatarsal head and the reamer was used to resect the articular cartilage and some of the subchondral bone plate.  The same was done with the cone reamer using a guidewire on the base of the proximal phalanx.  The prominent medial eminence of the first metatarsal head was then resected using a sagittal saw.  All bony and soft tissue debris was then thoroughly irrigated from the wound.  A 2.5 mm drill bit was used to fenestrate the head of the first metatarsal and base of the proximal phalanx.   The hallux was then  placed into a corrected position using fluoroscopic guidance.  Once satisfactory position had been achieved in the transverse plane it was slightly dorsiflexed using a plate to simulate weightbearing and a folded 4 x 4 sponge.  A guidewire for the 3.0 millimeter screw was then placed.  The screw  was placed with good compression and stability across the fusion site.  I then directed my attention dorsally and selected a plate and put it in temporary position using plate tacks.  Locking screws were placed in the distal holes with good stability of the construct.  Once satisfactory position was achieved locking and nonlocking screws were placed in the proximal screw holes, the non locking screw was used in the compression slot to achieve further compression.    I then directed my attention to the second interspace a linear incision was made over the distal metatarsals.  Dissection was carried through subcutaneous tissue, cauterizing bleeding vessels as necessary.  The neurovascular vessels were retracted along with the extensor digitorum longus tendon at the second MTPJ.  The dorsal capsule was incised and reflected from the dorsal metatarsal head.  The collateral ligaments were left intact.  The dorsal metatarsal head was exposed and the toe was flexed.  A sagittal saw was used to make a transverse Weil osteotomy parallel with the weightbearing surface of the foot.  The capital fragment was translated proximally to the appropriate position and temporarily fixated with a K wire and a guidewire for a 2.0 micro Asnis screw.  Fluoroscopy confirmed correction.  The screw was drilled and advanced with good compression and stability of the osteotomy noted.  Identical but separate procedure was then performed on the third metatarsal head.  Final films were taken and the incision was thoroughly irrigated, closure was initiated in layers using 3-0 and 4-0 Monocryl and 4-0 nylon.   The foot was then dressed with Xeroform dry sterile dressings and an Ace wrap under light compression. Patient tolerated the procedure well.   Disposition: Following a period of post-operative monitoring, patient will be transferred to home.  He will be nonweightbearing in a cam walker boot.

## 2022-03-02 NOTE — Discharge Instructions (Signed)
Post-Surgery Instructions  1. If you are recuperating from surgery anywhere other than home, please be sure to leave [place] a number where you can be reached. 2. Go directly home and rest. 3. The keep operated foot (or feet) elevated six inches above the hip when sitting or lying down. 4. Support the elevated foot and leg with pillows under the calf. DO NOT PLACE PILLOWS UNDER THE KNEE. 5. DO NOT REMOVE or get your bandages wet. This will increase your chances of getting an infection. 6. Wear your surgical shoe at all times when you are up. 7. A limited amount of pain and swelling may occur. The skin may take on a bruised appearance. This is no cause for alarm. 8. For slight pain and swelling, apply an ice pack directly over the bandage for 15 minutes every hour. Continue icing until seen in the office. DO NOT apply any form of heat to the area. 9. Have prescription(s) filled immediately and take as directed. 10. Drink lots of liquids, water, and juice. 11. CALL THE OFFICE IMMEDIATELY IF: a. Bleeding continues b. Pain increases and/or does not respond to medication c. Bandage or cast appears too tight d. Any liquids (water, coffee, etc.) have spilled on your bandages. e. Tripping, falling, or stubbing the surgical foot f. If your temperature rises above 101 g. If you have ANY questions at all 12. Please use the crutches, knee scooter, or walker you have prescribed, rented, or purchased. If you are non-weight bearing DO NOT put weight on the operated foot for _________ days. If you are weight-bearing, follow your physician's instructions. You are expected to be: ? weight-bearing ? non-weight bearing 13. Special Instructions: _____________________________________________________________ _________________________________________________________________________________ _________________________________________________________________________________  14. Your next appointment is:  03/09/2022  9:15 AM Regal, Kirstie Peri, DPM    If you need to reach the nurse for any reason, please call: /[place]: [phone] [place]: [phone] [place]: [phone]

## 2022-03-02 NOTE — Anesthesia Procedure Notes (Signed)
Anesthesia Regional Block: Popliteal block   Pre-Anesthetic Checklist: , timeout performed,  Correct Patient, Correct Site, Correct Laterality,  Correct Procedure, Correct Position, site marked,  Risks and benefits discussed,  Surgical consent,  Pre-op evaluation,  At surgeon's request and post-op pain management  Laterality: Lower  Prep: chloraprep       Needles:  Injection technique: Single-shot  Needle Type: Echogenic Needle     Needle Length: 9cm  Needle Gauge: 21     Additional Needles:   Procedures:,,,, ultrasound used (permanent image in chart),,    Narrative:  Start time: 03/02/2022 9:11 AM End time: 03/02/2022 9:13 AM Injection made incrementally with aspirations every 5 mL.  Performed by: Personally  Anesthesiologist: Lenard Simmer, MD  Additional Notes: Patient consented for risk and benefits of nerve block including but not limited to nerve damage, failed block, bleeding and infection.  Patient voiced understanding.  Functioning IV was confirmed and monitors were applied.  Timeout done prior to procedure and prior to any sedation being given to the patient.  Patient confirmed procedure site prior to any sedation given to the patient.  A 19mm 22ga Stimuplex needle was used. Sterile prep,hand hygiene and sterile gloves were used.  Minimal sedation used for procedure.  No paresthesia endorsed by patient during the procedure.  Negative aspiration and negative test dose prior to incremental administration of local anesthetic. The patient tolerated the procedure well with no immediate complications.

## 2022-03-05 ENCOUNTER — Encounter: Payer: Self-pay | Admitting: Podiatry

## 2022-03-06 DIAGNOSIS — M216X2 Other acquired deformities of left foot: Secondary | ICD-10-CM

## 2022-03-06 DIAGNOSIS — M19072 Primary osteoarthritis, left ankle and foot: Secondary | ICD-10-CM

## 2022-03-09 ENCOUNTER — Ambulatory Visit: Payer: Medicaid Other

## 2022-03-09 ENCOUNTER — Ambulatory Visit (INDEPENDENT_AMBULATORY_CARE_PROVIDER_SITE_OTHER): Payer: Medicaid Other

## 2022-03-09 ENCOUNTER — Ambulatory Visit (INDEPENDENT_AMBULATORY_CARE_PROVIDER_SITE_OTHER): Payer: Medicaid Other | Admitting: Podiatry

## 2022-03-09 ENCOUNTER — Encounter: Payer: Self-pay | Admitting: Podiatry

## 2022-03-09 ENCOUNTER — Encounter: Payer: Medicaid Other | Admitting: Podiatry

## 2022-03-09 DIAGNOSIS — Z9889 Other specified postprocedural states: Secondary | ICD-10-CM | POA: Diagnosis not present

## 2022-03-09 NOTE — Progress Notes (Signed)
Subjective:   Patient ID: Caleb Hunter, male   DOB: 56 y.o.   MRN: [id number]   HPI Patient states doing fine having some pain if I am on my foot too much   ROS      Objective:  Physical Exam  Neuro vascular status intact negative Denna Haggard' sign was noted wound edges coapted well first MPJ looks good first hallux is in good position     Assessment:  Doing well post first MPJ fusion shortening osteotomy second and third left     Plan:  H&P x-ray reviewed sterile dressing reapplied continue elevation compression immobilization reappoint 2 weeks suture removal with Dr. Lilian Kapur  X-rays indicate that the plate is in place good alignment is noted screws in place second and third metatarsal

## 2022-03-14 ENCOUNTER — Telehealth: Payer: Self-pay

## 2022-03-14 NOTE — Telephone Encounter (Signed)
oxyCODONE (OXY IR/ROXICODONE) 5 MG immediate release tablet  Prior authorization was started today on 03/14/22

## 2022-03-16 NOTE — Telephone Encounter (Signed)
Approvedon September 6 Approved. This drug has been approved. Approved quantity: 20 tablets per 7 day(s). You may fill up to a 34 day supply at a retail pharmacy. You may fill up to a 90 day supply for maintenance drugs,

## 2022-03-22 ENCOUNTER — Ambulatory Visit (INDEPENDENT_AMBULATORY_CARE_PROVIDER_SITE_OTHER): Payer: Medicaid Other | Admitting: Podiatry

## 2022-03-22 DIAGNOSIS — M21612 Bunion of left foot: Secondary | ICD-10-CM

## 2022-03-22 DIAGNOSIS — M19272 Secondary osteoarthritis, left ankle and foot: Secondary | ICD-10-CM

## 2022-03-22 DIAGNOSIS — M2012 Hallux valgus (acquired), left foot: Secondary | ICD-10-CM

## 2022-03-22 DIAGNOSIS — Z9889 Other specified postprocedural states: Secondary | ICD-10-CM

## 2022-03-26 MED ORDER — IBUPROFEN 800 MG PO TABS: 800.0000 mg | ORAL_TABLET | Freq: Three times a day (TID) | ORAL | 2 refills | Status: AC | PRN

## 2022-03-26 NOTE — Progress Notes (Signed)
  Subjective:  Patient ID: Caleb Hunter, male    DOB: 20-Jul-1965,  MRN: [id number]  Chief Complaint  Patient presents with   Routine Post Op     POV #2 DOS 03/02/2022 FUSION OF GREAT TOE JOINT LT FOOT, BONE CUT 2ND & 3RD  METATARSAL, BONE GRAFT FROM HEEL - suture removal today    56 y.o. male returns for post-op check.  Overall doing okay pain remains fairly well controlled  Review of Systems: Negative except as noted in the HPI. Denies N/V/F/Ch.   Objective:  There were no vitals filed for this visit. There is no height or weight on file to calculate BMI. Constitutional Well developed. Well nourished.  Vascular Foot warm and well perfused. Capillary refill normal to all digits.  Calf is soft and supple, no posterior calf or knee pain, negative Homans' sign  Neurologic Normal speech. Oriented to person, place, and time. Epicritic sensation to light touch grossly present bilaterally.  Dermatologic Skin healing well without signs of infection. Skin edges well coapted without signs of infection.  Orthopedic: Tenderness to palpation noted about the surgical site.   Multiple view plain film radiographs: Status post arthrodesis first metatarsal phalangeal joint, Weil osteotomy 2 and 3, no complication of hardware and alignment appears to be adequate Assessment:   1. Hallux valgus with bunions, left   2. Other secondary osteoarthritis of left foot   3. Post-operative state    Plan:  Patient was evaluated and treated and all questions answered.  S/p foot surgery left -Progressing as expected post-operatively. -XR: Noted above we will take new radiographs at next visit -WB Status: WBAT in cam walker boot -Sutures: Removed today May resume regular bathing at this point. -Medications: Refill ibuprofen 800 mg sent to pharmacy to use as needed -Foot redressed.  Return in about 3 weeks (around 04/12/2022) for post op (new x-rays).

## 2022-04-12 ENCOUNTER — Ambulatory Visit (INDEPENDENT_AMBULATORY_CARE_PROVIDER_SITE_OTHER): Payer: Medicaid Other | Admitting: Podiatry

## 2022-04-12 ENCOUNTER — Ambulatory Visit (INDEPENDENT_AMBULATORY_CARE_PROVIDER_SITE_OTHER): Payer: Medicaid Other

## 2022-04-12 DIAGNOSIS — M19272 Secondary osteoarthritis, left ankle and foot: Secondary | ICD-10-CM

## 2022-04-12 DIAGNOSIS — M2012 Hallux valgus (acquired), left foot: Secondary | ICD-10-CM | POA: Diagnosis not present

## 2022-04-12 DIAGNOSIS — M21612 Bunion of left foot: Secondary | ICD-10-CM

## 2022-04-15 MED ORDER — IBUPROFEN 800 MG PO TABS
800.0000 mg | ORAL_TABLET | Freq: Three times a day (TID) | ORAL | 0 refills | Status: DC | PRN
Start: 2022-04-15 — End: 2022-08-21

## 2022-04-15 NOTE — Progress Notes (Signed)
  Subjective:  Patient ID: Caleb Hunter, male    DOB: 22-Jan-1966,  MRN: [id number]  Chief Complaint  Patient presents with   Routine Post Op    BUNION REPAIR-  POV #3 DOS 03/02/2022 FUSION OF GREAT TOE JOINT LT FOOT, BONE CUT 2ND & 3RD METATARSAL, BONE GRAFT FROM HEEL    56 y.o. male returns for post-op check.   Still having some pain but feeling better overall  Review of Systems: Negative except as noted in the HPI. Denies N/V/F/Ch.   Objective:  There were no vitals filed for this visit. There is no height or weight on file to calculate BMI. Constitutional Well developed. Well nourished.  Vascular Foot warm and well perfused. Capillary refill normal to all digits.  Calf is soft and supple, no posterior calf or knee pain, negative Homans' sign  Neurologic Normal speech. Oriented to person, place, and time. Epicritic sensation to light touch grossly present bilaterally.  Dermatologic Skin healing well without signs of infection. Skin edges well coapted without signs of infection.  Orthopedic: Tenderness to palpation noted about the surgical site.   Multiple view plain film radiographs: Good early consolidation across the fusion sites and Weil osteotomies no complication of hardware noted Assessment:   1. Hallux valgus with bunions, left   2. Other secondary osteoarthritis of left foot    Plan:  Patient was evaluated and treated and all questions answered.  S/p foot surgery left -Progressing as expected post-operatively. -WB Status: WBAT in surgical shoe which was dispensed today he will do this for 2 more weeks and then can transition back to regular shoe gear advised to avoid any impact activity   Return in about 6 weeks (around 05/24/2022) for post op (new x-rays).

## 2022-05-03 ENCOUNTER — Encounter: Payer: Self-pay | Admitting: Podiatry

## 2022-05-22 ENCOUNTER — Encounter: Payer: Medicaid Other | Admitting: Podiatry

## 2022-05-24 ENCOUNTER — Ambulatory Visit (INDEPENDENT_AMBULATORY_CARE_PROVIDER_SITE_OTHER): Payer: Medicaid Other | Admitting: Podiatry

## 2022-05-24 ENCOUNTER — Ambulatory Visit (INDEPENDENT_AMBULATORY_CARE_PROVIDER_SITE_OTHER): Payer: Medicaid Other

## 2022-05-24 ENCOUNTER — Encounter: Payer: Self-pay | Admitting: Podiatry

## 2022-05-24 DIAGNOSIS — M21612 Bunion of left foot: Secondary | ICD-10-CM

## 2022-05-24 DIAGNOSIS — M2012 Hallux valgus (acquired), left foot: Secondary | ICD-10-CM

## 2022-05-24 DIAGNOSIS — M19272 Secondary osteoarthritis, left ankle and foot: Secondary | ICD-10-CM | POA: Diagnosis not present

## 2022-05-24 NOTE — Progress Notes (Signed)
  Subjective:  Patient ID: Caleb Hunter, male    DOB: 20-Jul-1965,  MRN: [id number]  Chief Complaint  Patient presents with   Routine Post Op    POV #4 DOS 03/02/2022 FUSION OF GREAT TOE JOINT LT FOOT, BONE CUT 2ND & 3RD METATARSAL, BONE GRAFT FROM HEEL    56 y.o. male returns for post-op check.   Says it feels cold in the toes sometimes and hurts.  They feel stiff  Review of Systems: Negative except as noted in the HPI. Denies N/V/F/Ch.   Objective:  There were no vitals filed for this visit. There is no height or weight on file to calculate BMI. Constitutional Well developed. Well nourished.  Vascular Foot warm and well perfused. Capillary refill normal to all digits.  Calf is soft and supple, no posterior calf or knee pain, negative Homans' sign  Neurologic Normal speech. Oriented to person, place, and time. Epicritic sensation to light touch grossly present bilaterally.  Dermatologic Incision is well-healed, minimal hypertrophy there is some contracture of the second MPJ  Orthopedic: Minimal tenderness to palpation he does have limited range of motion of the second and third MTPJ   Multiple view plain film radiographs: Maintained correction no complication of hardware noted complete consolidation across arthrodesis and fusion sites Assessment:   1. Hallux valgus with bunions, left   2. Other secondary osteoarthritis of left foot    Plan:  Patient was evaluated and treated and all questions answered.  S/p foot surgery left -Continues to progress.  He is having some postoperative stiffness in the MPJs of the lesser meds.  I recommended physical therapy for this and I do think this will take some time for the scar tissue to resolve completely I have no restrictions for him from shoe gear and activity standpoint.  Referral for PT sent and I will see him back in 3 months for follow-up.  Advised may take up to a full year for surgery for this to fully resolved.   Return in about 3  months (around 08/24/2022).

## 2022-05-24 NOTE — Patient Instructions (Signed)
Call to schedule physical therapy: [facility] at [facility] [address]  [phone]  

## 2022-06-06 ENCOUNTER — Ambulatory Visit: Payer: Medicaid Other | Admitting: Physical Therapy

## 2022-08-21 ENCOUNTER — Ambulatory Visit (INDEPENDENT_AMBULATORY_CARE_PROVIDER_SITE_OTHER): Payer: Medicaid Other

## 2022-08-21 ENCOUNTER — Ambulatory Visit (INDEPENDENT_AMBULATORY_CARE_PROVIDER_SITE_OTHER): Payer: Medicaid Other | Admitting: Podiatry

## 2022-08-21 VITALS — BP 132/83 | HR 73

## 2022-08-21 DIAGNOSIS — M2042 Other hammer toe(s) (acquired), left foot: Secondary | ICD-10-CM | POA: Diagnosis not present

## 2022-08-21 DIAGNOSIS — M21612 Bunion of left foot: Secondary | ICD-10-CM

## 2022-08-21 DIAGNOSIS — M2012 Hallux valgus (acquired), left foot: Secondary | ICD-10-CM | POA: Diagnosis not present

## 2022-08-21 DIAGNOSIS — M21962 Unspecified acquired deformity of left lower leg: Secondary | ICD-10-CM | POA: Diagnosis not present

## 2022-08-21 MED ORDER — IBUPROFEN 800 MG PO TABS: 800.0000 mg | ORAL_TABLET | Freq: Three times a day (TID) | ORAL | 0 refills | Status: AC | PRN

## 2022-08-21 NOTE — Progress Notes (Signed)
  Subjective:  Patient ID: Caleb Hunter, male    DOB: [birth year],  MRN: [id number]  Chief Complaint  Patient presents with   Foot Pain    Patient reports pain is still the same in the left foot since last visit.    57 y.o. male returns for post-op check.   Still having feelings of stiffness and occasional burning pain.  Says the symptoms feel cold  Review of Systems: Negative except as noted in the HPI. Denies N/V/F/Ch.   Objective:   Vitals:   08/21/22 0916  BP: 132/83  Pulse: 73   There is no height or weight on file to calculate BMI. Constitutional Well developed. Well nourished.  Vascular Foot warm and well perfused. Capillary refill normal to all digits.  Calf is soft and supple, no posterior calf or knee pain, negative Homans' sign  Neurologic Normal speech. Oriented to person, place, and time. Epicritic sensation to light touch grossly present bilaterally.  Dermatologic Incision is well-healed, not hypertrophic  Orthopedic: Range of motion of the lesser digits has improved.  He has only mild edema no warmth no pain to palpation   Multiple view plain film radiographs: Maintained correction no complication of hardware noted complete consolidation across arthrodesis and fusion sites Assessment:   1. Hallux valgus with bunions, left   2. Metatarsal deformity, left   3. Hammertoe of left foot    Plan:  Patient was evaluated and treated and all questions answered.  S/p foot surgery left -Overall has progressed and improved since last evaluation.  He does have residual pain but feels to be neuritic in nature, I discussed with him this is likely due to multiple surgeries and scar tissue development.  I would like him to progress with full activity, I do think this may improve further with time, discussed the option of corticosteroid injection which if there is no improvement by next visit we will proceed with this along the scar.  Advised use topical pain relief creams and  anti-inflammatory such as Voltaren.  Refill ibuprofen sent to pharmacy to use as needed   Return in about 6 months (around 02/19/2023) for surgery follow up (new XR left foot).

## 2022-08-27 ENCOUNTER — Ambulatory Visit: Payer: Medicaid Other | Admitting: Physical Therapy

## 2022-08-28 ENCOUNTER — Ambulatory Visit: Payer: Medicaid Other | Admitting: Podiatry
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
## 2022-08-29 ENCOUNTER — Ambulatory Visit: Payer: Medicaid Other | Attending: Student | Admitting: Physical Therapy

## 2022-08-29 ENCOUNTER — Other Ambulatory Visit: Payer: Self-pay

## 2022-08-29 ENCOUNTER — Encounter: Payer: Self-pay | Admitting: Physical Therapy

## 2022-08-29 DIAGNOSIS — M542 Cervicalgia: Secondary | ICD-10-CM | POA: Diagnosis present

## 2022-08-29 DIAGNOSIS — R252 Cramp and spasm: Secondary | ICD-10-CM | POA: Insufficient documentation
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
## 2022-09-03 ENCOUNTER — Encounter: Payer: Self-pay | Admitting: Physical Therapy

## 2022-09-03 ENCOUNTER — Ambulatory Visit: Payer: Medicaid Other | Admitting: Physical Therapy

## 2022-09-03 DIAGNOSIS — M542 Cervicalgia: Secondary | ICD-10-CM

## 2022-09-03 DIAGNOSIS — R252 Cramp and spasm: Secondary | ICD-10-CM
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
## 2022-09-06 ENCOUNTER — Encounter: Payer: Self-pay | Admitting: Physical Therapy

## 2022-09-06 ENCOUNTER — Ambulatory Visit: Payer: Medicaid Other | Admitting: Physical Therapy

## 2022-09-06 DIAGNOSIS — R252 Cramp and spasm: Secondary | ICD-10-CM

## 2022-09-06 DIAGNOSIS — M542 Cervicalgia: Secondary | ICD-10-CM | POA: Diagnosis not present

## 2022-09-10 ENCOUNTER — Ambulatory Visit: Payer: Medicaid Other | Admitting: Physical Therapy

## 2022-09-13 ENCOUNTER — Ambulatory Visit: Payer: Medicaid Other | Attending: Student | Admitting: Physical Therapy

## 2022-09-13 ENCOUNTER — Encounter: Payer: Self-pay | Admitting: Physical Therapy

## 2022-09-13 DIAGNOSIS — R252 Cramp and spasm: Secondary | ICD-10-CM

## 2022-09-13 DIAGNOSIS — M542 Cervicalgia: Secondary | ICD-10-CM | POA: Insufficient documentation
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
## 2022-09-17 ENCOUNTER — Encounter: Payer: Medicaid Other | Admitting: Physical Therapy

## 2022-09-25 ENCOUNTER — Encounter: Payer: Medicaid Other | Admitting: Physical Therapy

## 2022-09-26 ENCOUNTER — Encounter: Payer: Medicaid Other | Admitting: Physical Therapy

## 2022-10-04 ENCOUNTER — Other Ambulatory Visit: Payer: Self-pay

## 2022-10-04 ENCOUNTER — Ambulatory Visit: Payer: Medicaid Other

## 2022-10-04 DIAGNOSIS — R252 Cramp and spasm: Secondary | ICD-10-CM

## 2022-10-04 DIAGNOSIS — M542 Cervicalgia: Secondary | ICD-10-CM
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
## 2022-10-15 ENCOUNTER — Ambulatory Visit: Payer: Medicaid Other | Attending: Student | Admitting: Physical Therapy

## 2022-10-24 ENCOUNTER — Ambulatory Visit (INDEPENDENT_AMBULATORY_CARE_PROVIDER_SITE_OTHER): Payer: Medicaid Other | Admitting: Podiatry

## 2022-10-24 ENCOUNTER — Ambulatory Visit: Payer: Medicaid Other | Admitting: Physical Therapy

## 2022-10-24 DIAGNOSIS — Z91199 Patient's noncompliance with other medical treatment and regimen due to unspecified reason: Secondary | ICD-10-CM

## 2022-10-27 NOTE — Progress Notes (Signed)
Patient was no-show for appointment today 

## 2022-11-14 ENCOUNTER — Other Ambulatory Visit: Payer: Self-pay | Admitting: Student

## 2022-11-14 DIAGNOSIS — M5412 Radiculopathy, cervical region: Secondary | ICD-10-CM

## 2022-12-06 ENCOUNTER — Emergency Department (HOSPITAL_BASED_OUTPATIENT_CLINIC_OR_DEPARTMENT_OTHER)
Admission: EM | Admit: 2022-12-06 | Discharge: 2022-12-06 | Disposition: A | Discharge status: Home / Self Care | Payer: Medicaid Other | Attending: Emergency Medicine | Admitting: Emergency Medicine

## 2022-12-06 ENCOUNTER — Other Ambulatory Visit (HOSPITAL_BASED_OUTPATIENT_CLINIC_OR_DEPARTMENT_OTHER): Payer: Self-pay

## 2022-12-06 ENCOUNTER — Emergency Department (HOSPITAL_BASED_OUTPATIENT_CLINIC_OR_DEPARTMENT_OTHER): Payer: Medicaid Other

## 2022-12-06 ENCOUNTER — Encounter (HOSPITAL_BASED_OUTPATIENT_CLINIC_OR_DEPARTMENT_OTHER): Payer: Self-pay

## 2022-12-06 DIAGNOSIS — R0602 Shortness of breath: Secondary | ICD-10-CM | POA: Diagnosis present

## 2022-12-06 DIAGNOSIS — J45909 Unspecified asthma, uncomplicated: Secondary | ICD-10-CM | POA: Diagnosis not present

## 2022-12-06 DIAGNOSIS — J4520 Mild intermittent asthma, uncomplicated: Secondary | ICD-10-CM | POA: Diagnosis not present

## 2022-12-06 DIAGNOSIS — I10 Essential (primary) hypertension: Secondary | ICD-10-CM | POA: Diagnosis not present

## 2022-12-06 DIAGNOSIS — R0600 Dyspnea, unspecified: Secondary | ICD-10-CM | POA: Insufficient documentation

## 2022-12-06 MED ORDER — PREDNISONE 50 MG PO TABS
50.0000 mg | ORAL_TABLET | Freq: Every day | ORAL | 0 refills | Status: DC
  Filled 2022-12-06: qty 5, 5d supply, fill #0

## 2022-12-06 NOTE — ED Provider Notes (Signed)
[facility] Provider Note   CSN: [id number] Arrival date & time: 12/06/22  0800     History  Chief Complaint  Patient presents with   Shortness of Breath    Caleb Hunter is a 57 y.o. male.   Shortness of Breath    Patient has a history of hypertension hyperlipidemia asthma, reflux, anxiety who presents to the ED with complaints of shortness of breath.  Patient states he has had trouble with shortness of breath over the last couple weeks.  He has been using his nebulizer inhaler without much improvement.  Patient's states when he gets like this he usually gets better with a course of steroids.  He has had some tightness in his chest.  He is not having any chest pain.  He denies any leg swelling.  No fevers.  No history of DVT or PE.  Home Medications Prior to Admission medications   Medication Sig Start Date End Date Taking? Authorizing Provider  predniSONE (DELTASONE) 50 MG tablet Take 1 tablet (50 mg total) by mouth daily. 12/06/22  Yes Linwood Dibbles, MD  albuterol (PROVENTIL) (2.5 MG/3ML) 0.083% nebulizer solution Take 2.5 mg by nebulization every 6 (six) hours as needed for wheezing or shortness of breath.    [provider]  albuterol (VENTOLIN HFA) 108 (90 Base) MCG/ACT inhaler Inhale 2 puffs into the lungs every 4 (four) hours as needed for shortness of breath or wheezing.    [provider]  allopurinol (ZYLOPRIM) 300 MG tablet Take 300 mg by mouth daily as needed (gout). 12/14/20   [provider]  amLODipine (NORVASC) 10 MG tablet Take 10 mg by mouth daily. 10/21/17   [provider]  atenolol-chlorthalidone (TENORETIC) 50-25 MG tablet Take 1 tablet by mouth daily.    [provider]  atorvastatin (LIPITOR) 80 MG tablet Take 40 mg by mouth at bedtime.    [provider]  budesonide-formoterol (SYMBICORT) 80-4.5 MCG/ACT inhaler Inhale 2 puffs into the lungs 2 (two) times daily.  07/27/20   [provider]  clobetasol cream (TEMOVATE) 0.05 % Apply 1 application topically 2 (two) times daily as needed (irritation).    [provider]  Colchicine 0.6 MG CAPS Take 1 tablet by mouth daily as needed (gout).    [provider]  EPINEPHrine 0.3 mg/0.3 mL IJ SOAJ injection Inject 0.3 mg into the muscle as needed for anaphylaxis. 04/26/20   [provider]  gabapentin (NEURONTIN) 300 MG capsule Take 1 capsule (300 mg total) by mouth 3 (three) times daily for 7 days. 03/02/22 03/09/22  McDonald, Rachelle Hora, DPM  montelukast (SINGULAIR) 10 MG tablet Take 10 mg by mouth at bedtime. 06/07/15   [provider]  Olopatadine HCl 0.2 % SOLN Place 1 drop into both eyes daily as needed (eye allergies). 12/25/19   [provider]  omeprazole (PRILOSEC) 20 MG capsule Take 20 mg by mouth daily. 08/01/20   [provider]  oxyCODONE-acetaminophen (PERCOCET) 10-325 MG tablet Take 1 tablet by mouth 4 (four) times daily as needed for pain. 08/29/20   [provider]      Allergies    Lisinopril and Shellfish allergy    Review of Systems   Review of Systems  Respiratory:  Positive for shortness of breath.     Physical Exam Updated Vital Signs BP (!) 142/87 (BP Location: Right Arm)   Pulse 80   Temp 97.8 F (36.6 C) (Oral)   Resp 20  Ht 1.905 m (6\' 3" )   Wt 126.1 kg   SpO2 97%   BMI 34.75 kg/m  Physical Exam Vitals and nursing note reviewed.  Constitutional:      General: He is not in acute distress.    Appearance: He is well-developed.  HENT:     Head: Normocephalic and atraumatic.     Right Ear: External ear normal.     Left Ear: External ear normal.  Eyes:     General: No scleral icterus.       Right eye: No discharge.        Left eye: No discharge.     Conjunctiva/sclera: Conjunctivae normal.  Neck:     Trachea: No tracheal deviation.  Cardiovascular:     Rate and Rhythm: Normal rate and regular rhythm.   Pulmonary:     Effort: Pulmonary effort is normal. No respiratory distress.     Breath sounds: Normal breath sounds. No stridor. No wheezing, rhonchi or rales.  Abdominal:     General: Bowel sounds are normal. There is no distension.     Palpations: Abdomen is soft.     Tenderness: There is no abdominal tenderness. There is no guarding or rebound.  Musculoskeletal:        General: No tenderness or deformity.     Cervical back: Neck supple.     Right lower leg: No tenderness. No edema.     Left lower leg: No tenderness. No edema.  Skin:    General: Skin is warm and dry.     Findings: No rash.  Neurological:     General: No focal deficit present.     Mental Status: He is alert.     Cranial Nerves: No cranial nerve deficit, dysarthria or facial asymmetry.     Sensory: No sensory deficit.     Motor: No abnormal muscle tone or seizure activity.     Coordination: Coordination normal.  Psychiatric:        Mood and Affect: Mood normal.     ED Results / Procedures / Treatments   Labs (all labs ordered are listed, but only abnormal results are displayed) Labs Reviewed - No data to display  EKG EKG Interpretation  Date/Time:  Thursday Dec 06 2022 08:24:56 EDT Ventricular Rate:  74 PR Interval:  184 QRS Duration: 99 QT Interval:  410 QTC Calculation: 455 R Axis:   -13 Text Interpretation: Sinus rhythm Probable left atrial enlargement No significant change since last tracing Confirmed by Linwood Dibbles [phone]) on 12/06/2022 8:33:11 AM  Radiology DG Chest 2 View  Result Date: 12/06/2022 CLINICAL DATA:  Shortness of breath EXAM: CHEST - 2 VIEW COMPARISON:  01/30/2022 FINDINGS: The heart size and mediastinal contours are within normal limits. Both lungs are clear. The visualized skeletal structures are unremarkable. IMPRESSION: No acute abnormality of the lungs. Electronically Signed   By: Jearld Lesch M.D.   On: 12/06/2022 08:27    Procedures Procedures    Medications Ordered in  ED Medications - No data to display  ED Course/ Medical Decision Making/ A&P Clinical Course as of 12/06/22 0850  Thu Dec 06, 2022  3086 Chest x-ray without signs of pneumonia [JK]    Clinical Course User Index [JK] Linwood Dibbles, MD                             Medical Decision Making Parental diagnosis includes but not limited to pneumonia pneumothorax CHF, asthma  Problems Addressed: Dyspnea, unspecified type: acute illness or injury that poses a threat to life or bodily functions  Amount and/or Complexity of Data Reviewed Radiology: ordered and independent interpretation performed.  Risk Prescription drug management.   Patient's ED workup is reassuring.  X-ray does not show pneumonia or pneumothorax.  No signs of CHF.  Patient is low risk for PE PERC negative.  EKG not suggestive of cardiac ischemia.  I do not hear any definite wheezing on exam at this time but patient states he has had similar exacerbations like this with his asthma.  He is requesting a steroid prescription.  Will go ahead and give him a short course of steroids.  Recommend follow-up with primary care doctor to be rechecked if symptoms do not resolve        Final Clinical Impression(s) / ED Diagnoses Final diagnoses:  Dyspnea, unspecified type  Intermittent asthma, unspecified asthma severity, unspecified whether complicated    Rx / DC Orders ED Discharge Orders          Ordered    predniSONE (DELTASONE) 50 MG tablet  Daily        12/06/22 0848              Linwood Dibbles, MD 12/06/22 [phone]

## 2022-12-06 NOTE — Discharge Instructions (Signed)
Continue your current medications.  Take the steroid burst to help with your asthma.  Follow-up with your primary care doctor to be rechecked if the symptoms do not resolve over the next week.  Return as needed for worsening symptoms

## 2022-12-06 NOTE — ED Triage Notes (Signed)
C/o asthma & shortness of breath x 2 weeks. States using nebulizer and inhaler without relief. Last used nebulizer 1 hr pta. States "needs steroids". NAD in triage. Denies recent illness.

## 2023-01-08 ENCOUNTER — Ambulatory Visit
Admission: RE | Admit: 2023-01-08 | Discharge: 2023-01-08 | Disposition: A | Discharge status: Home / Self Care | Payer: Medicaid Other | Source: Ambulatory Visit | Attending: Student | Admitting: Student

## 2023-01-08 DIAGNOSIS — M5412 Radiculopathy, cervical region: Secondary | ICD-10-CM

## 2023-02-04 ENCOUNTER — Encounter: Payer: Self-pay | Admitting: Podiatry

## 2023-02-04 ENCOUNTER — Ambulatory Visit: Payer: Medicaid Other | Admitting: Podiatry

## 2023-02-04 ENCOUNTER — Ambulatory Visit (INDEPENDENT_AMBULATORY_CARE_PROVIDER_SITE_OTHER): Payer: Medicaid Other

## 2023-02-04 VITALS — BP 146/93 | HR 79

## 2023-02-04 DIAGNOSIS — M2012 Hallux valgus (acquired), left foot: Secondary | ICD-10-CM | POA: Diagnosis not present

## 2023-02-04 DIAGNOSIS — T8484XA Pain due to internal orthopedic prosthetic devices, implants and grafts, initial encounter: Secondary | ICD-10-CM

## 2023-02-04 MED ORDER — IBUPROFEN 800 MG PO TABS: 800.0000 mg | ORAL_TABLET | Freq: Three times a day (TID) | ORAL | 0 refills | Status: DC | PRN

## 2023-02-04 NOTE — Progress Notes (Signed)
  Subjective:  Patient ID: Caleb Hunter, male    DOB: Jul 21, 1965,  MRN: [id number]  Chief Complaint  Patient presents with   Post-op Problem    "It's not good.  I want all of this metal out of my foot.  It's so tender and uncomfortable."    57 y.o. male presents with the above complaint. History confirmed with patient.  Still giving him quite a bit of pain.  He would like to have the hardware removed.  Objective:  Physical Exam: warm, good capillary refill, no trophic changes or ulcerative lesions, normal DP and PT pulses, normal sensory exam, and well-healed not hypertrophic surgical scars of first ray and second interspace, tenderness to palpation around these areas   Radiographs: Multiple views x-ray of the left foot: Well-healed fusion site of first MPJ, well-healed Weil osteotomy second and third metatarsal head Assessment:   1. Hav (hallux abducto valgus), left   2. Pain due to internal orthopedic prosthetic devices, implants and grafts, initial encounter [facility])      Plan:  Patient was evaluated and treated and all questions answered.  We discussed further treatment.  He would like to have the hardware removed.  We discussed the risk benefits and potential complications of proceeding with this.  We discussed the risk of infection from the scar tissue and nerve entrapment.  Informed consent was signed and reviewed.  All questions were addressed.   Surgical plan:  Procedure: -Removal of hardware for second and third metatarsals left foot  Location: -[facility]  Anesthesia plan: -IV sedation with local  Postoperative pain plan: - Tylenol 1000 mg every 6 hours, ibuprofen 600 mg every 6 hours, gabapentin 300 mg every 8 hours x5 days, oxycodone 5 mg 1-2 tabs every 6 hours only as needed  DVT prophylaxis: -None required  WB Restrictions / DME needs: -WBAT in surgical shoe postop   No follow-ups on file.

## 2023-02-12 ENCOUNTER — Telehealth: Payer: Self-pay | Admitting: Urology

## 2023-02-12 NOTE — Telephone Encounter (Signed)
DOS - 02/22/23  REMOVAL FIXATION DEEP RIGHT --- 20680  [facility]   RECEIVED A FAX FROM [facility] STATING THAT CPT CODE 40981 HAS BEEN APPROVED, AUTH # [id number] FROM 02/22/23 - 04/24/23.

## 2023-02-19 ENCOUNTER — Ambulatory Visit: Payer: Medicaid Other | Admitting: Podiatry

## 2023-02-21 ENCOUNTER — Other Ambulatory Visit: Payer: Self-pay | Admitting: Student

## 2023-02-21 DIAGNOSIS — M25512 Pain in left shoulder: Secondary | ICD-10-CM

## 2023-02-22 ENCOUNTER — Other Ambulatory Visit: Payer: Self-pay | Admitting: Podiatry

## 2023-02-22 DIAGNOSIS — M2012 Hallux valgus (acquired), left foot: Secondary | ICD-10-CM | POA: Diagnosis not present

## 2023-02-22 MED ORDER — OXYCODONE HCL 5 MG PO TABS: 5.0000 mg | ORAL_TABLET | Freq: Four times a day (QID) | ORAL | 0 refills | Status: AC | PRN

## 2023-02-22 NOTE — Progress Notes (Signed)
8/16 left foot hardware removal

## 2023-02-28 ENCOUNTER — Encounter: Payer: Self-pay | Admitting: Podiatry

## 2023-03-09 ENCOUNTER — Emergency Department (HOSPITAL_BASED_OUTPATIENT_CLINIC_OR_DEPARTMENT_OTHER)
Admission: EM | Admit: 2023-03-09 | Discharge: 2023-03-09 | Discharge status: Against Medical Advice | Payer: Medicaid Other

## 2023-03-09 NOTE — ED Notes (Addendum)
I went to call the patient for triage. He stated he felt better and was leaving. Patient was alert and breathing normally.

## 2023-03-14 ENCOUNTER — Ambulatory Visit (INDEPENDENT_AMBULATORY_CARE_PROVIDER_SITE_OTHER): Payer: Medicaid Other | Admitting: Podiatry

## 2023-03-14 DIAGNOSIS — M2012 Hallux valgus (acquired), left foot: Secondary | ICD-10-CM

## 2023-03-14 DIAGNOSIS — T8484XA Pain due to internal orthopedic prosthetic devices, implants and grafts, initial encounter: Secondary | ICD-10-CM

## 2023-03-14 MED ORDER — IBUPROFEN 800 MG PO TABS
800.0000 mg | ORAL_TABLET | Freq: Three times a day (TID) | ORAL | 0 refills | Status: AC | PRN
Start: 2023-03-14 — End: ?

## 2023-03-14 NOTE — Progress Notes (Signed)
  Subjective:  Patient ID: Caleb Hunter, male    DOB: 1966-02-17,  MRN: [id number]  Chief Complaint  Patient presents with   Routine Post Op    POV # 2 DOS 02/22/23 ---REMOVAL OF HARDWARE FROM LEFT FOOT(DR.MCDONALD PATIENT). Sutures are intact. Has some pain to incision area. Is using surgical shoe for ambulation.      57 y.o. male presents with the above complaint.  He presents here for suture removal.  Still in some pain.  Denies any fevers or chills.  Objective:  Physical Exam: General: AAO x3, NAD  Dermatological: Incision well coapted with sutures intact but is starting to scab over.  But removed the incision is well coapted.  There is minimal edema.  No erythema monitoring drainage or pus or any signs of infection.  No open lesions.  Vascular: Dorsalis Pedis artery and Posterior Tibial artery pedal pulses are 2/4 bilateral with immedate capillary fill time. There is no pain with calf compression, swelling, warmth, erythema.   Neruologic: Grossly intact via light touch bilateral.   Musculoskeletal: Mild discomfort at surgical site.  No other areas of discomfort noted. Gait: Unassisted, Nonantalgic.     Assessment:   1. Hav (hallux abducto valgus), left   2. Pain due to internal orthopedic prosthetic devices, implants and grafts, initial encounter [facility])      Plan:  Patient was evaluated and treated and all questions answered.  Sutures removed today.  Antibiotic ointment and dressing applied.  Discussed that she can start to wash with soap and water, dry thoroughly and apply a similar bandage.  Remain in surgical shoe for now.  Continue ice, elevate as well as compression to help with residual edema. Monitor for any clinical signs or symptoms of infection and directed to call the office immediately should any occur or go to the ER.  Follow-up in 2 weeks with Dr. Lilian Kapur or sooner if needed.  Vivi Barrack DPM

## 2023-03-28 ENCOUNTER — Encounter: Payer: Self-pay | Admitting: Student

## 2023-03-30 ENCOUNTER — Other Ambulatory Visit: Payer: Medicaid Other

## 2023-04-04 ENCOUNTER — Encounter: Payer: Self-pay | Admitting: Podiatry

## 2023-04-09 ENCOUNTER — Encounter: Payer: Self-pay | Admitting: Podiatry

## 2023-04-09 ENCOUNTER — Ambulatory Visit: Payer: Medicaid Other | Admitting: Podiatry

## 2023-04-09 DIAGNOSIS — T8484XA Pain due to internal orthopedic prosthetic devices, implants and grafts, initial encounter: Secondary | ICD-10-CM

## 2023-04-09 MED ORDER — IBUPROFEN 800 MG PO TABS: 800.0000 mg | ORAL_TABLET | Freq: Three times a day (TID) | ORAL | 2 refills | Status: DC | PRN

## 2023-04-09 NOTE — Progress Notes (Signed)
Subjective:  Patient ID: Caleb Hunter, male    DOB: [birth year],  MRN: [id number]  Chief Complaint  Patient presents with   Routine Post Op    Visit # 3, DOS 02/22/23    DOS: 02/22/2023 Procedure: Removal of left foot hardware  57 y.o. male returns for post-op check.  Overall better than he was prior to having the hardware removed but still feels very stiff and he does get nerve pain still  Review of Systems: Negative except as noted in the HPI. Denies N/V/F/Ch.   Objective:  There were no vitals filed for this visit. There is no height or weight on file to calculate BMI. Constitutional Well developed. Well nourished.  Vascular Foot warm and well perfused. Capillary refill normal to all digits.  Calf is soft and supple, no posterior calf or knee pain, negative Homans' sign  Neurologic Normal speech. Oriented to person, place, and time. Epicritic sensation to light touch grossly present bilaterally.  Dermatologic Incisions are well-healed there is minimal hypertrophy  Orthopedic: Tenderness around lesser second and third MTPJ's with limited range of motion    Assessment:   1. Pain due to internal orthopedic prosthetic devices, implants and grafts, initial encounter [facility])    Plan:  Patient was evaluated and treated and all questions answered.  S/p foot surgery left -Progressing as expected post-operatively.  Does have some postoperative stiffness we discussed that multiple surgeries in this area have led to increase scar tissue formation I would like him to continue range of motion exercises manually active and passive, discussed that this likely will take a full year to fully alleviate the scar tissue, I do not have any restrictions for him to be WBAT in regular shoe gear.  Continue ibuprofen as needed and a refill for this was sent for him.  He also has gabapentin that he will trial to see if this alleviates any further of the nerve pain.  Return to see me as needed  Return if  symptoms worsen or fail to improve.

## 2023-05-07 ENCOUNTER — Emergency Department (HOSPITAL_BASED_OUTPATIENT_CLINIC_OR_DEPARTMENT_OTHER)
Admission: EM | Admit: 2023-05-07 | Discharge: 2023-05-07 | Disposition: A | Discharge status: Home / Self Care | Payer: Medicaid Other | Attending: Emergency Medicine | Admitting: Emergency Medicine

## 2023-05-07 ENCOUNTER — Encounter (HOSPITAL_BASED_OUTPATIENT_CLINIC_OR_DEPARTMENT_OTHER): Payer: Self-pay

## 2023-05-07 ENCOUNTER — Emergency Department (HOSPITAL_BASED_OUTPATIENT_CLINIC_OR_DEPARTMENT_OTHER): Payer: Medicaid Other

## 2023-05-07 ENCOUNTER — Other Ambulatory Visit: Payer: Self-pay

## 2023-05-07 DIAGNOSIS — I10 Essential (primary) hypertension: Secondary | ICD-10-CM | POA: Insufficient documentation

## 2023-05-07 DIAGNOSIS — J4531 Mild persistent asthma with (acute) exacerbation: Secondary | ICD-10-CM | POA: Insufficient documentation

## 2023-05-07 DIAGNOSIS — R1084 Generalized abdominal pain: Secondary | ICD-10-CM | POA: Diagnosis present

## 2023-05-07 DIAGNOSIS — M7989 Other specified soft tissue disorders: Secondary | ICD-10-CM | POA: Insufficient documentation

## 2023-05-07 DIAGNOSIS — Z7951 Long term (current) use of inhaled steroids: Secondary | ICD-10-CM | POA: Diagnosis not present

## 2023-05-07 DIAGNOSIS — Z79899 Other long term (current) drug therapy: Secondary | ICD-10-CM | POA: Diagnosis not present

## 2023-05-07 DIAGNOSIS — E876 Hypokalemia: Secondary | ICD-10-CM

## 2023-05-07 DIAGNOSIS — Z1152 Encounter for screening for COVID-19: Secondary | ICD-10-CM | POA: Insufficient documentation

## 2023-05-07 DIAGNOSIS — J45901 Unspecified asthma with (acute) exacerbation: Secondary | ICD-10-CM

## 2023-05-07 DIAGNOSIS — K5900 Constipation, unspecified: Secondary | ICD-10-CM

## 2023-05-07 DIAGNOSIS — R2242 Localized swelling, mass and lump, left lower limb: Secondary | ICD-10-CM

## 2023-05-07 LAB — COMPREHENSIVE METABOLIC PANEL
ALT: 54 U/L — ABNORMAL HIGH (ref 0–44)
AST: 88 U/L — ABNORMAL HIGH (ref 15–41)
Albumin: 3.7 g/dL (ref 3.5–5.0)
Alkaline Phosphatase: 93 U/L (ref 38–126)
Anion gap: 13 (ref 5–15)
BUN: 20 mg/dL (ref 6–20)
CO2: 24 mmol/L (ref 22–32)
Calcium: 8.6 mg/dL — ABNORMAL LOW (ref 8.9–10.3)
Chloride: 96 mmol/L — ABNORMAL LOW (ref 98–111)
Creatinine, Ser: 0.96 mg/dL (ref 0.61–1.24)
GFR, Estimated: >60 mL/min (ref >60)
Glucose, Bld: 179 mg/dL — ABNORMAL HIGH (ref 70–99)
Potassium: 3 mmol/L — ABNORMAL LOW (ref 3.5–5.1)
Sodium: 133 mmol/L — ABNORMAL LOW (ref 135–145)
Total Bilirubin: 1.1 mg/dL (ref 0.3–1.2)
Total Protein: 7.6 g/dL (ref 6.5–8.1)

## 2023-05-07 LAB — CBC WITH DIFFERENTIAL/PLATELET
Abs Immature Granulocytes: 0.04 10*3/uL (ref 0.00–0.07)
Basophils Absolute: 0.1 10*3/uL (ref 0.0–0.1)
Basophils Relative: 1 %
Eosinophils Absolute: 0.3 10*3/uL (ref 0.0–0.5)
Eosinophils Relative: 3 %
HCT: 45.2 % (ref 39.0–52.0)
Hemoglobin: 15.1 g/dL (ref 13.0–17.0)
Immature Granulocytes: 1 %
Lymphocytes Relative: 31 %
Lymphs Abs: 2.6 10*3/uL (ref 0.7–4.0)
MCH: 27.4 pg (ref 26.0–34.0)
MCHC: 33.4 g/dL (ref 30.0–36.0)
MCV: 82 fL (ref 80.0–100.0)
Monocytes Absolute: 0.6 10*3/uL (ref 0.1–1.0)
Monocytes Relative: 7 %
Neutro Abs: 4.8 10*3/uL (ref 1.7–7.7)
Neutrophils Relative %: 57 %
Platelets: 134 10*3/uL — ABNORMAL LOW (ref 150–400)
RBC: 5.51 MIL/uL (ref 4.22–5.81)
RDW: 14.6 % (ref 11.5–15.5)
WBC: 8.3 10*3/uL (ref 4.0–10.5)
nRBC: 0 % (ref 0.0–0.2)

## 2023-05-07 LAB — URINALYSIS, MICROSCOPIC (REFLEX)

## 2023-05-07 LAB — RESP PANEL BY RT-PCR (RSV, FLU A&B, COVID)  RVPGX2
Influenza A by PCR: NEGATIVE
Influenza B by PCR: NEGATIVE
Resp Syncytial Virus by PCR: NEGATIVE
SARS Coronavirus 2 by RT PCR: NEGATIVE

## 2023-05-07 LAB — URINALYSIS, ROUTINE W REFLEX MICROSCOPIC
Bilirubin Urine: NEGATIVE
Glucose, UA: NEGATIVE mg/dL
Ketones, ur: NEGATIVE mg/dL
Leukocytes,Ua: NEGATIVE
Nitrite: NEGATIVE
Protein, ur: 30 mg/dL — AB
Specific Gravity, Urine: >=1.03 (ref 1.005–1.030)
pH: 6 (ref 5.0–8.0)

## 2023-05-07 LAB — C-REACTIVE PROTEIN: CRP: 1.6 mg/dL — ABNORMAL HIGH (ref <1.0)

## 2023-05-07 LAB — LIPASE, BLOOD: Lipase: 24 U/L (ref 11–51)

## 2023-05-07 LAB — MAGNESIUM: Magnesium: 1.5 mg/dL — ABNORMAL LOW (ref 1.7–2.4)

## 2023-05-07 LAB — SEDIMENTATION RATE: Sed Rate: 4 mm/h (ref 0–16)

## 2023-05-07 MED ORDER — METHYLPREDNISOLONE SODIUM SUCC 125 MG IJ SOLR
125.0000 mg | Freq: Once | INTRAMUSCULAR | Status: AC
  Administered 2023-05-07: 125 mg via INTRAVENOUS
  Filled 2023-05-07: qty 2

## 2023-05-07 MED ORDER — POLYETHYLENE GLYCOL 3350 17 GM/SCOOP PO POWD: 1.0000 | Freq: Once | ORAL | 0 refills | Status: AC

## 2023-05-07 MED ORDER — POTASSIUM CHLORIDE CRYS ER 20 MEQ PO TBCR: 40.0000 meq | EXTENDED_RELEASE_TABLET | Freq: Two times a day (BID) | ORAL | 0 refills | Status: DC

## 2023-05-07 MED ORDER — IOHEXOL 300 MG/ML  SOLN
100.0000 mL | Freq: Once | INTRAMUSCULAR | Status: AC | PRN
  Administered 2023-05-07: 100 mL via INTRAVENOUS

## 2023-05-07 MED ORDER — IPRATROPIUM-ALBUTEROL 0.5-2.5 (3) MG/3ML IN SOLN
3.0000 mL | RESPIRATORY_TRACT | Status: AC
Start: 2023-05-07 — End: 2023-05-07
  Administered 2023-05-07 (×3): 3 mL via RESPIRATORY_TRACT
  Filled 2023-05-07: qty 9

## 2023-05-07 MED ORDER — FENTANYL CITRATE PF 50 MCG/ML IJ SOSY
50.0000 ug | PREFILLED_SYRINGE | Freq: Once | INTRAMUSCULAR | Status: AC
  Administered 2023-05-07: 50 ug via INTRAVENOUS
  Filled 2023-05-07: qty 1

## 2023-05-07 MED ORDER — DICYCLOMINE HCL 20 MG PO TABS: 20.0000 mg | ORAL_TABLET | Freq: Two times a day (BID) | ORAL | 0 refills | Status: AC

## 2023-05-07 MED ORDER — PREDNISONE 10 MG PO TABS
40.0000 mg | ORAL_TABLET | Freq: Every day | ORAL | 0 refills | Status: AC
Start: 2023-05-07 — End: 2023-05-12

## 2023-05-07 MED ORDER — MAGNESIUM OXIDE -MG SUPPLEMENT 400 (240 MG) MG PO TABS: 400.0000 mg | ORAL_TABLET | Freq: Every day | ORAL | 0 refills | Status: AC

## 2023-05-07 MED ORDER — POTASSIUM CHLORIDE CRYS ER 20 MEQ PO TBCR
40.0000 meq | EXTENDED_RELEASE_TABLET | Freq: Once | ORAL | Status: AC
  Administered 2023-05-07: 40 meq via ORAL
  Filled 2023-05-07: qty 2

## 2023-05-07 NOTE — ED Notes (Signed)
Urine specimen in lab 

## 2023-05-07 NOTE — Discharge Instructions (Addendum)
Your CT imaging was overall reassuring.  Your symptoms are consistent with likely abdominal cramping and discomfort in the setting of constipation.  Recommend you trial a MiraLAX bowel regimen at home, try 4 capfuls of MiraLAX in a 32 ounce Gatorade or similar beverage of similar size, drink over 4 hours, can repeat the following day for a satisfactory bowel movement.  Bentyl has been prescribed for crampy discomfort.  Minimize any opiate use as this can contribute to constipation, continue to orally rehydrate with increasing fluid intake.  Your potassium also could be artificially low in the setting of your home nebulizer use.  Your x-ray imaging of the foot was overall reassuring and you have no evidence of infection on laboratory or imaging evaluation.  Your symptoms of asthma exacerbation could be triggered by other viral upper respiratory infection or seasonal allergies and improved after treatment in the ER.  Will discharge on a course of prednisone.  Regarding your asthma, you had mild symptoms of an asthma exacerbation and we will discharge you on a course of prednisone.    Your x-ray of your foot showed mild swelling but no evidence of infection in your foot and on exam there was no evidence in your laboratory evaluation points away from an infectious etiology.  Follow-up with your podiatrist.

## 2023-05-07 NOTE — ED Provider Notes (Signed)
EMERGENCY DEPARTMENT AT [facility] Provider Note   CSN: [id number] Arrival date & time: 05/07/23  4010     History  Chief Complaint  Patient presents with   Abdominal Pain    Caleb Hunter is a 57 y.o. male.   Abdominal Pain Associated symptoms: constipation and nausea      57 year old male with medical history significant for hypertension, depression, hyperlipidemia, asthma, GERD, anxiety who presents to the emergency department with multiple complaints.  The patient states that 1) he has had crampy abdominal discomfort and symptoms of constipation with associated nausea for the past 3 months.  He is passing gas.  He moves his bowels but he is not moving his bowels as regularly.  He tried a small amount of MiraLAX for this but not a full bowel regimen.  He has not tried anything else and has not followed up regarding this.  He denies any genitourinary symptoms.  He denies any vomiting and he is tolerating oral intake.  He also states that 2) he is having symptoms of nasal congestion, allergies and worsening shortness of breath which she thinks is triggering an asthma exacerbation.  He endorses worsening wheezing.  He has a nebulizer at home which has not been helping.  He denies any chest pain.  He additionally states that 3) he is having some localized swelling to the dorsum of his left foot.  He denies any falls or trauma.  He had a fusion of his metatarsal phalangeal joints and follows outpatient for this.  He denies any fever, chills, redness to the foot.   Home Medications Prior to Admission medications   Medication Sig Start Date End Date Taking? Authorizing Provider  dicyclomine (BENTYL) 20 MG tablet Take 1 tablet (20 mg total) by mouth 2 (two) times daily. 05/07/23  Yes Ernie Avena, MD  magnesium oxide (MAGOX 400) 400 (240 Mg) MG tablet Take 1 tablet (400 mg total) by mouth daily for 3 days. 05/07/23 05/10/23 Yes Ernie Avena, MD  polyethylene glycol  powder (GLYCOLAX/MIRALAX) 17 GM/SCOOP powder Take 255 g by mouth once for 1 dose. 05/07/23 05/07/23 Yes Ernie Avena, MD  potassium chloride SA (KLOR-CON M) 20 MEQ tablet Take 2 tablets (40 mEq total) by mouth 2 (two) times daily for 3 days. 05/07/23 05/10/23 Yes Ernie Avena, MD  predniSONE (DELTASONE) 10 MG tablet Take 4 tablets (40 mg total) by mouth daily for 5 days. 05/07/23 05/12/23 Yes Ernie Avena, MD  albuterol (PROVENTIL) (2.5 MG/3ML) 0.083% nebulizer solution Take 2.5 mg by nebulization every 6 (six) hours as needed for wheezing or shortness of breath.    [provider]  albuterol (VENTOLIN HFA) 108 (90 Base) MCG/ACT inhaler Inhale 2 puffs into the lungs every 4 (four) hours as needed for shortness of breath or wheezing.    [provider]  allopurinol (ZYLOPRIM) 300 MG tablet Take 300 mg by mouth daily as needed (gout). 12/14/20   [provider]  amLODipine (NORVASC) 10 MG tablet Take 10 mg by mouth daily. 10/21/17   [provider]  atenolol-chlorthalidone (TENORETIC) 50-25 MG tablet Take 1 tablet by mouth daily.    [provider]  atorvastatin (LIPITOR) 80 MG tablet Take 40 mg by mouth at bedtime.    [provider]  budesonide-formoterol (SYMBICORT) 80-4.5 MCG/ACT inhaler Inhale 2 puffs into the lungs 2 (two) times daily. 07/27/20   [provider]  clobetasol cream (TEMOVATE) 0.05 % Apply 1 application topically 2 (two) times daily as  needed (irritation).    [provider]  Colchicine 0.6 MG CAPS Take 1 tablet by mouth daily as needed (gout). Patient not taking: Reported on 02/04/2023    [provider]  EPINEPHrine 0.3 mg/0.3 mL IJ SOAJ injection Inject 0.3 mg into the muscle as needed for anaphylaxis. 04/26/20   [provider]  gabapentin (NEURONTIN) 300 MG capsule Take 1 capsule (300 mg total) by mouth 3 (three) times daily for 7 days. 03/02/22 03/09/22  McDonald, Rachelle Hora, DPM  ibuprofen  (ADVIL) 800 MG tablet Take 1 tablet (800 mg total) by mouth every 8 (eight) hours as needed. 04/09/23 07/08/23  McDonald, Rachelle Hora, DPM  montelukast (SINGULAIR) 10 MG tablet Take 10 mg by mouth at bedtime. 06/07/15   [provider]  Olopatadine HCl 0.2 % SOLN Place 1 drop into both eyes daily as needed (eye allergies). 12/25/19   [provider]  omeprazole (PRILOSEC) 20 MG capsule Take 20 mg by mouth daily. 08/01/20   [provider]  oxyCODONE-acetaminophen (PERCOCET) 10-325 MG tablet Take 1 tablet by mouth 4 (four) times daily as needed for pain. 08/29/20   [provider]      Allergies    Lisinopril and Shellfish allergy    Review of Systems   Review of Systems  Gastrointestinal:  Positive for abdominal pain, constipation and nausea.  All other systems reviewed and are negative.   Physical Exam Updated Vital Signs BP (!) 137/91   Pulse 80   Temp 98.1 F (36.7 C) (Oral)   Resp 18   Ht 6\' 4"  (1.93 m)   Wt 123.4 kg   SpO2 93%   BMI 33.12 kg/m  Physical Exam Vitals and nursing note reviewed.  Constitutional:      General: He is not in acute distress.    Appearance: He is well-developed.  HENT:     Head: Normocephalic and atraumatic.  Eyes:     Conjunctiva/sclera: Conjunctivae normal.  Cardiovascular:     Rate and Rhythm: Normal rate and regular rhythm.     Heart sounds: No murmur heard. Pulmonary:     Effort: Pulmonary effort is normal. No respiratory distress.     Breath sounds: Normal breath sounds.     Comments: Mild expiratory wheezing bilaterally, no tachypnea, no respiratory distress Abdominal:     Palpations: Abdomen is soft.     Tenderness: There is generalized abdominal tenderness. There is no guarding or rebound.  Musculoskeletal:        General: No swelling.     Cervical back: Neck supple.     Comments: 2+ DP pulses bilaterally, mild tenderness about the dorsum of the left foot, no erythema or warmth, no fluctuance  Skin:     General: Skin is warm and dry.     Capillary Refill: Capillary refill takes less than 2 seconds.  Neurological:     Mental Status: He is alert.  Psychiatric:        Mood and Affect: Mood normal.     ED Results / Procedures / Treatments   Labs (all labs ordered are listed, but only abnormal results are displayed) Labs Reviewed  CBC WITH DIFFERENTIAL/PLATELET - Abnormal; Notable for the following components:      Result Value   Platelets 134 (*)    All other components within normal limits  COMPREHENSIVE METABOLIC PANEL - Abnormal; Notable for the following components:   Sodium 133 (*)    Potassium 3.0 (*)    Chloride 96 (*)  Glucose, Bld 179 (*)    Calcium 8.6 (*)    AST 88 (*)    ALT 54 (*)    All other components within normal limits  URINALYSIS, ROUTINE W REFLEX MICROSCOPIC - Abnormal; Notable for the following components:   Hgb urine dipstick TRACE (*)    Protein, ur 30 (*)    All other components within normal limits  URINALYSIS, MICROSCOPIC (REFLEX) - Abnormal; Notable for the following components:   Bacteria, UA RARE (*)    All other components within normal limits  RESP PANEL BY RT-PCR (RSV, FLU A&B, COVID)  RVPGX2  LIPASE, BLOOD  SEDIMENTATION RATE  C-REACTIVE PROTEIN  MAGNESIUM    EKG None  Radiology CT ABDOMEN PELVIS W CONTRAST  Result Date: 05/07/2023 CLINICAL DATA:  Abdominal pain for 3 months with some nausea and constipation. EXAM: CT ABDOMEN AND PELVIS WITH CONTRAST TECHNIQUE: Multidetector CT imaging of the abdomen and pelvis was performed using the standard protocol following bolus administration of intravenous contrast. RADIATION DOSE REDUCTION: This exam was performed according to the departmental dose-optimization program which includes automated exposure control, adjustment of the mA and/or kV according to patient size and/or use of iterative reconstruction technique. CONTRAST:  OMNIPAQUE IOHEXOL 300 MG/ML  SOLN COMPARISON:  CT 03/07/2019.  FINDINGS: Lower chest: Linear opacity lung bases likely scar or atelectasis. No pleural effusion. There is a prominent node seen left of the descending thoracic aorta measuring 9 x 14 mm on series 301, image 3. Larger than usually seen. But stable going back to August 2020. Hepatobiliary: Fatty liver infiltration identified with some sparing along the gallbladder fossa margin. No separate space-occupying liver lesion. Patent portal vein. Gallbladder is nondilated. Pancreas: Unremarkable. No pancreatic ductal dilatation or surrounding inflammatory changes. Spleen: Normal in size without focal abnormality.  Small splenule. Adrenals/Urinary Tract: Adrenal glands are preserved. Duplicated left renal collecting system. No enhancing renal mass or collecting system dilatation. There are some tiny low-attenuation lesions present which are too small to completely characterize but have a nonaggressive appearance and would be consistent with probable benign cystic lesions, Bosniak 2. No specific imaging follow-up. Preserved contours of the urinary bladder. Stomach/Bowel: Stomach is within normal limits. Appendix appears normal. No evidence of bowel wall thickening, distention, or inflammatory changes. Vascular/Lymphatic: Aortic atherosclerosis. No enlarged abdominal or pelvic lymph nodes. Reproductive: Prostate is unremarkable. Other: No abdominal wall hernia or abnormality. No abdominopelvic ascites. Musculoskeletal: Mild curvature of the spine. Streak artifact related to the fixation hardware in the lower lumbar region. There is also some metallic foci along the course of the left iliac crest and extending into the adjacent tissues both deep and superficial. Please correlate for any prior injury or other process. IMPRESSION: No bowel obstruction, free air or free fluid.  Scattered stool. Fatty liver infiltration. Borderline enlarged lymph node in the posterior mediastinum adjacent to the descending thoracic aorta of  uncertain etiology and significance. However this has been stable going back to August 2020. no other clear lymph node enlargement elsewhere in the visualized abdomen and pelvis. Electronically Signed   By: Karen Kays M.D.   On: 05/07/2023 13:21   DG Chest Portable 1 View  Result Date: 05/07/2023 CLINICAL DATA:  Foot pain and swelling.  Congestion. EXAM: PORTABLE CHEST 1 VIEW COMPARISON:  Chest x-ray 12/06/2022. 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 Heart itself does not appear to be enlarged. No consolidation, pneumothorax or effusion. No edema. IMPRESSION: No acute cardiopulmonary disease. 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 Electronically Signed   By: Karen Kays M.D.   On: 05/07/2023 13:13   DG Foot Complete Left  Result Date: 05/07/2023 CLINICAL DATA:  Foot pain and swelling. EXAM: LEFT FOOT - COMPLETE 3 VIEW COMPARISON:  Foot x-ray 02/04/2023.  Images only.  No report. FINDINGS: Since the prior there has been removal of hardware along the first ray, first metatarsophalangeal joint and pins along the second and third metatarsal heads. There is some bony ankylosis across the first metatarsophalangeal joint. Previous ankylosis as well involving the proximal interphalangeal joint of second and third digits. No acute fracture or dislocation. Soft tissue swelling about the first ray. Mild osteopenia. IMPRESSION: Interval removal of previous hardware. Soft tissue swelling about the first ray. Ankylosis seen in several locations including the first tarsometatarsal joint, first metatarsophalangeal joint and across the proximal interphalangeal joints of the second and third digits. Please correlate with surgical history. Electronically Signed   By: Karen Kays M.D.   On: 05/07/2023 13:12    Procedures Procedures    Medications Ordered in ED Medications  fentaNYL (SUBLIMAZE) injection 50 mcg (50 mcg Intravenous Given 05/07/23 1106)   ipratropium-albuterol (DUONEB) 0.5-2.5 (3) MG/3ML nebulizer solution 3 mL (3 mLs Nebulization Given 05/07/23 1118)  methylPREDNISolone sodium succinate (SOLU-MEDROL) 125 mg/2 mL injection 125 mg (125 mg Intravenous Given 05/07/23 1107)  iohexol (OMNIPAQUE) 300 MG/ML solution 100 mL (100 mLs Intravenous Contrast Given 05/07/23 1222)  potassium chloride SA (KLOR-CON M) CR tablet 40 mEq (40 mEq Oral Given 05/07/23 1238)    ED Course/ Medical Decision Making/ A&P                                 Medical Decision Making Amount and/or Complexity of Data Reviewed Labs: ordered. Radiology: ordered.  Risk OTC drugs. Prescription drug management.    57 year old male with medical history significant for hypertension, depression, hyperlipidemia, asthma, GERD, anxiety who presents to the emergency department with multiple complaints.  The patient states that 1) he has had crampy abdominal discomfort and symptoms of constipation with associated nausea for the past 3 months.  He is passing gas.  He moves his bowels but he is not moving his bowels as regularly.  He tried a small amount of MiraLAX for this but not a full bowel regimen.  He has not tried anything else and has not followed up regarding this.  He denies any genitourinary symptoms.  He denies any vomiting and he is tolerating oral intake.  He also states that 2) he is having symptoms of nasal congestion, allergies and worsening shortness of breath which she thinks is triggering an asthma exacerbation.  He endorses worsening wheezing.  He has a nebulizer at home which has not been helping.  He denies any chest pain.  He additionally states that 3) he is having some localized swelling to the dorsum of his left foot.  He denies any falls or trauma.  He had a fusion of his metatarsal phalangeal joints and follows outpatient for this.  He denies any fever, chills, redness to the foot.   On arrival, the patient was afebrile, not tachycardic or  tachypneic, saturating 96% on room air, hemodynamically stable.  Physical exam as per above revealed evidence of mild expiratory wheezing bilaterally, no respiratory distress, evidence of mild soft tissue tenderness, no clear fluctuance, intact pulses, no erythema or warmth to suggest cellulitis of the foot.  Patient  has had a fusion of the foot and follows outpatient with podiatry.  Additionally, the patient had mild generalized abdominal tenderness to palpation, no rebound or guarding.  He is tolerating oral intake.  Differential diagnose includes ileus, drug-induced constipation, less likely small bowel obstruction or other acute intra-abdominal abnormality.  Lower concern for acute osteomyelitis of the foot.  So expect mild asthma exacerbation triggered by either viral upper respiratory infection or seasonal allergies.  Patient was administered IV Solu-Medrol in addition to DuoNebs, on repeat assessment he felt symptomatically improved from a breathing standpoint.  X-ray imaging of the chest did not reveal acute pulmonary abnormality, findings include: IMPRESSION:  No acute cardiopulmonary disease. 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          XR Left Foot: IMPRESSION:  Interval removal of previous hardware. Soft tissue swelling about  the first ray.    Ankylosis seen in several locations including the first  tarsometatarsal joint, first metatarsophalangeal joint and across  the proximal interphalangeal joints of the second and third digits.  Please correlate with surgical history.    CT Abd Pelvis: IMPRESSION:  No bowel obstruction, free air or free fluid.  Scattered stool.    Fatty liver infiltration.    Borderline enlarged lymph node in the posterior mediastinum adjacent  to the descending thoracic aorta of uncertain etiology and  significance. However this has been stable going back to August  2020. no other clear lymph node enlargement elsewhere in  the  visualized abdomen and pelvis.    Laboratory evaluation revealed ESR normal, lipase normal, COVID-19 influenza PCR testing negative, CBC without a leukocytosis or anemia, CMP with mild hypokalemia to 3.0, considered potassium shift in the setting of the patient's home nebulizer use, hyperglycemia to 179 without an anion gap acidosis, urinalysis with trace hemoglobin, negative for UTI.  Magnesium and CRP were collected and pending.  Patient's CT abdomen pelvis was overall reassuring without acute abnormality.  His chest x-ray and foot x-ray were reassuring.  Suspect likely mild asthma exacerbation, suspect likely constipation, consider drug-induced as the patient occasionally takes opiates at home.  Additionally, regarding the patient's foot, suspect mild osteoarthritis in the setting of the patient's history of surgical procedure to the foot, no clear evidence for osteomyelitis on x-ray imaging or laboratory evaluation, no leukocytosis no, notably fevers or chills, ESR normal, no evidence of overlying cellulitis of the foot.  Patient overall feeling symptomatically improved.  Will treat the patient's constipation with a course of Bentyl for abdominal spasm, advised a MiraLAX cleanout as per below.  DC Instructions: Your CT imaging was overall reassuring.  Your symptoms are consistent with likely abdominal cramping and discomfort in the setting of constipation.  Recommend you trial a MiraLAX bowel regimen at home, try 4 capfuls of MiraLAX in a 32 ounce Gatorade or similar beverage of similar size, drink over 4 hours, can repeat the following day for a satisfactory bowel movement.  Bentyl has been prescribed for crampy discomfort.  Minimize any opiate use as this can contribute to constipation, continue to orally rehydrate with increasing fluid intake.  Your potassium also could be artificially low in the setting of your home nebulizer use.  Your x-ray imaging of the foot was overall reassuring and you  have no evidence of infection on laboratory or imaging evaluation.  Your symptoms of asthma exacerbation could be triggered by other viral upper respiratory infection or seasonal allergies and improved after treatment in the ER.  Will  discharge on a course of prednisone.   Final Clinical Impression(s) / ED Diagnoses Final diagnoses:  Generalized abdominal pain  Constipation, unspecified constipation type  Hypokalemia  Exacerbation of asthma, unspecified asthma severity, unspecified whether persistent  Localized swelling of left foot    Rx / DC Orders ED Discharge Orders          Ordered    polyethylene glycol powder (GLYCOLAX/MIRALAX) 17 GM/SCOOP powder   Once        05/07/23 1328    dicyclomine (BENTYL) 20 MG tablet  2 times daily        05/07/23 1328    predniSONE (DELTASONE) 10 MG tablet  Daily        05/07/23 1332    potassium chloride SA (KLOR-CON M) 20 MEQ tablet  2 times daily        05/07/23 1333    magnesium oxide (MAGOX 400) 400 (240 Mg) MG tablet  Daily        05/07/23 1333              Ernie Avena, MD 05/07/23 1341

## 2023-05-07 NOTE — ED Triage Notes (Signed)
Pt presents pov with generalized abdominal X 3 months that worsened today. +nausea & constipation. Also reports nasal & chest congestion.

## 2023-06-20 ENCOUNTER — Ambulatory Visit (INDEPENDENT_AMBULATORY_CARE_PROVIDER_SITE_OTHER): Payer: Medicaid Other | Admitting: Podiatry

## 2023-06-20 ENCOUNTER — Ambulatory Visit (INDEPENDENT_AMBULATORY_CARE_PROVIDER_SITE_OTHER): Payer: Medicaid Other

## 2023-06-20 DIAGNOSIS — M778 Other enthesopathies, not elsewhere classified: Secondary | ICD-10-CM

## 2023-06-20 DIAGNOSIS — M2012 Hallux valgus (acquired), left foot: Secondary | ICD-10-CM | POA: Diagnosis not present

## 2023-06-20 DIAGNOSIS — M24575 Contracture, left foot: Secondary | ICD-10-CM

## 2023-06-20 MED ORDER — LIDOCAINE 5 % EX OINT: 1.0000 | TOPICAL_OINTMENT | CUTANEOUS | 3 refills | Status: DC | PRN

## 2023-06-20 MED ORDER — IBUPROFEN 800 MG PO TABS: 800.0000 mg | ORAL_TABLET | Freq: Three times a day (TID) | ORAL | 2 refills | Status: AC | PRN

## 2023-06-20 NOTE — Progress Notes (Signed)
  Subjective:  Patient ID: Caleb Hunter, male    DOB: Oct 07, 1965,  MRN: [id number]  Chief Complaint  Patient presents with   Routine Post Op    Pt presents for a post op states his foot is still still stiff and painful sometimes.    DOS: 02/22/2023 Procedure: Removal of left foot hardware  57 y.o. male returns for post-op check.  He returns for follow-up feels like his toes are still quite stiff Review of Systems: Negative except as noted in the HPI. Denies N/V/F/Ch.   Objective:  There were no vitals filed for this visit. There is no height or weight on file to calculate BMI. Constitutional Well developed. Well nourished.  Vascular Foot warm and well perfused. Capillary refill normal to all digits.  Calf is soft and supple, no posterior calf or knee pain, negative Homans' sign  Neurologic Normal speech. Oriented to person, place, and time. Epicritic sensation to light touch grossly present bilaterally.  Dermatologic Incisions are well-healed there is minimal hypertrophy  Orthopedic: Tenderness around lesser second and third MTPJ's with limited range of motion   Radiographs of the left foot taken today show well-healed osteotomies ghost holes from screw removal, now has complete fusion of the PIPJ fusion sites in the toes MTP joints of the second and third are maintained Assessment:   1. Hav (hallux abducto valgus), left   2. Capsulitis of left foot   3. Joint contracture of foot, left    Plan:  Patient was evaluated and treated and all questions answered.  S/p foot surgery left -Still having quite a bit of stiffness and sensitivity around the scar we discussed that a lot of the numbness may not fully ever go away, I did recommend going back to physical therapy for scar desensitization and manipulation and reduction and improving range of motion of the lesser digits.  Referral was placed.  We discussed topical treatment with lidocaine and ointment 5% was sent to pharmacy.  A  referral for PT was placed.  Refill of ibuprofen sent to pharmacy.  Given his continued pain and inflammation I recommended an MRI of the left foot to evaluate for the joint capsule and cartilage of the second and third MTP joints he will follow with me after the scan is done in 2 months.  Return in about 2 months (around 08/21/2023) for after MRI to review.

## 2023-06-26 ENCOUNTER — Ambulatory Visit
Admission: RE | Admit: 2023-06-26 | Discharge: 2023-06-26 | Disposition: A | Discharge status: Home / Self Care | Payer: Medicaid Other | Source: Ambulatory Visit | Attending: Podiatry

## 2023-06-26 DIAGNOSIS — M778 Other enthesopathies, not elsewhere classified: Secondary | ICD-10-CM

## 2023-07-17 ENCOUNTER — Ambulatory Visit: Payer: Medicaid Other | Admitting: Physical Therapy

## 2023-07-23 ENCOUNTER — Ambulatory Visit: Payer: Medicaid Other | Attending: Podiatry

## 2023-07-23 ENCOUNTER — Other Ambulatory Visit: Payer: Self-pay

## 2023-07-23 DIAGNOSIS — M24575 Contracture, left foot: Secondary | ICD-10-CM | POA: Insufficient documentation

## 2023-07-23 DIAGNOSIS — M778 Other enthesopathies, not elsewhere classified: Secondary | ICD-10-CM | POA: Diagnosis not present

## 2023-07-23 DIAGNOSIS — R208 Other disturbances of skin sensation: Secondary | ICD-10-CM | POA: Diagnosis present

## 2023-07-23 DIAGNOSIS — M25572 Pain in left ankle and joints of left foot: Secondary | ICD-10-CM | POA: Insufficient documentation

## 2023-07-23 DIAGNOSIS — M6281 Muscle weakness (generalized): Secondary | ICD-10-CM | POA: Insufficient documentation
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
## 2023-07-31 ENCOUNTER — Ambulatory Visit: Payer: Medicaid Other

## 2023-08-06 ENCOUNTER — Ambulatory Visit: Payer: Medicaid Other

## 2023-08-06 DIAGNOSIS — M25572 Pain in left ankle and joints of left foot: Secondary | ICD-10-CM | POA: Diagnosis not present

## 2023-08-06 DIAGNOSIS — M6281 Muscle weakness (generalized): Secondary | ICD-10-CM
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
## 2023-08-07 ENCOUNTER — Emergency Department (HOSPITAL_BASED_OUTPATIENT_CLINIC_OR_DEPARTMENT_OTHER)
Admission: EM | Admit: 2023-08-07 | Discharge: 2023-08-07 | Disposition: A | Discharge status: Home / Self Care | Payer: Medicaid Other | Attending: Emergency Medicine | Admitting: Emergency Medicine

## 2023-08-07 ENCOUNTER — Emergency Department (HOSPITAL_BASED_OUTPATIENT_CLINIC_OR_DEPARTMENT_OTHER): Payer: Medicaid Other

## 2023-08-07 ENCOUNTER — Encounter (HOSPITAL_BASED_OUTPATIENT_CLINIC_OR_DEPARTMENT_OTHER): Payer: Self-pay | Admitting: Emergency Medicine

## 2023-08-07 ENCOUNTER — Other Ambulatory Visit: Payer: Self-pay

## 2023-08-07 DIAGNOSIS — Z79899 Other long term (current) drug therapy: Secondary | ICD-10-CM | POA: Diagnosis not present

## 2023-08-07 DIAGNOSIS — M25572 Pain in left ankle and joints of left foot: Secondary | ICD-10-CM | POA: Diagnosis present

## 2023-08-07 DIAGNOSIS — G8918 Other acute postprocedural pain: Secondary | ICD-10-CM | POA: Insufficient documentation

## 2023-08-07 MED ORDER — OXYCODONE-ACETAMINOPHEN 5-325 MG PO TABS
1.0000 | ORAL_TABLET | Freq: Once | ORAL | Status: AC
  Administered 2023-08-07: 1 via ORAL
  Filled 2023-08-07: qty 1

## 2023-08-07 MED ORDER — OXYCODONE-ACETAMINOPHEN 5-325 MG PO TABS: 1.0000 | ORAL_TABLET | Freq: Four times a day (QID) | ORAL | 0 refills | Status: DC | PRN

## 2023-08-07 NOTE — ED Triage Notes (Signed)
C/o left ankle and foot pain. Recently had surgery on ankle, had PT appt yesterday and now severe pain to area. Denies any new injuries.

## 2023-08-07 NOTE — ED Notes (Signed)

## 2023-08-07 NOTE — Discharge Instructions (Signed)
You were seen in the ER today for concerns of ankle pain. Your imaging is all reassuring. I suspect this pain is aggravated from your recent physical therapy session. I believe it would be beneficial to continue on with your physical therapy, but make sure you elevate and ice the ankle after your sessions. Please discuss with your podiatrist for further pain control. For any new or worsening symptoms, return to the ER.

## 2023-08-07 NOTE — ED Provider Notes (Signed)
[place] [facility]MENT AT [facility] Provider Note   CSN: [id number] Arrival date & time: 08/07/23  4098     History Chief Complaint  Patient presents with   Ankle Pain    Caleb Hunter is a 58 y.o. male.  Patient with past history significant for hallux valgus presents to the emergency department concerns of foot and ankle pain.  Patient recently had removal of surgical hardware from his left ankle.  Follows with Dr. Lilian Kapur with [facility].  States that he was at physical therapy yesterday and was able to manage the session well but states after the therapy session, he had worsening pain in his left foot.  This is his second session.  Has not followed up recently with Dr. Lilian Kapur.  Denies any tingling, numbness, or pulselessness in the left foot.   Ankle Pain      Home Medications Prior to Admission medications   Medication Sig Start Date End Date Taking? Authorizing Provider  oxyCODONE-acetaminophen (PERCOCET/ROXICET) 5-325 MG tablet Take 1 tablet by mouth every 6 (six) hours as needed for severe pain (pain score 7-10). 08/07/23  Yes Maryanna Shape A, PA-C  albuterol (PROVENTIL) (2.5 MG/3ML) 0.083% nebulizer solution Take 2.5 mg by nebulization every 6 (six) hours as needed for wheezing or shortness of breath.    [provider]  albuterol (VENTOLIN HFA) 108 (90 Base) MCG/ACT inhaler Inhale 2 puffs into the lungs every 4 (four) hours as needed for shortness of breath or wheezing.    [provider]  allopurinol (ZYLOPRIM) 300 MG tablet Take 300 mg by mouth daily as needed (gout). 12/14/20   [provider]  amLODipine (NORVASC) 10 MG tablet Take 10 mg by mouth daily. 10/21/17   [provider]  atenolol-chlorthalidone (TENORETIC) 50-25 MG tablet Take 1 tablet by mouth daily.    [provider]  atorvastatin (LIPITOR) 80 MG tablet Take 40 mg by mouth at bedtime.    [provider]   budesonide-formoterol (SYMBICORT) 80-4.5 MCG/ACT inhaler Inhale 2 puffs into the lungs 2 (two) times daily. 07/27/20   [provider]  clobetasol cream (TEMOVATE) 0.05 % Apply 1 application topically 2 (two) times daily as needed (irritation).    [provider]  Colchicine 0.6 MG CAPS Take 1 tablet by mouth daily as needed (gout). Patient not taking: Reported on 07/23/2023    [provider]  dicyclomine (BENTYL) 20 MG tablet Take 1 tablet (20 mg total) by mouth 2 (two) times daily. 05/07/23   Ernie Avena, MD  EPINEPHrine 0.3 mg/0.3 mL IJ SOAJ injection Inject 0.3 mg into the muscle as needed for anaphylaxis. 04/26/20   [provider]  gabapentin (NEURONTIN) 300 MG capsule Take 1 capsule (300 mg total) by mouth 3 (three) times daily for 7 days. 03/02/22 03/09/22  McDonald, Rachelle Hora, DPM  ibuprofen (ADVIL) 800 MG tablet Take 1 tablet (800 mg total) by mouth every 8 (eight) hours as needed. Patient not taking: Reported on 07/23/2023 06/20/23 09/18/23  Edwin Cap, DPM  lidocaine (XYLOCAINE) 5 % ointment Apply 1 Application topically as needed. 06/20/23   McDonald, Rachelle Hora, DPM  montelukast (SINGULAIR) 10 MG tablet Take 10 mg by mouth at bedtime. 06/07/15   [provider]  Olopatadine HCl 0.2 % SOLN Place 1 drop into both eyes daily as needed (eye allergies). 12/25/19   [provider]  omeprazole (PRILOSEC) 20 MG capsule Take 20 mg by mouth daily. 08/01/20   [provider]  potassium chloride SA (KLOR-CON M) 20 MEQ tablet Take 2 tablets (40 mEq total) by mouth 2 (two) times daily for 3 days. 05/07/23 05/10/23  Ernie Avena, MD      Allergies    Lisinopril and Shellfish allergy    Review of Systems   Review of Systems  Musculoskeletal:        Foot pain  All other systems reviewed and are negative.   Physical Exam Updated Vital Signs BP (!) 151/78 (BP Location: Left Arm)   Pulse 71   Temp (!) 97.1 F (36.2 C) (Temporal)    Resp 18   Ht 6\' 4"  (1.93 m)   Wt 123.4 kg   SpO2 99%   BMI 33.11 kg/m  Physical Exam Vitals and nursing note reviewed.  Constitutional:      General: He is not in acute distress.    Appearance: He is well-developed.  HENT:     Head: Normocephalic and atraumatic.  Eyes:     Conjunctiva/sclera: Conjunctivae normal.  Cardiovascular:     Rate and Rhythm: Normal rate and regular rhythm.     Pulses:          Dorsalis pedis pulses are 2+ on the right side and 2+ on the left side.       Posterior tibial pulses are 2+ on the right side and 2+ on the left side.     Heart sounds: No murmur heard. Pulmonary:     Effort: Pulmonary effort is normal. No respiratory distress.     Breath sounds: Normal breath sounds.  Abdominal:     Palpations: Abdomen is soft.     Tenderness: There is no abdominal tenderness.  Musculoskeletal:        General: No swelling.     Cervical back: Neck supple.  Skin:    General: Skin is warm and dry.     Capillary Refill: Capillary refill takes less than 2 seconds.  Neurological:     Mental Status: He is alert.  Psychiatric:        Mood and Affect: Mood normal.     ED Results / Procedures / Treatments   Labs (all labs ordered are listed, but only abnormal results are displayed) Labs Reviewed - No data to display  EKG None  Radiology DG Foot Complete Left Result Date: 08/07/2023 CLINICAL DATA:  Left foot pain EXAM: LEFT FOOT - COMPLETE 3+ VIEW COMPARISON:  06/27/2023 FINDINGS: Postsurgical changes and fusion noted 1st tarsal metatarsal region and metatarsal-phalangeal region. No acute fracture, subluxation or dislocation. No bone destruction to suggest osteomyelitis. Soft tissues are intact. IMPRESSION: Stable postsurgical changes.  No acute bony abnormality. Electronically Signed   By: Charlett Nose M.D.   On: 08/07/2023 11:02   DG Ankle Complete Left Result Date: 08/07/2023 CLINICAL DATA:  Left ankle and foot pain EXAM: LEFT ANKLE COMPLETE - 3+ VIEW  COMPARISON:  06/20/2023. Multiple remote studies dating back to 11/28/2021. FINDINGS: Rounded lucency in the calcaneus is stable dating back to remote images from 11/28/2021 compatible with a benign process. No acute fracture, subluxation or dislocation. Joint spaces maintained. Soft tissues are intact. IMPRESSION: No acute bony abnormality. Electronically Signed   By: Charlett Nose M.D.   On: 08/07/2023 11:01    Procedures Procedures    Medications Ordered in ED Medications  oxyCODONE-acetaminophen (PERCOCET/ROXICET) 5-325 MG per tablet 1 tablet (has no administration in time range)    ED Course/ Medical Decision Making/ A&P  Medical Decision Making Amount and/or Complexity of Data Reviewed Radiology: ordered.  Risk Prescription drug management.   This patient presents to the ED for concern of ankle pain.  Differential diagnosis includes ankle dislocation, ankle fracture, joint effusion, septic joint   Imaging Studies ordered:  I ordered imaging studies including x-ray of the left foot, left ankle I independently visualized and interpreted imaging which showed postsurgical changes with no obvious signs of any acute injury I agree with the radiologist interpretation   Medicines ordered and prescription drug management:  I ordered medication including Percocet for pain Reevaluation of the patient after these medicines showed that the patient improved I have reviewed the patients home medicines and have made adjustments as needed   Problem List / ED Course:  Patient with past history significant for hallux valgus of the left foot presents the emergency department concerns of ankle pain.  States that this pain has been worsening since his physical therapy session yesterday.  States that physical therapy currently has him trying to strengthen the left ankle following his surgery.  This was his second PT session. Denies severe pain after session,  but pain progressively worsening since then. Exam is unremarkable with mild swelling post-operative findings of healing incision site on dorsum of the left foot but no significant edema or erythema concerning for infection. Movement in toes intact. Neurovascularly intact. I suspect patient's pain is primarily from his PT session and not indicating any specific trauma or injury. Given difficulty with ambulation, will provide a CAM boot and advised patient follow up with podiatry for repeat evaluation. Encouraged patient to return to PT sessions and ensure he elevate his leg and applies ice afterwards to reduce the swelling. A small prescription for Percocet sent for further pain control but advised patient that he needs to be seen by podiatry for further medication refills if needed. Discussed return precautions. Discharged home in stable condition.  Final Clinical Impression(s) / ED Diagnoses Final diagnoses:  Post-operative pain  Acute left ankle pain    Rx / DC Orders ED Discharge Orders          Ordered    oxyCODONE-acetaminophen (PERCOCET/ROXICET) 5-325 MG tablet  Every 6 hours PRN        08/07/23 1221              Smitty Knudsen, PA-C 08/07/23 1233    Terrilee Files, MD 08/07/23 1737

## 2023-08-08 ENCOUNTER — Ambulatory Visit: Payer: Medicaid Other

## 2023-08-13 ENCOUNTER — Ambulatory Visit: Payer: Medicaid Other

## 2023-08-15 ENCOUNTER — Ambulatory Visit: Payer: Medicaid Other | Admitting: Physical Therapy

## 2023-08-22 ENCOUNTER — Ambulatory Visit (INDEPENDENT_AMBULATORY_CARE_PROVIDER_SITE_OTHER): Payer: Medicaid Other | Admitting: Podiatry

## 2023-08-22 ENCOUNTER — Encounter: Payer: Self-pay | Admitting: Podiatry

## 2023-08-22 VITALS — Ht 76.0 in | Wt 272.0 lb

## 2023-08-22 DIAGNOSIS — M778 Other enthesopathies, not elsewhere classified: Secondary | ICD-10-CM

## 2023-08-22 DIAGNOSIS — M24575 Contracture, left foot: Secondary | ICD-10-CM

## 2023-08-22 DIAGNOSIS — M19272 Secondary osteoarthritis, left ankle and foot: Secondary | ICD-10-CM

## 2023-08-22 DIAGNOSIS — M1990 Unspecified osteoarthritis, unspecified site: Secondary | ICD-10-CM

## 2023-08-22 NOTE — Progress Notes (Signed)
Subjective:  Patient ID: Caleb Hunter, male    DOB: Sep 21, 1965,  MRN: [id number]  Chief Complaint  Patient presents with   Routine Post Op    Pt is here to discuss MRI results that were previously done    DOS: 02/22/2023 Procedure: Removal of left foot hardware  58 y.o. male returns for post-op check.  He returns for follow-up feels like his toes are still quite stiff  Review of Systems: Negative except as noted in the HPI. Denies N/V/F/Ch.   Objective:  There were no vitals filed for this visit. Body mass index is 33.11 kg/m. Constitutional Well developed. Well nourished.  Vascular Foot warm and well perfused. Capillary refill normal to all digits.  Calf is soft and supple, no posterior calf or knee pain, negative Homans' sign  Neurologic Normal speech. Oriented to person, place, and time. Epicritic sensation to light touch grossly present bilaterally.  Dermatologic Incisions are well-healed there is minimal hypertrophy  Orthopedic: Tenderness around lesser second and third MTPJ's with limited range of motion   Radiographs of the left foot taken today show well-healed osteotomies ghost holes from screw removal, now has complete fusion of the PIPJ fusion sites in the toes MTP joints of the second and third are maintained  Study Result  Narrative & Impression  CLINICAL DATA:  Chronic foot pain.   EXAM: MRI OF THE LEFT TOES WITHOUT CONTRAST   TECHNIQUE: Multiplanar, multisequence MR imaging of the left foot was performed. No intravenous contrast was administered.   COMPARISON:  Radiographs 06/20/2023   FINDINGS: Solid-appearing fusion at the first TMT joint and at the first MTP joint. No obvious complicating features are identified. There are moderate degenerative changes at the interphalangeal joint of the great toe. I there appears to be fusion across the PIP joints of the second and third toes.   Mild to moderate degenerative changes involving the second and  third MTP joints. No erosive changes. Widened space between the first and second metatarsal heads may be surgery related. The midfoot joint spaces are fairly well maintained. No stress fracture involving the metatarsals.   The major tendons and ligaments appear intact. Mild myositis involving the foot musculature along with mild fatty atrophy.   IMPRESSION: 1. Solid-appearing fusion at the first TMT joint and at the first MTP joint. No obvious complicating features are identified. 2. Moderate degenerative changes at the interphalangeal joint of the great toe. 3. Mild to moderate degenerative changes involving the second and third MTP joints. 4. Widened space between the first and second metatarsal heads may be surgery related. 5. The major tendons and ligaments appear intact. 6. Mild myositis involving the foot musculature along with mild fatty atrophy.     Electronically Signed   By: Rudie Meyer M.D.   On: 06/26/2023 11:32     Assessment:   1. Inflammatory arthritis   2. Capsulitis of left foot   3. Joint contracture of foot, left   4. Other secondary osteoarthritis of left foot    Plan:  Patient was evaluated and treated and all questions answered.  S/p foot surgery left -Reviewed his radiographs.  He began PT but had to stop due to increasing pain in the foot and also pain and swelling in the ankle.  He does not recall if he ever has a history of gout.  I recommend evaluating for this as well as other inflammatory Tritus due to the inflammatory nature and chronic pain of his chronic foot and ankle issues.  Lab work has been ordered I will let him know the results of the show.  I do think he should continue therapy to work on desensitization and scar manipulation and joint range of motion and mobility of the lesser toes.  Referral placed again to restart therapy.  Follow-up with me in 3 months to reevaluate  Return in about 3 months (around 11/19/2023) for follow up after  PT.
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
## 2023-09-03 ENCOUNTER — Ambulatory Visit: Payer: Medicaid Other | Admitting: Physical Therapy

## 2023-09-12 ENCOUNTER — Ambulatory Visit: Payer: Medicaid Other | Admitting: Physical Therapy

## 2023-11-15 ENCOUNTER — Other Ambulatory Visit: Payer: Self-pay | Admitting: Orthopedic Surgery

## 2023-11-15 DIAGNOSIS — M545 Low back pain, unspecified: Secondary | ICD-10-CM

## 2023-11-15 DIAGNOSIS — M542 Cervicalgia: Secondary | ICD-10-CM

## 2023-11-19 ENCOUNTER — Ambulatory Visit (INDEPENDENT_AMBULATORY_CARE_PROVIDER_SITE_OTHER): Payer: Medicaid Other | Admitting: Podiatry

## 2023-11-19 ENCOUNTER — Encounter: Payer: Self-pay | Admitting: Podiatry

## 2023-11-19 VITALS — Ht 76.0 in | Wt 272.0 lb

## 2023-11-19 DIAGNOSIS — G5772 Causalgia of left lower limb: Secondary | ICD-10-CM | POA: Diagnosis not present

## 2023-11-19 MED ORDER — LIDOCAINE 5 % EX OINT
1.0000 | TOPICAL_OINTMENT | CUTANEOUS | 3 refills | Status: DC | PRN
Start: 2023-11-19 — End: 2024-02-13

## 2023-11-19 MED ORDER — IBUPROFEN 800 MG PO TABS: 800.0000 mg | ORAL_TABLET | Freq: Three times a day (TID) | ORAL | 2 refills | Status: AC | PRN

## 2023-11-19 NOTE — Progress Notes (Signed)
 Subjective:  Patient ID: Caleb Hunter, male    DOB: Dec 20, 1965,  MRN: [id number]  Chief Complaint  Patient presents with   Foot Pain    Patient is here for F/U after PT for arthritis, and left foot capsulitis patient states foot is doing worse since starting pt.    DOS: 02/22/2023 Procedure: Removal of left foot hardware  58 y.o. male returns for post-op check.  He returns for follow-up without much change, he said he did a couple sessions of PT and it made his calf feel worse and that he did not go back.  He did not complete the lab work he went but they were close renovation I did not go back  Review of Systems: Negative except as noted in the HPI. Denies N/V/F/Ch.   Objective:  There were no vitals filed for this visit. Body mass index is 33.11 kg/m. Constitutional Well developed. Well nourished.  Vascular Foot warm and well perfused. Capillary refill normal to all digits.  Calf is soft and supple, no posterior calf or knee pain, negative Homans' sign  Neurologic Normal speech. Oriented to person, place, and time. Epicritic sensation to light touch grossly present bilaterally.  Dermatologic Incisions are well-healed there is minimal hypertrophy  Orthopedic: Minimal edema today.  Limited range of motion of first second third MCP   Radiographs of the left foot taken previously show well-healed osteotomies ghost holes from screw removal, now has complete fusion of the PIPJ fusion sites in the toes MTP joints of the second and third are maintained  Study Result  Narrative & Impression  CLINICAL DATA:  Chronic foot pain.   EXAM: MRI OF THE LEFT TOES WITHOUT CONTRAST   TECHNIQUE: Multiplanar, multisequence MR imaging of the left foot was performed. No intravenous contrast was administered.   COMPARISON:  Radiographs 06/20/2023   FINDINGS: Solid-appearing fusion at the first TMT joint and at the first MTP joint. No obvious complicating features are identified. There  are moderate degenerative changes at the interphalangeal joint of the great toe. I there appears to be fusion across the PIP joints of the second and third toes.   Mild to moderate degenerative changes involving the second and third MTP joints. No erosive changes. Widened space between the first and second metatarsal heads may be surgery related. The midfoot joint spaces are fairly well maintained. No stress fracture involving the metatarsals.   The major tendons and ligaments appear intact. Mild myositis involving the foot musculature along with mild fatty atrophy.   IMPRESSION: 1. Solid-appearing fusion at the first TMT joint and at the first MTP joint. No obvious complicating features are identified. 2. Moderate degenerative changes at the interphalangeal joint of the great toe. 3. Mild to moderate degenerative changes involving the second and third MTP joints. 4. Widened space between the first and second metatarsal heads may be surgery related. 5. The major tendons and ligaments appear intact. 6. Mild myositis involving the foot musculature along with mild fatty atrophy.     Electronically Signed   By: Marrian Siva M.D.   On: 06/26/2023 11:32     Assessment:   1. Complex regional pain syndrome type 2 of left lower extremity     Plan:  Patient was evaluated and treated and all questions answered.  S/p foot surgery left I discussed with him that at this point I think likely his gain in his feet from a surgical and procedural standpoint is maximized.  There is not much more I can offer  in terms of procedures or surgeries that I would recommend at this point, injections have given minimal gain.  I refilled his ibuprofen  and lidocaine  ointment.  I discussed with him that his chronic foot pain will likely have to be part of his pain management strategy that he should discuss this with his pain management specialist.  He does have Upcoming workup with his back specialist as  well and this may contribute to some neuritic pain.  I recommended that he complete his lab work and I gave him the requisition sheets for this as well and I will let him know if there is anything to pursue here from a treatment standpoint.  No follow-ups on file.

## 2023-11-28 LAB — SEDIMENTATION RATE: Sed Rate: 102 mm/h — ABNORMAL HIGH (ref 0–30)

## 2023-11-28 LAB — LUPUS ANTICOAGULANT
Dilute Viper Venom Time: 35.9 s (ref 0.0–47.0)
PTT Lupus Anticoagulant: 35.4 s (ref 0.0–43.5)
Thrombin Time: 20.3 s (ref 0.0–23.0)
dPT Confirm Ratio: 1.09 ratio (ref 0.00–1.34)
dPT: 35.5 s (ref 0.0–47.6)

## 2023-11-28 LAB — ANTI-CCP AB, IGG + IGA (RDL): Anti-CCP Ab, IgG + IgA (RDL): <20 U (ref <20)

## 2023-11-28 LAB — ANA: Anti Nuclear Antibody (ANA): NEGATIVE

## 2023-11-28 LAB — URIC ACID: Uric Acid: 12.5 mg/dL — ABNORMAL HIGH (ref 3.8–8.4)

## 2023-11-28 LAB — RHEUMATOID FACTOR: Rheumatoid fact SerPl-aCnc: 16.9 [IU]/mL — ABNORMAL HIGH (ref <14.0)

## 2023-11-28 LAB — HLA-B27 ANTIGEN: HLA B27: NEGATIVE

## 2023-11-29 ENCOUNTER — Other Ambulatory Visit: Payer: Self-pay | Admitting: Podiatry

## 2023-11-29 ENCOUNTER — Ambulatory Visit: Payer: Self-pay | Admitting: Podiatry

## 2023-11-29 DIAGNOSIS — M0580 Other rheumatoid arthritis with rheumatoid factor of unspecified site: Secondary | ICD-10-CM

## 2023-11-29 MED ORDER — COLCHICINE 0.6 MG PO TABS: 0.6000 mg | ORAL_TABLET | Freq: Every day | ORAL | 0 refills | Status: DC

## 2023-12-14 ENCOUNTER — Inpatient Hospital Stay: Admission: RE | Admit: 2023-12-14 | Source: Ambulatory Visit

## 2023-12-14 ENCOUNTER — Other Ambulatory Visit

## 2023-12-31 ENCOUNTER — Ambulatory Visit
Admission: RE | Admit: 2023-12-31 | Discharge: 2023-12-31 | Disposition: A | Discharge status: Home / Self Care | Source: Ambulatory Visit | Attending: Orthopedic Surgery | Admitting: Orthopedic Surgery

## 2023-12-31 DIAGNOSIS — M542 Cervicalgia: Secondary | ICD-10-CM

## 2023-12-31 DIAGNOSIS — M545 Low back pain, unspecified: Secondary | ICD-10-CM

## 2024-01-27 ENCOUNTER — Other Ambulatory Visit: Payer: Self-pay

## 2024-01-27 ENCOUNTER — Emergency Department (HOSPITAL_BASED_OUTPATIENT_CLINIC_OR_DEPARTMENT_OTHER)

## 2024-01-27 ENCOUNTER — Emergency Department (HOSPITAL_BASED_OUTPATIENT_CLINIC_OR_DEPARTMENT_OTHER)
Admission: EM | Admit: 2024-01-27 | Discharge: 2024-01-27 | Disposition: A | Discharge status: Home / Self Care | Attending: Emergency Medicine | Admitting: Emergency Medicine

## 2024-01-27 ENCOUNTER — Encounter (HOSPITAL_BASED_OUTPATIENT_CLINIC_OR_DEPARTMENT_OTHER): Payer: Self-pay

## 2024-01-27 DIAGNOSIS — M25531 Pain in right wrist: Secondary | ICD-10-CM | POA: Diagnosis present

## 2024-01-27 DIAGNOSIS — M25511 Pain in right shoulder: Secondary | ICD-10-CM | POA: Diagnosis not present

## 2024-01-27 MED ORDER — BENZONATATE 100 MG PO CAPS
200.0000 mg | ORAL_CAPSULE | Freq: Once | ORAL | Status: AC
  Administered 2024-01-27: 200 mg via ORAL
  Filled 2024-01-27: qty 2

## 2024-01-27 MED ORDER — MELOXICAM 7.5 MG PO TABS: 7.5000 mg | ORAL_TABLET | Freq: Every day | ORAL | 0 refills | Status: DC

## 2024-01-27 MED ORDER — KETOROLAC TROMETHAMINE 15 MG/ML IJ SOLN
15.0000 mg | Freq: Once | INTRAMUSCULAR | Status: AC
  Administered 2024-01-27: 15 mg via INTRAMUSCULAR
  Filled 2024-01-27: qty 1

## 2024-01-27 NOTE — ED Triage Notes (Signed)
 R arm swelling that started last night but is worse today. Endorses 10/10 pain. Denies injury

## 2024-01-27 NOTE — Discharge Instructions (Signed)
 Follow up with hand surgery in the office.  Follow up with your family doc.   Take the meloxicam  as prescribed. Also take tylenol  1000mg (2 extra strength) four times a day.

## 2024-01-27 NOTE — ED Notes (Signed)
 Awaiting provider approval of splint before d/c.

## 2024-01-27 NOTE — ED Provider Notes (Signed)
 [place] [facility] AT [facility] Provider Note   CSN: [id number] Arrival date & time: 01/27/24  9183     Patient presents with: Arm Swelling   Caleb Hunter is a 58 y.o. male.   58 yo M with a chief complaints of right wrist and shoulder pain.  He had pain to the shoulder off and on for months.  Seems to come and go.  No obvious injury.  Pain in the wrist been going on for about 3 to 4 days.  Worse with movement and palpation twisting.  No fevers.        Prior to Admission medications   Medication Sig Start Date End Date Taking? Authorizing Provider  meloxicam  (MOBIC ) 7.5 MG tablet Take 1 tablet (7.5 mg total) by mouth daily. 01/27/24  Yes Emil Share, DO  albuterol  (PROVENTIL ) (2.5 MG/3ML) 0.083% nebulizer solution Take 2.5 mg by nebulization every 6 (six) hours as needed for wheezing or shortness of breath.    [provider]  albuterol  (VENTOLIN  HFA) 108 (90 Base) MCG/ACT inhaler Inhale 2 puffs into the lungs every 4 (four) hours as needed for shortness of breath or wheezing.    [provider]  allopurinol  (ZYLOPRIM ) 300 MG tablet Take 300 mg by mouth daily as needed (gout). 12/14/20   [provider]  amLODipine  (NORVASC ) 10 MG tablet Take 10 mg by mouth daily. 10/21/17   [provider]  atenolol -chlorthalidone  (TENORETIC ) 50-25 MG tablet Take 1 tablet by mouth daily.    [provider]  atorvastatin  (LIPITOR) 80 MG tablet Take 40 mg by mouth at bedtime.    [provider]  budesonide -formoterol  (SYMBICORT ) 80-4.5 MCG/ACT inhaler Inhale 2 puffs into the lungs 2 (two) times daily. 07/27/20   [provider]  clobetasol  cream (TEMOVATE ) 0.05 % Apply 1 application topically 2 (two) times daily as needed (irritation).    [provider]  Colchicine  0.6 MG CAPS Take 1 tablet by mouth daily as needed (gout).    [provider]  colchicine  0.6 MG tablet Take 1 tablet (0.6 mg total) by  mouth daily. 11/29/23   Gershon Donnice SAUNDERS, DPM  dicyclomine  (BENTYL ) 20 MG tablet Take 1 tablet (20 mg total) by mouth 2 (two) times daily. 05/07/23   Jerrol Agent, MD  EPINEPHrine  0.3 mg/0.3 mL IJ SOAJ injection Inject 0.3 mg into the muscle as needed for anaphylaxis. 04/26/20   [provider]  gabapentin  (NEURONTIN ) 300 MG capsule Take 1 capsule (300 mg total) by mouth 3 (three) times daily for 7 days. 03/02/22 03/09/22  McDonald, Juliene SAUNDERS, DPM  lidocaine  (XYLOCAINE ) 5 % ointment Apply 1 Application topically as needed. 11/19/23   McDonald, Juliene SAUNDERS, DPM  montelukast  (SINGULAIR ) 10 MG tablet Take 10 mg by mouth at bedtime. 06/07/15   [provider]  Olopatadine  HCl 0.2 % SOLN Place 1 drop into both eyes daily as needed (eye allergies). 12/25/19   [provider]  omeprazole  (PRILOSEC) 20 MG capsule Take 20 mg by mouth daily. 08/01/20   [provider]  oxyCODONE -acetaminophen  (PERCOCET/ROXICET) 5-325 MG tablet Take 1 tablet by mouth every 6 (six) hours as needed for severe pain (pain score 7-10). 08/07/23   Zelaya, Oscar A, PA-C  potassium chloride  SA (KLOR-CON  M) 20 MEQ tablet Take 2 tablets (40 mEq total) by mouth 2 (two) times daily for 3 days. 05/07/23 05/10/23  Jerrol Agent, MD    Allergies: Lisinopril  and Shellfish allergy    Review of Systems  Updated Vital Signs BP (!) 155/94 (BP Location: Left Arm)   Pulse 66   Temp 97.9 F (36.6 C) (Oral)   Resp 16   Ht 6' 4 (1.93 m)   Wt 121.1 kg   SpO2 96%   BMI 32.50 kg/m   Physical Exam Vitals and nursing note reviewed.  Constitutional:      Appearance: He is well-developed.  HENT:     Head: Normocephalic and atraumatic.  Eyes:     Pupils: Pupils are equal, round, and reactive to light.  Neck:     Vascular: No JVD.  Cardiovascular:     Rate and Rhythm: Normal rate and regular rhythm.     Heart sounds: No murmur heard.    No friction rub. No gallop.  Pulmonary:     Effort: No respiratory  distress.     Breath sounds: No wheezing.  Abdominal:     General: There is no distension.     Tenderness: There is no abdominal tenderness. There is no guarding or rebound.  Musculoskeletal:        General: Swelling and tenderness present. Normal range of motion.     Cervical back: Normal range of motion and neck supple.     Comments: No obvious bony tenderness on palpation of the right shoulder.  No pain along the clavicle or the [facility] joint or the scapula.  Pain with range of motion of the shoulder in all directions.  Limitation due to pain.  Motor and sensation intact to the hand.  Patient with some mild edema to the wrist.  No obvious erythema.  I am able to range the wrist with some discomfort.  Skin:    Coloration: Skin is not pale.     Findings: No rash.  Neurological:     Mental Status: He is alert and oriented to person, place, and time.  Psychiatric:        Behavior: Behavior normal.     (all labs ordered are listed, but only abnormal results are displayed) Labs Reviewed - No data to display  EKG: None  Radiology: DG Wrist Complete Right Result Date: 01/27/2024 CLINICAL DATA:  Wrist swelling. EXAM: RIGHT WRIST - COMPLETE 3+ VIEW COMPARISON:  None Available. FINDINGS: Os ossific fragment posterior to the carpus is compatible with an age-indeterminate triquetral fracture. No other acute fracture evident. No dislocation. No worrisome lytic or sclerotic osseous abnormality. IMPRESSION: Age-indeterminate triquetral fracture. MRI may prove helpful to evaluate for marrow edema as would be expected in the setting of acute trauma. Electronically Signed   By: Camellia Candle M.D.   On: 01/27/2024 09:14   DG Shoulder Right Result Date: 01/27/2024 CLINICAL DATA:  Increasing right shoulder pain status post nerve conduction study EXAM: RIGHT SHOULDER - 3 VIEW COMPARISON:  None Available. FINDINGS: There is no evidence of fracture or dislocation. There is no evidence of arthropathy or other  focal bone abnormality. Os acromiale. Soft tissues are unremarkable. IMPRESSION: No acute fracture or dislocation. Electronically Signed   By: Limin  Xu M.D.   On: 01/27/2024 09:14     Procedures   Medications Ordered in the ED  benzonatate  (TESSALON ) capsule 200 mg (200 mg Oral Given 01/27/24 0847)  ketorolac  (TORADOL ) 15 MG/ML injection 15 mg (15 mg Intramuscular Given 01/27/24 0847)                                    Medical  Decision Making Amount and/or Complexity of Data Reviewed Radiology: ordered.  Risk Prescription drug management.   58 yo M with a chief complaint of right wrist and shoulder pain.  Shoulder pain has been going on for some time off and on but the wrist is more acute going on for about 4 days or so.  Does not clinically have septic arthritis on exam.  Will obtain plain film of the wrist and shoulder.  On my record review the patient has a history of complex regional pain syndrome.  I been chronically on NSAIDs but he tells me recently taken off.   Likely will restart NSAIDs for him.  Have him follow-up with his PCP.  With recurrent shoulder pain will give follow-up with orthopedics.  Plain film of the right shoulder independently interpreted by me without fx.  Plain film of the right wrist independently interpreted by me without obvious fracture.  Radiology read with possible age-indeterminate triquetral fracture.  Patient again endorses no recent injury.  Will splint.  Hand follow-up.  9:42 AM:  I have discussed the diagnosis/risks/treatment options with the patient.  Evaluation and diagnostic testing in the emergency department does not suggest an emergent condition requiring admission or immediate intervention beyond what has been performed at this time.  They will follow up with PCP. We also discussed returning to the ED immediately if new or worsening sx occur. We discussed the sx which are most concerning (e.g., sudden worsening pain, fever, inability to  tolerate by mouth) that necessitate immediate return. Medications administered to the patient during their visit and any new prescriptions provided to the patient are listed below.  Medications given during this visit Medications  benzonatate  (TESSALON ) capsule 200 mg (200 mg Oral Given 01/27/24 0847)  ketorolac  (TORADOL ) 15 MG/ML injection 15 mg (15 mg Intramuscular Given 01/27/24 0847)     The patient appears reasonably screen and/or stabilized for discharge and I doubt any other medical condition or other [facility] requiring further screening, evaluation, or treatment in the ED at this time prior to discharge.        Final diagnoses:  Right wrist pain  Acute pain of right shoulder    ED Discharge Orders          Ordered    meloxicam  (MOBIC ) 7.5 MG tablet  Daily        01/27/24 0935               Emil Share, DO 01/27/24 [phone]

## 2024-02-13 ENCOUNTER — Ambulatory Visit: Admitting: Podiatry

## 2024-02-13 ENCOUNTER — Ambulatory Visit (INDEPENDENT_AMBULATORY_CARE_PROVIDER_SITE_OTHER)

## 2024-02-13 VITALS — Ht 76.0 in | Wt 267.0 lb

## 2024-02-13 DIAGNOSIS — M05772 Rheumatoid arthritis with rheumatoid factor of left ankle and foot without organ or systems involvement: Secondary | ICD-10-CM | POA: Diagnosis not present

## 2024-02-13 DIAGNOSIS — M1A4720 Other secondary chronic gout, left ankle and foot, without tophus (tophi): Secondary | ICD-10-CM | POA: Diagnosis not present

## 2024-02-13 DIAGNOSIS — M7752 Other enthesopathy of left foot: Secondary | ICD-10-CM | POA: Diagnosis not present

## 2024-02-13 DIAGNOSIS — M778 Other enthesopathies, not elsewhere classified: Secondary | ICD-10-CM

## 2024-02-13 MED ORDER — IBUPROFEN 800 MG PO TABS: 800.0000 mg | ORAL_TABLET | Freq: Three times a day (TID) | ORAL | 0 refills | Status: AC | PRN

## 2024-02-13 MED ORDER — PREDNISONE 10 MG PO TABS
ORAL_TABLET | ORAL | 0 refills | Status: DC
Start: 2024-02-13 — End: 2024-03-13

## 2024-02-13 MED ORDER — LIDOCAINE 5 % EX OINT: 1.0000 | TOPICAL_OINTMENT | CUTANEOUS | 3 refills | Status: DC | PRN

## 2024-02-13 NOTE — Progress Notes (Signed)
 Subjective:  Patient ID: Caleb Hunter, male    DOB: October 25, 1965,  MRN: [id number]  Chief Complaint  Patient presents with   Toe Pain    RM21 Toes locking up on L surgical foot form 02/22/23 causing pain in foot, ankle up leg preventing walking but has to force walk to release lock. Patient states using lidocaine  for the pain. Patients left 3rd-5th toes lock/are unable to to flex.    DOS: 02/22/2023 Procedure: Removal of left foot hardware  58 y.o. male returns for post-op check.  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 Denies N/V/F/Ch.   Objective:  There were no vitals filed for this visit. Body mass index is 32.5 kg/m. Constitutional Well developed. Well nourished.  Vascular Foot warm and well perfused. Capillary refill normal to all digits.  Calf is soft and supple, no posterior calf or knee pain, negative Homans' sign  Neurologic Normal speech. Oriented to person, place, and time. Epicritic sensation to light touch grossly present bilaterally.  Dermatologic Incisions are well-healed there is minimal hypertrophy  Orthopedic: Minimal edema today.  Limited range of motion of first second third MCP   Radiographs taken today show no unchanged alignment or joint abnormality  Study Result  Narrative & Impression  CLINICAL DATA:  Chronic foot pain.   EXAM: MRI OF THE LEFT TOES WITHOUT CONTRAST   TECHNIQUE: Multiplanar, multisequence MR imaging of the left foot was performed. No intravenous contrast was administered.   COMPARISON:  Radiographs 06/20/2023   FINDINGS: Solid-appearing fusion at the first TMT joint and at the first MTP joint. No obvious complicating features are identified. There are moderate degenerative changes at the interphalangeal joint of the great toe. I there appears to be fusion across the PIP joints of the second and third toes.    Mild to moderate degenerative changes involving the second and third MTP joints. No erosive changes. Widened space between the first and second metatarsal heads may be surgery related. The midfoot joint spaces are fairly well maintained. No stress fracture involving the metatarsals.   The major tendons and ligaments appear intact. Mild myositis involving the foot musculature along with mild fatty atrophy.   IMPRESSION: 1. Solid-appearing fusion at the first TMT joint and at the first MTP joint. No obvious complicating features are identified. 2. Moderate degenerative changes at the interphalangeal joint of the great toe. 3. Mild to moderate degenerative changes involving the second and third MTP joints. 4. Widened space between the first and second metatarsal heads may be surgery related. 5. The major tendons and ligaments appear intact. 6. Mild myositis involving the foot musculature along with mild fatty atrophy.     Electronically Signed   By: MYRTIS Stammer M.D.   On: 06/26/2023 11:32     Results  Collected Updated Procedure   11/19/2023 0929 11/28/2023 0537 Anti-CCP Ab, IgG + IgA (RDL) [[id number]]   Serum   Component Value Units  Anti-CCP Ab, IgG + IgA (RDL) <20  Units       11/19/2023 0929 11/28/2023 0537 Lupus anticoagulant [[id number]]   Blood   Component Value Units  dPT 35.5 sec  dPT Confirm Ratio 1.09 Ratio  Thrombin  Time 20.3 sec  PTT Lupus Anticoagulant 35.4 sec  Dilute Viper Venom Time 35.9 sec  Lupus Anticoag Interp Comment:         11/19/2023 0929 11/28/2023 0537 ANA [[id number]]  Blood   Component Value  Anti Nuclear Antibody (ANA) Negative       11/19/2023 0929 11/28/2023 0537 HLA-B27 Antigen [[id number]]   Blood   Component Value  HLA B27 Negative        11/19/2023 0929 11/28/2023 0537 Sedimentation Rate [[id number]]   (Abnormal)  Blood   Component Value Units  Sed Rate 102 High  mm/hr       11/19/2023 0929 11/28/2023 0537 Rheumatoid  factor [[id number]]   (Abnormal)  Blood   Component Value Units  Rheumatoid fact SerPl-aCnc 16.9 High  IU/mL       11/19/2023 0929 11/28/2023 0537 Uric Acid [[id number]]   (Abnormal)  Blood   Component Value Units  Uric Acid 12.5 High   mg/dL     Assessment:   1. 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  Reviewed today's x-rays as well as his progression.  I discussed with him the results of his lab work from his last visit, we previously called to notify him of lab work and the results but he did not receive the message.  I reviewed the results of his lab work and discussed on the likely does have gouty arthropathy as well as inflammatory arthritis most likely rheumatoid arthritis.  I would like to refer him to rheumatology and referral for this will be placed.  Also recommended repeating his sed rate and uric acid to reevaluate.  He may continue using ibuprofen  and lidocaine  ointment as needed.  We will also begin prednisone  until he is able to see rheumatology started on a high-dose prednisone  taper he will taper down to 10 mg for 1 month and then 5 mg to see if this alleviates his symptoms further.  Return to see me in 3 months to reevaluate.  Return in about 3 months (around 05/15/2024) for f/u gout / foot pain.

## 2024-03-13 ENCOUNTER — Encounter (HOSPITAL_BASED_OUTPATIENT_CLINIC_OR_DEPARTMENT_OTHER): Payer: Self-pay | Admitting: Emergency Medicine

## 2024-03-13 ENCOUNTER — Emergency Department (HOSPITAL_BASED_OUTPATIENT_CLINIC_OR_DEPARTMENT_OTHER)
Admission: EM | Admit: 2024-03-13 | Discharge: 2024-03-13 | Disposition: A | Discharge status: Home / Self Care | Attending: Emergency Medicine | Admitting: Emergency Medicine

## 2024-03-13 ENCOUNTER — Other Ambulatory Visit: Payer: Self-pay

## 2024-03-13 DIAGNOSIS — G8929 Other chronic pain: Secondary | ICD-10-CM | POA: Diagnosis not present

## 2024-03-13 DIAGNOSIS — M25531 Pain in right wrist: Secondary | ICD-10-CM | POA: Diagnosis present

## 2024-03-13 MED ORDER — PREDNISONE 10 MG (21) PO TBPK: ORAL_TABLET | ORAL | 0 refills | Status: DC

## 2024-03-13 MED ORDER — KETOROLAC TROMETHAMINE 30 MG/ML IJ SOLN
15.0000 mg | Freq: Once | INTRAMUSCULAR | Status: AC
  Administered 2024-03-13: 15 mg via INTRAMUSCULAR
  Filled 2024-03-13: qty 1

## 2024-03-13 NOTE — ED Triage Notes (Signed)
  Patient comes in with R wrist pain that has been going on for a couple days and got worse last night.  Patient states this is a chronic issue for him.  Has been taking ibuprofen  at home and took a 5 mg oxycodone  last night around midnight.  Pain 10/10, aching/throbbing.

## 2024-03-13 NOTE — ED Provider Notes (Signed)
 [place] [facility]  Provider Note  CSN: [id number] Arrival date & time: 03/13/24 0555  History Chief Complaint  Patient presents with   Wrist Pain    Caleb Hunter is a 58 y.o. male here with atraumatic R wrist pain, similar to previous. Diagnosed with carpal tunnel and also possible gout in the past. He has a wrist brace he wears inconsistently. He has oxycodone  10mg  prescribed by PCP, last dose was around midnight. He reports 2-3 days of worsening pain and swelling. Unable to wear the brace due to pain.    Home Medications Prior to Admission medications   Medication Sig Start Date End Date Taking? Authorizing Provider  predniSONE  (STERAPRED UNI-PAK 21 TAB) 10 MG (21) TBPK tablet 10mg  Tabs, 6 day taper. Use as directed 03/13/24  Yes Roselyn Carlin NOVAK, MD  albuterol  (PROVENTIL ) (2.5 MG/3ML) 0.083% nebulizer solution Take 2.5 mg by nebulization every 6 (six) hours as needed for wheezing or shortness of breath.    [provider]  albuterol  (VENTOLIN  HFA) 108 (90 Base) MCG/ACT inhaler Inhale 2 puffs into the lungs every 4 (four) hours as needed for shortness of breath or wheezing.    [provider]  allopurinol  (ZYLOPRIM ) 300 MG tablet Take 300 mg by mouth daily as needed (gout). 12/14/20   [provider]  amLODipine  (NORVASC ) 10 MG tablet Take 10 mg by mouth daily. 10/21/17   [provider]  atenolol -chlorthalidone  (TENORETIC ) 50-25 MG tablet Take 1 tablet by mouth daily.    [provider]  atorvastatin  (LIPITOR) 80 MG tablet Take 40 mg by mouth at bedtime.    [provider]  budesonide -formoterol  (SYMBICORT ) 80-4.5 MCG/ACT inhaler Inhale 2 puffs into the lungs 2 (two) times daily. 07/27/20   [provider]  clobetasol  cream (TEMOVATE ) 0.05 % Apply 1 application topically 2 (two) times daily as needed (irritation).    [provider]  Colchicine  0.6 MG CAPS Take 1 tablet by mouth  daily as needed (gout).    [provider]  colchicine  0.6 MG tablet Take 1 tablet (0.6 mg total) by mouth daily. 11/29/23   Gershon Donnice SAUNDERS, DPM  dicyclomine  (BENTYL ) 20 MG tablet Take 1 tablet (20 mg total) by mouth 2 (two) times daily. 05/07/23   Jerrol Agent, MD  EPINEPHrine  0.3 mg/0.3 mL IJ SOAJ injection Inject 0.3 mg into the muscle as needed for anaphylaxis. 04/26/20   [provider]  gabapentin  (NEURONTIN ) 300 MG capsule Take 1 capsule (300 mg total) by mouth 3 (three) times daily for 7 days. 03/02/22 02/13/24  Silva Juliene SAUNDERS, DPM  ibuprofen  (IBU) 800 MG tablet Take 1 tablet (800 mg total) by mouth every 8 (eight) hours as needed (Joint pain). 02/13/24 03/14/24  McDonald, Juliene SAUNDERS, DPM  lidocaine  (XYLOCAINE ) 5 % ointment Apply 1 Application topically as needed. 02/13/24   McDonald, Juliene SAUNDERS, DPM  meloxicam  (MOBIC ) 7.5 MG tablet Take 1 tablet (7.5 mg total) by mouth daily. 01/27/24   Emil Share, DO  montelukast  (SINGULAIR ) 10 MG tablet Take 10 mg by mouth at bedtime. 06/07/15   [provider]  Olopatadine  HCl 0.2 % SOLN Place 1 drop into both eyes daily as needed (eye allergies). 12/25/19   [provider]  omeprazole  (PRILOSEC) 20 MG capsule Take 20 mg by mouth daily. 08/01/20   [provider]  oxyCODONE -acetaminophen  (PERCOCET/ROXICET) 5-325 MG tablet Take 1 tablet by mouth every 6 (six) hours as needed for severe pain (pain score  7-10). 08/07/23   Zelaya, Oscar A, PA-C  potassium chloride  SA (KLOR-CON  M) 20 MEQ tablet Take 2 tablets (40 mEq total) by mouth 2 (two) times daily for 3 days. 05/07/23 02/13/24  Jerrol Agent, MD     Allergies    Lisinopril  and Shellfish allergy   Review of Systems   Review of Systems Please see HPI for pertinent positives and negatives  Physical Exam BP (!) 138/102 (BP Location: Left Arm)   Pulse 78   Temp 98.1 F (36.7 C) (Oral)   Resp 18   Ht 6' 4 (1.93 m)   Wt 121.1 kg   SpO2 96%   BMI 32.50 kg/m    Physical Exam Vitals and nursing note reviewed.  HENT:     Head: Normocephalic.     Nose: Nose normal.  Eyes:     Extraocular Movements: Extraocular movements intact.  Cardiovascular:     Pulses: Normal pulses.  Pulmonary:     Effort: Pulmonary effort is normal.  Musculoskeletal:        General: Swelling and tenderness (diffuse R wrist) present. No deformity.     Cervical back: Neck supple.  Skin:    Findings: No rash (on exposed skin).  Neurological:     Mental Status: He is alert and oriented to person, place, and time.  Psychiatric:        Mood and Affect: Mood normal.     ED Results / Procedures / Treatments   EKG None  Procedures Procedures  Medications Ordered in the ED Medications  ketorolac  (TORADOL ) 30 MG/ML injection 15 mg (has no administration in time range)    Initial Impression and Plan  Patient here with pain/swelling in R wrist, chronic problem for him. He already has pain medication at home. Will give a course of prednisone , he states his insurance will no longer pay for colchicine . Recommend he wear his brace and follow up with Ortho.   ED Course       MDM Rules/Calculators/A&P Medical Decision Making Problems Addressed: Chronic wrist pain, right: chronic illness or injury with exacerbation, progression, or side effects of treatment  Risk Prescription drug management.     Final Clinical Impression(s) / ED Diagnoses Final diagnoses:  Chronic wrist pain, right    Rx / DC Orders ED Discharge Orders          Ordered    predniSONE  (STERAPRED UNI-PAK 21 TAB) 10 MG (21) TBPK tablet        03/13/24 9385             Roselyn Carlin NOVAK, MD 03/13/24 [phone]

## 2024-03-30 ENCOUNTER — Other Ambulatory Visit: Payer: Self-pay | Admitting: Podiatry

## 2024-04-28 ENCOUNTER — Other Ambulatory Visit: Payer: Self-pay

## 2024-04-28 ENCOUNTER — Encounter (HOSPITAL_BASED_OUTPATIENT_CLINIC_OR_DEPARTMENT_OTHER): Payer: Self-pay

## 2024-04-28 ENCOUNTER — Emergency Department (HOSPITAL_BASED_OUTPATIENT_CLINIC_OR_DEPARTMENT_OTHER)

## 2024-04-28 ENCOUNTER — Emergency Department (HOSPITAL_BASED_OUTPATIENT_CLINIC_OR_DEPARTMENT_OTHER)
Admission: EM | Admit: 2024-04-28 | Discharge: 2024-04-29 | Disposition: A | Discharge status: Home / Self Care | Attending: Emergency Medicine | Admitting: Emergency Medicine

## 2024-04-28 DIAGNOSIS — K2980 Duodenitis without bleeding: Secondary | ICD-10-CM | POA: Insufficient documentation

## 2024-04-28 DIAGNOSIS — R1084 Generalized abdominal pain: Secondary | ICD-10-CM | POA: Diagnosis present

## 2024-04-28 DIAGNOSIS — I10 Essential (primary) hypertension: Secondary | ICD-10-CM | POA: Insufficient documentation

## 2024-04-28 DIAGNOSIS — J45909 Unspecified asthma, uncomplicated: Secondary | ICD-10-CM | POA: Insufficient documentation

## 2024-04-28 DIAGNOSIS — Z7951 Long term (current) use of inhaled steroids: Secondary | ICD-10-CM | POA: Diagnosis not present

## 2024-04-28 DIAGNOSIS — R748 Abnormal levels of other serum enzymes: Secondary | ICD-10-CM | POA: Diagnosis not present

## 2024-04-28 DIAGNOSIS — D72829 Elevated white blood cell count, unspecified: Secondary | ICD-10-CM | POA: Diagnosis not present

## 2024-04-28 DIAGNOSIS — E876 Hypokalemia: Secondary | ICD-10-CM | POA: Insufficient documentation

## 2024-04-28 DIAGNOSIS — Z79899 Other long term (current) drug therapy: Secondary | ICD-10-CM | POA: Diagnosis not present

## 2024-04-28 LAB — URINALYSIS, ROUTINE W REFLEX MICROSCOPIC
Glucose, UA: NEGATIVE mg/dL
Hgb urine dipstick: NEGATIVE
Ketones, ur: NEGATIVE mg/dL
Leukocytes,Ua: NEGATIVE
Nitrite: NEGATIVE
Protein, ur: 30 mg/dL — AB
Specific Gravity, Urine: 1.025 (ref 1.005–1.030)
pH: 6 (ref 5.0–8.0)

## 2024-04-28 LAB — CBC
HCT: 42.1 % (ref 39.0–52.0)
Hemoglobin: 14.1 g/dL (ref 13.0–17.0)
MCH: 27.3 pg (ref 26.0–34.0)
MCHC: 33.5 g/dL (ref 30.0–36.0)
MCV: 81.4 fL (ref 80.0–100.0)
Platelets: 151 K/uL (ref 150–400)
RBC: 5.17 MIL/uL (ref 4.22–5.81)
RDW: 15.6 % — ABNORMAL HIGH (ref 11.5–15.5)
WBC: 11 K/uL — ABNORMAL HIGH (ref 4.0–10.5)
nRBC: 0 % (ref 0.0–0.2)

## 2024-04-28 LAB — COMPREHENSIVE METABOLIC PANEL WITH GFR
ALT: 42 U/L (ref 0–44)
AST: 66 U/L — ABNORMAL HIGH (ref 15–41)
Albumin: 4 g/dL (ref 3.5–5.0)
Alkaline Phosphatase: 104 U/L (ref 38–126)
Anion gap: 14 (ref 5–15)
BUN: 18 mg/dL (ref 6–20)
CO2: 25 mmol/L (ref 22–32)
Calcium: 8.7 mg/dL — ABNORMAL LOW (ref 8.9–10.3)
Chloride: 96 mmol/L — ABNORMAL LOW (ref 98–111)
Creatinine, Ser: 1.05 mg/dL (ref 0.61–1.24)
GFR, Estimated: >60 mL/min (ref >60)
Glucose, Bld: 164 mg/dL — ABNORMAL HIGH (ref 70–99)
Potassium: 3.3 mmol/L — ABNORMAL LOW (ref 3.5–5.1)
Sodium: 135 mmol/L (ref 135–145)
Total Bilirubin: 1.2 mg/dL (ref 0.0–1.2)
Total Protein: 7.3 g/dL (ref 6.5–8.1)

## 2024-04-28 LAB — URINALYSIS, MICROSCOPIC (REFLEX): WBC, UA: NONE SEEN WBC/hpf (ref 0–5)

## 2024-04-28 LAB — LIPASE, BLOOD: Lipase: 112 U/L — ABNORMAL HIGH (ref 11–51)

## 2024-04-28 MED ORDER — ONDANSETRON 4 MG PO TBDP: 4.0000 mg | ORAL_TABLET | Freq: Three times a day (TID) | ORAL | 0 refills | Status: DC | PRN

## 2024-04-28 MED ORDER — CIPROFLOXACIN HCL 500 MG PO TABS: 500.0000 mg | ORAL_TABLET | Freq: Once | ORAL | Status: DC

## 2024-04-28 MED ORDER — AMOXICILLIN-POT CLAVULANATE 875-125 MG PO TABS
1.0000 | ORAL_TABLET | Freq: Once | ORAL | Status: AC
  Administered 2024-04-28: 1 via ORAL
  Filled 2024-04-28: qty 1

## 2024-04-28 MED ORDER — POTASSIUM CHLORIDE CRYS ER 20 MEQ PO TBCR
40.0000 meq | EXTENDED_RELEASE_TABLET | Freq: Once | ORAL | Status: AC
  Administered 2024-04-28: 40 meq via ORAL
  Filled 2024-04-28: qty 2

## 2024-04-28 MED ORDER — IOHEXOL 300 MG/ML  SOLN
125.0000 mL | Freq: Once | INTRAMUSCULAR | Status: AC | PRN
Start: 2024-04-28 — End: 2024-04-28
  Administered 2024-04-28: 125 mL via INTRAVENOUS

## 2024-04-28 MED ORDER — MORPHINE SULFATE (PF) 4 MG/ML IV SOLN
4.0000 mg | Freq: Once | INTRAVENOUS | Status: AC
  Administered 2024-04-28: 4 mg via INTRAVENOUS
  Filled 2024-04-28: qty 1

## 2024-04-28 MED ORDER — AMOXICILLIN-POT CLAVULANATE 875-125 MG PO TABS: 1.0000 | ORAL_TABLET | Freq: Two times a day (BID) | ORAL | 0 refills | Status: DC

## 2024-04-28 MED ORDER — METRONIDAZOLE 500 MG PO TABS: 500.0000 mg | ORAL_TABLET | Freq: Three times a day (TID) | ORAL | 0 refills | Status: DC

## 2024-04-28 MED ORDER — ONDANSETRON HCL 4 MG/2ML IJ SOLN
4.0000 mg | Freq: Once | INTRAMUSCULAR | Status: AC
  Administered 2024-04-28: 4 mg via INTRAVENOUS
  Filled 2024-04-28: qty 2

## 2024-04-28 MED ORDER — METRONIDAZOLE 500 MG PO TABS: 500.0000 mg | ORAL_TABLET | Freq: Once | ORAL | Status: DC

## 2024-04-28 MED ORDER — CIPROFLOXACIN HCL 500 MG PO TABS: 500.0000 mg | ORAL_TABLET | Freq: Two times a day (BID) | ORAL | 0 refills | Status: DC

## 2024-04-28 NOTE — ED Provider Notes (Signed)
 [facility] Provider Note   CSN: [id number] Arrival date & time: 04/28/24  1737     Patient presents with: Abdominal Pain   Caleb Hunter is a 58 y.o. male with a past medical history of HTN, HLD, asthma, GERD, gout, hepatic steatosis, IBS-CD presents emergency department for evaluation of generalized abdominal pain that started this morning.  He is unable to describe abdominal pain for me just that it hurts.  No radiation into back.  Last BM was today and normal.  Is passing gas. No recent travel nor abx. Denies fevers  Is followed by [facility] and currently on Linzess for constipation    Abdominal Pain      Prior to Admission medications   Medication Sig Start Date End Date Taking? Authorizing Provider  amoxicillin -clavulanate (AUGMENTIN ) 875-125 MG tablet Take 1 tablet by mouth every 12 (twelve) hours. 04/28/24  Yes Minnie Tinnie BRAVO, PA  ondansetron  (ZOFRAN -ODT) 4 MG disintegrating tablet Take 1 tablet (4 mg total) by mouth every 8 (eight) hours as needed for nausea or vomiting. 04/28/24  Yes Minnie Tinnie BRAVO, PA  albuterol  (PROVENTIL ) (2.5 MG/3ML) 0.083% nebulizer solution Take 2.5 mg by nebulization every 6 (six) hours as needed for wheezing or shortness of breath.    [provider]  albuterol  (VENTOLIN  HFA) 108 (90 Base) MCG/ACT inhaler Inhale 2 puffs into the lungs every 4 (four) hours as needed for shortness of breath or wheezing.    [provider]  allopurinol  (ZYLOPRIM ) 300 MG tablet Take 300 mg by mouth daily as needed (gout). 12/14/20   [provider]  amLODipine  (NORVASC ) 10 MG tablet Take 10 mg by mouth daily. 10/21/17   [provider]  atenolol -chlorthalidone  (TENORETIC ) 50-25 MG tablet Take 1 tablet by mouth daily.    [provider]  atorvastatin  (LIPITOR) 80 MG tablet Take 40 mg by mouth at bedtime.    [provider]  budesonide -formoterol  (SYMBICORT )  80-4.5 MCG/ACT inhaler Inhale 2 puffs into the lungs 2 (two) times daily. 07/27/20   [provider]  clobetasol  cream (TEMOVATE ) 0.05 % Apply 1 application topically 2 (two) times daily as needed (irritation).    [provider]  Colchicine  0.6 MG CAPS Take 1 tablet by mouth daily as needed (gout).    [provider]  colchicine  0.6 MG tablet TAKE 1 TABLET(0.6 MG) BY MOUTH DAILY 03/30/24   Gershon Donnice SAUNDERS, DPM  dicyclomine  (BENTYL ) 20 MG tablet Take 1 tablet (20 mg total) by mouth 2 (two) times daily. 05/07/23   Jerrol Agent, MD  EPINEPHrine  0.3 mg/0.3 mL IJ SOAJ injection Inject 0.3 mg into the muscle as needed for anaphylaxis. 04/26/20   [provider]  gabapentin  (NEURONTIN ) 300 MG capsule Take 1 capsule (300 mg total) by mouth 3 (three) times daily for 7 days. 03/02/22 02/13/24  McDonald, Juliene SAUNDERS, DPM  lidocaine  (XYLOCAINE ) 5 % ointment Apply 1 Application topically as needed. 02/13/24   McDonald, Juliene SAUNDERS, DPM  meloxicam  (MOBIC ) 7.5 MG tablet Take 1 tablet (7.5 mg total) by mouth daily. 01/27/24   Emil Share, DO  montelukast  (SINGULAIR ) 10 MG tablet Take 10 mg by mouth at bedtime. 06/07/15   [provider]  Olopatadine  HCl 0.2 % SOLN Place 1 drop into both eyes daily as needed (eye allergies). 12/25/19   [provider]  omeprazole  (PRILOSEC) 20 MG capsule Take 20 mg by mouth daily. 08/01/20   [provider]  oxyCODONE -acetaminophen  (PERCOCET/ROXICET) 5-325 MG  tablet Take 1 tablet by mouth every 6 (six) hours as needed for severe pain (pain score 7-10). 08/07/23   Zelaya, Oscar A, PA-C  potassium chloride  SA (KLOR-CON  M) 20 MEQ tablet Take 2 tablets (40 mEq total) by mouth 2 (two) times daily for 3 days. 05/07/23 02/13/24  Jerrol Agent, MD  predniSONE  (STERAPRED UNI-PAK 21 TAB) 10 MG (21) TBPK tablet 10mg  Tabs, 6 day taper. Use as directed 03/13/24   Roselyn Carlin NOVAK, MD    Allergies: Lisinopril  and Shellfish allergy    Review of  Systems  Gastrointestinal:  Positive for abdominal pain.    Updated Vital Signs BP (!) 141/85   Pulse 65   Temp 98.2 F (36.8 C)   Resp 18   SpO2 99%   Physical Exam Vitals and nursing note reviewed.  Constitutional:      General: He is not in acute distress.    Appearance: Normal appearance.  HENT:     Head: Normocephalic and atraumatic.  Eyes:     Conjunctiva/sclera: Conjunctivae normal.  Cardiovascular:     Rate and Rhythm: Normal rate.  Pulmonary:     Effort: Pulmonary effort is normal. No respiratory distress.  Abdominal:     Tenderness: There is generalized abdominal tenderness.     Comments: Nonsurgical abdomen with no peritoneal signs  Skin:    Coloration: Skin is not jaundiced or pale.  Neurological:     Mental Status: He is alert. Mental status is at baseline.     (all labs ordered are listed, but only abnormal results are displayed) Labs Reviewed  LIPASE, BLOOD - Abnormal; Notable for the following components:      Result Value   Lipase 112 (*)    All other components within normal limits  COMPREHENSIVE METABOLIC PANEL WITH GFR - Abnormal; Notable for the following components:   Potassium 3.3 (*)    Chloride 96 (*)    Glucose, Bld 164 (*)    Calcium  8.7 (*)    AST 66 (*)    All other components within normal limits  CBC - Abnormal; Notable for the following components:   WBC 11.0 (*)    RDW 15.6 (*)    All other components within normal limits  URINALYSIS, ROUTINE W REFLEX MICROSCOPIC - Abnormal; Notable for the following components:   Color, Urine AMBER (*)    Bilirubin Urine SMALL (*)    Protein, ur 30 (*)    All other components within normal limits  URINALYSIS, MICROSCOPIC (REFLEX) - Abnormal; Notable for the following components:   Bacteria, UA RARE (*)    All other components within normal limits    EKG: None  Radiology: CT ABDOMEN PELVIS W CONTRAST Result Date: 04/28/2024 CLINICAL DATA:  Generalized abdominal pain EXAM: CT ABDOMEN  AND PELVIS WITH CONTRAST TECHNIQUE: Multidetector CT imaging of the abdomen and pelvis was performed using the standard protocol following bolus administration of intravenous contrast. RADIATION DOSE REDUCTION: This exam was performed according to the departmental dose-optimization program which includes automated exposure control, adjustment of the mA and/or kV according to patient size and/or use of iterative reconstruction technique. CONTRAST:  OMNIPAQUE  IOHEXOL  300 MG/ML  SOLN COMPARISON:  05/07/2023 FINDINGS: Lower chest: No acute abnormality. Hepatobiliary: No focal liver abnormality is seen. No gallstones, gallbladder wall thickening, or biliary dilatation. Pancreas: Unremarkable. No pancreatic ductal dilatation or surrounding inflammatory changes. Spleen: Within normal limits as visualized. Adrenals/Urinary Tract: Adrenal glands are within normal limits. Kidneys demonstrate a normal enhancement pattern bilaterally.  Normal excretion is seen. No focal mass lesion is noted. The bladder is partially distended. Stomach/Bowel: Scattered diverticular change of the colon is noted without evidence of diverticulitis. The appendix has been surgically removed. Small bowel and stomach are within normal limits with the exception of some Peri duodenal inflammatory change in the second portion of the duodenum. This may represent some duodenitis no evidence of perforation is noted. Vascular/Lymphatic: No significant vascular findings are present. No enlarged abdominal or pelvic lymph nodes. Reproductive: Prostate is unremarkable. Other: No abdominal wall hernia or abnormality. No abdominopelvic ascites. Musculoskeletal: No acute or significant osseous findings. Postsurgical changes in the lumbar spine are seen. IMPRESSION: Peri duodenal inflammatory change consistent with duodenitis. No evidence of perforation to suggest abscess is seen. Diverticular change without diverticulitis. Electronically Signed   By: Oneil Devonshire M.D.   On: 04/28/2024 22:48     Medications Ordered in the ED  ondansetron  (ZOFRAN ) injection 4 mg (4 mg Intravenous Given 04/28/24 2218)  morphine  (PF) 4 MG/ML injection 4 mg (4 mg Intravenous Given 04/28/24 2219)  potassium chloride  SA (KLOR-CON  M) CR tablet 40 mEq (40 mEq Oral Given 04/28/24 2221)  iohexol  (OMNIPAQUE ) 300 MG/ML solution 125 mL (125 mLs Intravenous Contrast Given 04/28/24 2229)  morphine  (PF) 4 MG/ML injection 4 mg (4 mg Intravenous Given 04/28/24 2329)  amoxicillin -clavulanate (AUGMENTIN ) 875-125 MG per tablet 1 tablet (1 tablet Oral Given 04/28/24 2327)                                    Medical Decision Making Amount and/or Complexity of Data Reviewed Labs: ordered. Radiology: ordered.  Risk Prescription drug management.   Patient presents to the ED for concern of abdominal pain, N/V, this involves an extensive number of treatment options, and is a complaint that carries with it a high risk of complications and morbidity.  The differential diagnosis includes gastroenteritis, colitis, diverticulitis, pancreatitis, gastroparesis, obstruction, perforation   Co morbidities that complicate the patient evaluation  See HPI   Additional history obtained:  Additional history obtained from Nursing and Outside Medical Records   External records from outside source obtained and reviewed including GI note, gastroenterology note from 07/01/2023   Lab Tests:  I Ordered, and personally interpreted labs.  The pertinent results include:   Potassium 3.3 CBG 164 AST 66 Lipase 112 WBC 11 UA without infection   Imaging Studies ordered:  I ordered imaging studies including CT abdomen pelvis I independently visualized and interpreted imaging which showed  Peri duodenal inflammatory change consistent with duodenitis. No evidence of perforation to suggest abscess is seen. Diverticular change without diverticulitis. I agree with the radiologist  interpretation    Medicines ordered and prescription drug management:  I ordered medication including morphine , Zofran , Augmentin  for pain, nausea, duodenitis Reevaluation of the patient after these medicines showed that the patient improved I have reviewed the patients home medicines and have made adjustments as needed     Problem List / ED Course:  Generalized abdominal pain Duodenitis Vital signs hemodynamically stable with no tachycardia no fever Provided morphine  for pain and Zofran  for nausea Mild leukocytosis of 11.  Does not meet sepsis criteria Elevated lipase of 112.  No pancreatitis nor pancreatic abscess on CT imaging Initially ordered Cipro and Flagyl however when I told patient that he was unable to drink alcohol on [facility], he reports that he would get the shakes and normally drinks daily.  Has hx of hepatic steatosis.  I then switched antibiotic to Augmentin  to ensure no withdrawal from alcohol Passes p.o. challenge Provided Augmentin  for duodenitis in ED as well as prescription.  Discussed antibiotic stewardship Will have patient follow-up with [facility] where he is established   Hypokalemia Mild.  3.3 Provided p.o. supplementation   Reevaluation:  After the interventions noted above, I reevaluated the patient and found that they have :improved    Dispostion:  After consideration of the diagnostic results and the patients response to treatment, I feel that the patent would benefit from outpatient management symptomatic treatment.   Discussed ED workup, disposition, return to ED precautions with patient who expresses understanding agrees with plan.  All questions answered to their satisfaction.  They are agreeable to plan.  Discharge instructions provided on paperwork Final diagnoses:  Generalized abdominal pain  Duodenitis    ED Discharge Orders          Ordered    ciprofloxacin (CIPRO) 500 MG tablet  2 times daily,   Status:   Discontinued        04/28/24 2259    metroNIDAZOLE (FLAGYL) 500 MG tablet  3 times daily,   Status:  Discontinued        04/28/24 2259    ondansetron  (ZOFRAN -ODT) 4 MG disintegrating tablet  Every 8 hours PRN        04/28/24 2313    amoxicillin -clavulanate (AUGMENTIN ) 875-125 MG tablet  Every 12 hours        04/28/24 2315             Minnie Tinnie BRAVO, PA 04/29/24 0002    Cottie Donnice PARAS, MD 05/02/24 1003

## 2024-04-28 NOTE — ED Triage Notes (Signed)
 Reports generalized abd pain since this morning. Constipation, nausea.  Denies emesis, fevers

## 2024-04-28 NOTE — Discharge Instructions (Addendum)
 Thank you for letting us  evaluate you today.  You had inflammation of your duodenum.  This may be due to infection or inflammation.  We are treating for possible infection today.  We have given you a dose of antibiotics here in emergency department as well as pain medicine.  I have sent antibiotics to your pharmacy.   Return to ED if you experience intractable vomiting causing you to be unable to keep fluids down or worsening symptoms otherwise follow up with your GI doctor at [facility]
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
## 2024-05-04 NOTE — Progress Notes (Signed)
 Assessment 1. Idiopathic chronic gout of multiple sites with tophus  C-Reactive Protein (CRP)   Sedimentation Rate (ESR)    2. Multiple joint pain  sulfaSALAzine (AZULFIDINE EN-TABS) 500 mg tablet   C-Reactive Protein (CRP)   Sedimentation Rate (ESR)    3. Drug therapy      4. Chronic pain disorder      5. Chronic neck and back pain      6. Rheumatoid factor positive  sulfaSALAzine (AZULFIDINE EN-TABS) 500 mg tablet   C-Reactive Protein (CRP)   Sedimentation Rate (ESR)    7. Complex regional pain syndrome i of left lower limb        Plan/Orders Continue allopurinol . Positive RAF, elevated inflammatory markers, negative CCP, ?inflammatory arthritis like RA. SSZ 500 mg bid, benefits, risks, alternatives.  Check labs as above. Overlapping signs and symptoms of chronic pain disorder, complex regional pain of left LE.  Return in about 3 months (around 08/04/2024).  Chief Complaint Caleb Hunter is here for follow up gout.   HPI Follow up gout. On allopurinol  300 mg daily, prednisone  5 mg daily.  Medical history of asthma, DM, chronic pain disorder, polypharmacy, HTN, arthritis, degenerative spine. He comes in with significant other. On gabapentin  and oxycodone  for chronic pain. History of foot surgery, complex regional pain syndrome.  Left foot surgery x 3. Pain in right wrists, right knee, left foot. Many year history of joint pain diagnosed as gout. No tophi. No nephrolithiasis. No CKD. NCV/EMG for left arm numbness and tingling, showing apparent cervical radiculopathy.  Numbness in both hands. No psoriasis. No bowel or eye inflammation.  No family history of gout or RA.  Vital Signs BP 132/84 (BP Location: Left arm, Patient Position: Sitting)   Pulse 82   Ht 1.93 m (6' 4)   Wt 125 kg (276 lb)   SpO2 94%   BMI 33.60 kg/m  Physical Exam Pt is alert and oriented x 3. Answers questions appropriately.  Heart: normal S1, S2. No S3, no murmur, no thrills. No  JVD. Lungs: no dyspnea observed, normal breath sounds on auscultation.  Extremities: no cyanosis, no edema, no calves tenderness. Skin: warm, dry. Tenderness left foot. No hot swollen joints. No tophi.  PMH Medical History[1]  PSH Surgical History[2]  Allergies Lisinopril  and Shellfish containing products  Medications Medications Ordered Prior to Encounter[3]   Family Hx Family History[4]  Social Hx  Social History[5]       [1] Past Medical History: Diagnosis Date  . Asthma   . HTN (hypertension)   . Hypercholesterolemia   [2] Past Surgical History: Procedure Laterality Date  . FOOT SURGERY    [3] Current Outpatient Medications on File Prior to Visit  Medication Sig Dispense Refill  . albuterol  2.5 mg /3 mL (0.083 %) nebulizer solution USE 1 VIAL BY NEBULIZER EVERY 6 HOURS AS NEEDED FOR WHEEZING 180 mL 0  . albuterol  HFA (PROVENTIL  HFA;VENTOLIN  HFA;PROAIR  HFA) 90 mcg/actuation inhaler INHALE 2 PUFFS INTO THE LUNGS EVERY 6 HOURS AS NEEDED FOR WHEEZING 8.5 g 0  . allopurinoL  (ZYLOPRIM ) 300 mg tablet Take 1 tablet (300 mg total) by mouth daily. 90 tablet 3  . amLODIPine  (NORVASC ) 10 mg tablet TK 1 T PO QD    . atenoloL -chlorthalidone  (TENORETIC ) 50-25 mg per tablet Take 1 tablet by mouth Once Daily.    SABRA azelastine (OPTIVAR) 0.05 % drop ophthalmic solution Administer 1 drop into each eyes Once Daily.    . budesonide -formoteroL  (Symbicort ) 160-4.5 mcg/actuation inhaler INHALE 2 PUFFS  INTO THE LUNGS TWICE DAILY 1 each 0  . cetirizine (ZyrTEC) 10 mg tablet Take 10 mg by mouth as needed.    . clobetasoL  (TEMOVATE ) 0.05 % cream Apply 1 Application topically 2 (two) times a day.    . colchicine  (MITIGARE ) 0.6 mg cap capsule Take 1 tablet by mouth Once Daily.    . EPINEPHrine  (EPIPEN ) 0.3 mg/0.3 mL injection syringe Inject 0.3 mg into the muscle once as needed for anaphylaxis for up to 1 dose. 2 each 1  . ergocalciferol (VITAMIN D2) 1,250 mcg (50,000 unit) capsule 1  capsule.    . fluticasone  propionate (FLONASE ) 50 mcg/spray nasal spray Administer 1 spray into each nostril Once Daily for 30 days. 1 Inhaler 2  . gabapentin  (NEURONTIN ) 300 mg capsule TAKE 1 CAPSULE BY MOUTH AT BEDTIME 30 capsule 0  . hydroCHLOROthiazide (HYDRODIURIL) 25 mg tablet Take 25 mg by mouth Once Daily.    . hydrOXYzine  (ATARAX ) 25 mg tablet Take 25 mg by mouth nightly.    . hydrOXYzine  (VISTARIL ) 25 mg capsule Take 1 capsule by mouth nightly.    . ibuprofen  (MOTRIN ) 600 mg tablet Take 1 tablet by mouth as needed.    . lidocaine  (LIDODERM ) 5 % patch     . linaCLOtide (LINZESS) 72 mcg cap capsule Take 1 capsule (72 mcg total) by mouth daily. 90 capsule 1  . methocarbamoL (ROBAXIN) 500 mg tablet Take 1 tablet twice per day as needed for spasms. 60 tablet 1  . mometasone -formoterol  (Dulera ) 200-5 mcg/actuation HFAA inhaler Inhale 2 puffs 2 (two) times a day.    . montelukast  (SINGULAIR ) 10 mg tablet TAKE 1 TABLET(10 MG) BY MOUTH DAILY 90 tablet 0  . olopatadine  (PATADAY ) 0.2 % ophthalmic solution Administer 1 drop into each eyes Once Daily.    . ondansetron  (ZOFRAN ) 8 mg tablet Take 1 tablet by mouth every 8 (eight) hours as needed.    . oxyCODONE -acetaminophen  (PERCOCET) 10-325 mg per tablet Take 1 tablet by mouth 4 (four) times a day as needed.    . primidone (MYSOLINE) 50 mg tablet     . resmetirom (Rezdiffra) 100 mg tab Take 1 tablet (100 mg total) by mouth daily. 30 tablet 5  . Tobradex 0.3-0.1 % oint ophthalmic ointment Administer 1 inch into each eyes Once Daily.    . Xiidra 5 % dpet ophthalmic solution Administer 1 drop into each eyes.    SABRA Zetia 10 mg tablet Take 10 mg by mouth daily. for 30 days    . [DISCONTINUED] predniSONE  (DELTASONE ) 5 mg tablet Take 1 tablet (5 mg total) by mouth 2 (two) times a day. 60 tablet 0   No current facility-administered medications on file prior to visit.  [4] Family History Problem Relation Name Age of Onset  . Ankylosing spondylitis Neg Hx     . Psoriasis Neg Hx    . Gout Neg Hx    . Rheum arthritis Neg Hx    . Lupus Neg Hx    [5] Social History Tobacco Use  . Smoking status: Never  . Smokeless tobacco: Never  Substance Use Topics  . Alcohol use: Yes  . Drug use: Never

## 2024-05-18 ENCOUNTER — Encounter: Admitting: Rheumatology

## 2024-05-18 DIAGNOSIS — Z79899 Other long term (current) drug therapy: Secondary | ICD-10-CM

## 2024-05-18 DIAGNOSIS — M51369 Other intervertebral disc degeneration, lumbar region without mention of lumbar back pain or lower extremity pain: Secondary | ICD-10-CM

## 2024-05-18 DIAGNOSIS — M48062 Spinal stenosis, lumbar region with neurogenic claudication: Secondary | ICD-10-CM

## 2024-05-18 DIAGNOSIS — M0580 Other rheumatoid arthritis with rheumatoid factor of unspecified site: Secondary | ICD-10-CM

## 2024-05-18 DIAGNOSIS — M2141 Flat foot [pes planus] (acquired), right foot: Secondary | ICD-10-CM

## 2024-05-25 ENCOUNTER — Ambulatory Visit: Admitting: Podiatry

## 2024-05-27 ENCOUNTER — Emergency Department (HOSPITAL_BASED_OUTPATIENT_CLINIC_OR_DEPARTMENT_OTHER)
Admission: EM | Admit: 2024-05-27 | Discharge: 2024-05-28 | Discharge status: Against Medical Advice | Source: Home / Self Care | Attending: Emergency Medicine | Admitting: Emergency Medicine

## 2024-05-27 ENCOUNTER — Other Ambulatory Visit: Payer: Self-pay

## 2024-05-27 ENCOUNTER — Telehealth (HOSPITAL_COMMUNITY): Payer: Self-pay | Admitting: Emergency Medicine

## 2024-05-27 ENCOUNTER — Emergency Department (HOSPITAL_COMMUNITY)
Admission: EM | Admit: 2024-05-27 | Discharge: 2024-05-27 | Discharge status: Against Medical Advice | Source: Home / Self Care

## 2024-05-27 ENCOUNTER — Encounter (HOSPITAL_BASED_OUTPATIENT_CLINIC_OR_DEPARTMENT_OTHER): Payer: Self-pay | Admitting: Emergency Medicine

## 2024-05-27 ENCOUNTER — Emergency Department (HOSPITAL_COMMUNITY)

## 2024-05-27 DIAGNOSIS — K852 Alcohol induced acute pancreatitis without necrosis or infection: Secondary | ICD-10-CM | POA: Insufficient documentation

## 2024-05-27 DIAGNOSIS — D72829 Elevated white blood cell count, unspecified: Secondary | ICD-10-CM | POA: Insufficient documentation

## 2024-05-27 DIAGNOSIS — Z5321 Procedure and treatment not carried out due to patient leaving prior to being seen by health care provider: Secondary | ICD-10-CM | POA: Insufficient documentation

## 2024-05-27 DIAGNOSIS — R110 Nausea: Secondary | ICD-10-CM | POA: Insufficient documentation

## 2024-05-27 DIAGNOSIS — R1013 Epigastric pain: Secondary | ICD-10-CM | POA: Insufficient documentation

## 2024-05-27 DIAGNOSIS — R944 Abnormal results of kidney function studies: Secondary | ICD-10-CM | POA: Insufficient documentation

## 2024-05-27 DIAGNOSIS — Z79899 Other long term (current) drug therapy: Secondary | ICD-10-CM | POA: Insufficient documentation

## 2024-05-27 DIAGNOSIS — R748 Abnormal levels of other serum enzymes: Secondary | ICD-10-CM | POA: Insufficient documentation

## 2024-05-27 DIAGNOSIS — R739 Hyperglycemia, unspecified: Secondary | ICD-10-CM

## 2024-05-27 DIAGNOSIS — R9431 Abnormal electrocardiogram [ECG] [EKG]: Secondary | ICD-10-CM

## 2024-05-27 DIAGNOSIS — K859 Acute pancreatitis without necrosis or infection, unspecified: Secondary | ICD-10-CM | POA: Diagnosis present

## 2024-05-27 LAB — COMPREHENSIVE METABOLIC PANEL WITH GFR
ALT: 54 U/L — ABNORMAL HIGH (ref 0–44)
AST: 41 U/L (ref 15–41)
Albumin: 3.6 g/dL (ref 3.5–5.0)
Alkaline Phosphatase: 108 U/L (ref 38–126)
Anion gap: 18 — ABNORMAL HIGH (ref 5–15)
BUN: 19 mg/dL (ref 6–20)
CO2: 23 mmol/L (ref 22–32)
Calcium: 9.2 mg/dL (ref 8.9–10.3)
Chloride: 91 mmol/L — ABNORMAL LOW (ref 98–111)
Creatinine, Ser: 1.23 mg/dL (ref 0.61–1.24)
GFR, Estimated: >60 mL/min (ref >60)
Glucose, Bld: 296 mg/dL — ABNORMAL HIGH (ref 70–99)
Potassium: 3.9 mmol/L (ref 3.5–5.1)
Sodium: 132 mmol/L — ABNORMAL LOW (ref 135–145)
Total Bilirubin: 1.2 mg/dL (ref 0.0–1.2)
Total Protein: 7.8 g/dL (ref 6.5–8.1)

## 2024-05-27 LAB — I-STAT CHEM 8, ED
BUN: 23 mg/dL — ABNORMAL HIGH (ref 6–20)
Calcium, Ion: 1.03 mmol/L — ABNORMAL LOW (ref 1.15–1.40)
Chloride: 95 mmol/L — ABNORMAL LOW (ref 98–111)
Creatinine, Ser: 1.2 mg/dL (ref 0.61–1.24)
Glucose, Bld: 309 mg/dL — ABNORMAL HIGH (ref 70–99)
HCT: 46 % (ref 39.0–52.0)
Hemoglobin: 15.6 g/dL (ref 13.0–17.0)
Potassium: 3.8 mmol/L (ref 3.5–5.1)
Sodium: 132 mmol/L — ABNORMAL LOW (ref 135–145)
TCO2: 28 mmol/L (ref 22–32)

## 2024-05-27 LAB — CBC WITH DIFFERENTIAL/PLATELET
Abs Immature Granulocytes: 0.12 K/uL — ABNORMAL HIGH (ref 0.00–0.07)
Basophils Absolute: 0.1 K/uL (ref 0.0–0.1)
Basophils Relative: 1 %
Eosinophils Absolute: 0.2 K/uL (ref 0.0–0.5)
Eosinophils Relative: 1 %
HCT: 42.7 % (ref 39.0–52.0)
Hemoglobin: 14.5 g/dL (ref 13.0–17.0)
Immature Granulocytes: 1 %
Lymphocytes Relative: 25 %
Lymphs Abs: 2.9 K/uL (ref 0.7–4.0)
MCH: 28.4 pg (ref 26.0–34.0)
MCHC: 34 g/dL (ref 30.0–36.0)
MCV: 83.6 fL (ref 80.0–100.0)
Monocytes Absolute: 0.9 K/uL (ref 0.1–1.0)
Monocytes Relative: 8 %
Neutro Abs: 7.3 K/uL (ref 1.7–7.7)
Neutrophils Relative %: 64 %
Platelets: 173 K/uL (ref 150–400)
RBC: 5.11 MIL/uL (ref 4.22–5.81)
RDW: 15.1 % (ref 11.5–15.5)
WBC: 11.4 K/uL — ABNORMAL HIGH (ref 4.0–10.5)
nRBC: 0.2 % (ref 0.0–0.2)

## 2024-05-27 LAB — LIPASE, BLOOD: Lipase: 630 U/L — ABNORMAL HIGH (ref 11–51)

## 2024-05-27 LAB — I-STAT CG4 LACTIC ACID, ED: Lactic Acid, Venous: 2.2 mmol/L (ref 0.5–1.9)

## 2024-05-27 MED ORDER — ONDANSETRON HCL 4 MG/2ML IJ SOLN
4.0000 mg | Freq: Once | INTRAMUSCULAR | Status: AC
  Administered 2024-05-27: 4 mg via INTRAVENOUS
  Filled 2024-05-27: qty 2

## 2024-05-27 MED ORDER — MORPHINE SULFATE (PF) 2 MG/ML IV SOLN
4.0000 mg | Freq: Once | INTRAVENOUS | Status: AC
  Administered 2024-05-27: 4 mg via INTRAVENOUS
  Filled 2024-05-27: qty 2

## 2024-05-27 MED ORDER — SODIUM CHLORIDE 0.9 % IV BOLUS
500.0000 mL | Freq: Once | INTRAVENOUS | Status: AC
  Administered 2024-05-27: 500 mL via INTRAVENOUS

## 2024-05-27 MED ORDER — FENTANYL CITRATE (PF) 50 MCG/ML IJ SOSY
50.0000 ug | PREFILLED_SYRINGE | Freq: Once | INTRAMUSCULAR | Status: AC
  Administered 2024-05-27: 50 ug via INTRAVENOUS
  Filled 2024-05-27: qty 1

## 2024-05-27 MED ORDER — IOHEXOL 350 MG/ML SOLN
75.0000 mL | Freq: Once | INTRAVENOUS | Status: AC | PRN
  Administered 2024-05-27: 75 mL via INTRAVENOUS

## 2024-05-27 MED ORDER — TRIMETHOBENZAMIDE HCL 100 MG/ML IM SOLN
200.0000 mg | INTRAMUSCULAR | Status: AC
  Administered 2024-05-27: 200 mg via INTRAMUSCULAR
  Filled 2024-05-27: qty 2

## 2024-05-27 NOTE — ED Notes (Signed)
 Pt states he is leaving and he does not want to wait any longer. Pt left with wife AMA

## 2024-05-27 NOTE — ED Notes (Signed)
 Pt hooked up to the monitor with the cardiac leads, BP cuff and pulse ox

## 2024-05-27 NOTE — ED Triage Notes (Signed)
 Pt presents with severe abdominal pain.  Left [facility] earlier d/t wait times.  Lab results in chart.  Pt was sent to ED for possible admission. Recent diagnosis of duodenitis.

## 2024-05-27 NOTE — ED Triage Notes (Signed)
 Patient arrives via POV for abdominal pain. Patient endorses nausea and constipation. Takes linzess for IBS. Seen at [facility] ED for diverticulitis and sent here today by PCP.

## 2024-05-27 NOTE — Telephone Encounter (Signed)
 Received a call from patient's outpatient physician, Dr. Zachary Conger, who states that patient was recently evaluated in the emergency department for abdominal pain, and was reportedly discharged with referral to GI.  Per Dr. Conger, he feels that the patient is having worsening symptoms, and states that he is sending the patient back to the emergency department for admission.  I stated that the emergency department can evaluate the patient on the patient's arrival to the ED.

## 2024-05-27 NOTE — ED Provider Triage Note (Signed)
 Emergency Medicine Provider Triage Evaluation Note  Caleb Hunter , a 58 y.o. male  was evaluated in triage.  Pt complains of abdominal pain.  Started last night.  In the epigastrium.  Similar to prior duodenitis which she was diagnosed with last month.  Some nausea, but no vomiting.  Had a bowel movement this morning  Review of Systems  Positive: Abdominal pain Negative: Blood in stool or vomit  Physical Exam  BP (!) 189/103 (BP Location: Right Arm)   Pulse (!) 51   Temp 97.8 F (36.6 C)   Resp 18   Ht 6' 4 (1.93 m)   Wt 122.5 kg   SpO2 94%   BMI 32.87 kg/m  Gen:   Awake, no distress appears uncomfortable Resp:  Normal effort  MSK:   Moves extremities without difficulty  Other:  Mild to moderate diffuse tenderness to abdomen  Medical Decision Making  Medically screening exam initiated at 12:21 PM.  Appropriate orders placed.  Caleb Hunter was informed that the remainder of the evaluation will be completed by another provider, this initial triage assessment does not replace that evaluation, and the importance of remaining in the ED until their evaluation is complete.  58 year old presented with abdominal pain.  Appears uncomfortable, he is hypertensive.  CT scan last month with duodenitis.  Will get screening labs repeat CT scan.  Given morphine  and Zofran    Caleb Hunter PARAS, DO 05/27/24 1231

## 2024-05-28 ENCOUNTER — Ambulatory Visit (HOSPITAL_COMMUNITY): Payer: Self-pay

## 2024-05-28 ENCOUNTER — Other Ambulatory Visit: Payer: Self-pay

## 2024-05-28 ENCOUNTER — Inpatient Hospital Stay (HOSPITAL_COMMUNITY)
Admission: EM | Admit: 2024-05-28 | Discharge: 2024-05-30 | DRG: 439 | Discharge status: Against Medical Advice | Attending: Internal Medicine | Admitting: Internal Medicine

## 2024-05-28 ENCOUNTER — Encounter (HOSPITAL_COMMUNITY): Payer: Self-pay | Admitting: Internal Medicine

## 2024-05-28 DIAGNOSIS — Z7951 Long term (current) use of inhaled steroids: Secondary | ICD-10-CM

## 2024-05-28 DIAGNOSIS — E1165 Type 2 diabetes mellitus with hyperglycemia: Secondary | ICD-10-CM | POA: Diagnosis present

## 2024-05-28 DIAGNOSIS — F419 Anxiety disorder, unspecified: Secondary | ICD-10-CM | POA: Diagnosis present

## 2024-05-28 DIAGNOSIS — F10139 Alcohol abuse with withdrawal, unspecified: Secondary | ICD-10-CM | POA: Diagnosis present

## 2024-05-28 DIAGNOSIS — E785 Hyperlipidemia, unspecified: Secondary | ICD-10-CM | POA: Diagnosis present

## 2024-05-28 DIAGNOSIS — I10 Essential (primary) hypertension: Secondary | ICD-10-CM | POA: Diagnosis present

## 2024-05-28 DIAGNOSIS — Z91013 Allergy to seafood: Secondary | ICD-10-CM | POA: Diagnosis not present

## 2024-05-28 DIAGNOSIS — Z6832 Body mass index (BMI) 32.0-32.9, adult: Secondary | ICD-10-CM

## 2024-05-28 DIAGNOSIS — Z888 Allergy status to other drugs, medicaments and biological substances status: Secondary | ICD-10-CM

## 2024-05-28 DIAGNOSIS — K859 Acute pancreatitis without necrosis or infection, unspecified: Secondary | ICD-10-CM | POA: Diagnosis present

## 2024-05-28 DIAGNOSIS — K852 Alcohol induced acute pancreatitis without necrosis or infection: Principal | ICD-10-CM | POA: Diagnosis present

## 2024-05-28 DIAGNOSIS — F32A Depression, unspecified: Secondary | ICD-10-CM | POA: Diagnosis present

## 2024-05-28 DIAGNOSIS — Z5329 Procedure and treatment not carried out because of patient's decision for other reasons: Secondary | ICD-10-CM | POA: Diagnosis present

## 2024-05-28 DIAGNOSIS — Z981 Arthrodesis status: Secondary | ICD-10-CM

## 2024-05-28 DIAGNOSIS — M109 Gout, unspecified: Secondary | ICD-10-CM | POA: Diagnosis present

## 2024-05-28 DIAGNOSIS — Z79899 Other long term (current) drug therapy: Secondary | ICD-10-CM | POA: Diagnosis not present

## 2024-05-28 DIAGNOSIS — E66811 Obesity, class 1: Secondary | ICD-10-CM | POA: Diagnosis present

## 2024-05-28 DIAGNOSIS — E119 Type 2 diabetes mellitus without complications: Secondary | ICD-10-CM

## 2024-05-28 DIAGNOSIS — J4540 Moderate persistent asthma, uncomplicated: Secondary | ICD-10-CM | POA: Diagnosis present

## 2024-05-28 DIAGNOSIS — F1090 Alcohol use, unspecified, uncomplicated: Secondary | ICD-10-CM | POA: Diagnosis present

## 2024-05-28 DIAGNOSIS — Z8711 Personal history of peptic ulcer disease: Secondary | ICD-10-CM

## 2024-05-28 DIAGNOSIS — R509 Fever, unspecified: Secondary | ICD-10-CM | POA: Insufficient documentation

## 2024-05-28 DIAGNOSIS — K219 Gastro-esophageal reflux disease without esophagitis: Secondary | ICD-10-CM | POA: Diagnosis present

## 2024-05-28 LAB — COMPREHENSIVE METABOLIC PANEL WITH GFR
ALT: 51 U/L — ABNORMAL HIGH (ref 0–44)
AST: 50 U/L — ABNORMAL HIGH (ref 15–41)
Albumin: 3.9 g/dL (ref 3.5–5.0)
Alkaline Phosphatase: 117 U/L (ref 38–126)
Anion gap: 13 (ref 5–15)
BUN: 24 mg/dL — ABNORMAL HIGH (ref 6–20)
CO2: 27 mmol/L (ref 22–32)
Calcium: 9.6 mg/dL (ref 8.9–10.3)
Chloride: 93 mmol/L — ABNORMAL LOW (ref 98–111)
Creatinine, Ser: 1.16 mg/dL (ref 0.61–1.24)
GFR, Estimated: >60 mL/min (ref >60)
Glucose, Bld: 248 mg/dL — ABNORMAL HIGH (ref 70–99)
Potassium: 4.1 mmol/L (ref 3.5–5.1)
Sodium: 132 mmol/L — ABNORMAL LOW (ref 135–145)
Total Bilirubin: 1.2 mg/dL (ref 0.0–1.2)
Total Protein: 7.8 g/dL (ref 6.5–8.1)

## 2024-05-28 LAB — CBG MONITORING, ED: Glucose-Capillary: 202 mg/dL — ABNORMAL HIGH (ref 70–99)

## 2024-05-28 LAB — CBC WITH DIFFERENTIAL/PLATELET
Abs Immature Granulocytes: 0.12 K/uL — ABNORMAL HIGH (ref 0.00–0.07)
Basophils Absolute: 0 K/uL (ref 0.0–0.1)
Basophils Relative: 0 %
Eosinophils Absolute: 0.2 K/uL (ref 0.0–0.5)
Eosinophils Relative: 2 %
HCT: 41.9 % (ref 39.0–52.0)
Hemoglobin: 13.8 g/dL (ref 13.0–17.0)
Immature Granulocytes: 1 %
Lymphocytes Relative: 15 %
Lymphs Abs: 1.7 K/uL (ref 0.7–4.0)
MCH: 27.9 pg (ref 26.0–34.0)
MCHC: 32.9 g/dL (ref 30.0–36.0)
MCV: 84.6 fL (ref 80.0–100.0)
Monocytes Absolute: 0.7 K/uL (ref 0.1–1.0)
Monocytes Relative: 7 %
Neutro Abs: 8.2 K/uL — ABNORMAL HIGH (ref 1.7–7.7)
Neutrophils Relative %: 75 %
Platelets: 168 K/uL (ref 150–400)
RBC: 4.95 MIL/uL (ref 4.22–5.81)
RDW: 15 % (ref 11.5–15.5)
WBC: 10.9 K/uL — ABNORMAL HIGH (ref 4.0–10.5)
nRBC: 0 % (ref 0.0–0.2)

## 2024-05-28 LAB — LIPID PANEL
Cholesterol: 317 mg/dL — ABNORMAL HIGH (ref 0–200)
HDL: 84 mg/dL (ref >40)
LDL Cholesterol: 194 mg/dL — ABNORMAL HIGH (ref 0–99)
Total CHOL/HDL Ratio: 3.8 ratio
Triglycerides: 196 mg/dL — ABNORMAL HIGH (ref <150)
VLDL: 39 mg/dL (ref 0–40)

## 2024-05-28 LAB — LIPASE, BLOOD: Lipase: 1015 U/L — ABNORMAL HIGH (ref 11–51)

## 2024-05-28 LAB — HEMOGLOBIN A1C
Hgb A1c MFr Bld: 8.5 % — ABNORMAL HIGH (ref 4.8–5.6)
Mean Plasma Glucose: 197.25 mg/dL

## 2024-05-28 LAB — GLUCOSE, CAPILLARY
Glucose-Capillary: 200 mg/dL — ABNORMAL HIGH (ref 70–99)
Glucose-Capillary: 217 mg/dL — ABNORMAL HIGH (ref 70–99)

## 2024-05-28 LAB — HIV ANTIBODY (ROUTINE TESTING W REFLEX): HIV Screen 4th Generation wRfx: NONREACTIVE

## 2024-05-28 MED ORDER — COLCHICINE 0.6 MG PO TABS
0.6000 mg | ORAL_TABLET | Freq: Every day | ORAL | Status: DC
  Administered 2024-05-28 – 2024-05-29 (×2): 0.6 mg via ORAL
  Filled 2024-05-28 (×3): qty 1

## 2024-05-28 MED ORDER — HYDROMORPHONE HCL 1 MG/ML IJ SOLN
0.5000 mg | INTRAMUSCULAR | Status: DC | PRN
  Administered 2024-05-28 – 2024-05-29 (×5): 0.5 mg via INTRAVENOUS
  Filled 2024-05-28 (×4): qty 0.5
  Filled 2024-05-28: qty 1

## 2024-05-28 MED ORDER — OXYCODONE HCL 5 MG PO TABS
5.0000 mg | ORAL_TABLET | ORAL | Status: DC | PRN
  Administered 2024-05-28 – 2024-05-29 (×3): 5 mg via ORAL
  Filled 2024-05-28 (×5): qty 1

## 2024-05-28 MED ORDER — THIAMINE HCL 100 MG/ML IJ SOLN: 100.0000 mg | Freq: Every day | INTRAMUSCULAR | Status: DC

## 2024-05-28 MED ORDER — ATORVASTATIN CALCIUM 40 MG PO TABS
40.0000 mg | ORAL_TABLET | Freq: Every day | ORAL | Status: DC
  Administered 2024-05-28 – 2024-05-29 (×2): 40 mg via ORAL
  Filled 2024-05-28 (×2): qty 1

## 2024-05-28 MED ORDER — GABAPENTIN 300 MG PO CAPS: 300.0000 mg | ORAL_CAPSULE | Freq: Three times a day (TID) | ORAL | Status: DC

## 2024-05-28 MED ORDER — OLOPATADINE HCL 0.1 % OP SOLN
1.0000 [drp] | Freq: Two times a day (BID) | OPHTHALMIC | Status: DC
  Administered 2024-05-28 – 2024-05-29 (×3): 1 [drp] via OPHTHALMIC
  Filled 2024-05-28: qty 5

## 2024-05-28 MED ORDER — AMLODIPINE BESYLATE 10 MG PO TABS
10.0000 mg | ORAL_TABLET | Freq: Every day | ORAL | Status: DC
  Administered 2024-05-28 – 2024-05-29 (×2): 10 mg via ORAL
  Filled 2024-05-28: qty 2
  Filled 2024-05-28: qty 1

## 2024-05-28 MED ORDER — CHLORTHALIDONE 25 MG PO TABS
25.0000 mg | ORAL_TABLET | Freq: Every day | ORAL | Status: DC
  Administered 2024-05-28: 25 mg via ORAL
  Filled 2024-05-28 (×2): qty 1

## 2024-05-28 MED ORDER — ACETAMINOPHEN 650 MG RE SUPP: 650.0000 mg | Freq: Four times a day (QID) | RECTAL | Status: DC | PRN

## 2024-05-28 MED ORDER — ADULT MULTIVITAMIN W/MINERALS CH
1.0000 | ORAL_TABLET | Freq: Every day | ORAL | Status: DC
  Administered 2024-05-28 – 2024-05-29 (×2): 1 via ORAL
  Filled 2024-05-28 (×2): qty 1

## 2024-05-28 MED ORDER — ATENOLOL-CHLORTHALIDONE 50-25 MG PO TABS: 1.0000 | ORAL_TABLET | Freq: Every day | ORAL | Status: DC

## 2024-05-28 MED ORDER — ACETAMINOPHEN 325 MG PO TABS
650.0000 mg | ORAL_TABLET | Freq: Four times a day (QID) | ORAL | Status: DC | PRN
  Administered 2024-05-28 – 2024-05-29 (×4): 650 mg via ORAL
  Filled 2024-05-28 (×4): qty 2

## 2024-05-28 MED ORDER — LORAZEPAM 1 MG PO TABS
1.0000 mg | ORAL_TABLET | ORAL | Status: DC | PRN
  Administered 2024-05-28 (×2): 2 mg via ORAL
  Administered 2024-05-29: 1 mg via ORAL
  Filled 2024-05-28 (×2): qty 2
  Filled 2024-05-28: qty 1

## 2024-05-28 MED ORDER — FOLIC ACID 1 MG PO TABS
1.0000 mg | ORAL_TABLET | Freq: Every day | ORAL | Status: DC
  Administered 2024-05-28 – 2024-05-29 (×2): 1 mg via ORAL
  Filled 2024-05-28 (×2): qty 1

## 2024-05-28 MED ORDER — FLUTICASONE FUROATE-VILANTEROL 200-25 MCG/ACT IN AEPB: 1.0000 | INHALATION_SPRAY | Freq: Every day | RESPIRATORY_TRACT | Status: DC

## 2024-05-28 MED ORDER — SODIUM CHLORIDE 0.9 % IV SOLN: INTRAVENOUS | Status: DC

## 2024-05-28 MED ORDER — DICYCLOMINE HCL 20 MG PO TABS: 20.0000 mg | ORAL_TABLET | Freq: Two times a day (BID) | ORAL | Status: DC

## 2024-05-28 MED ORDER — ORAL CARE MOUTH RINSE: 15.0000 mL | OROMUCOSAL | Status: DC | PRN

## 2024-05-28 MED ORDER — THIAMINE MONONITRATE 100 MG PO TABS
100.0000 mg | ORAL_TABLET | Freq: Every day | ORAL | Status: DC
  Administered 2024-05-28 – 2024-05-29 (×2): 100 mg via ORAL
  Filled 2024-05-28 (×2): qty 1

## 2024-05-28 MED ORDER — LACTATED RINGERS IV SOLN: INTRAVENOUS | Status: AC

## 2024-05-28 MED ORDER — ALBUTEROL SULFATE (2.5 MG/3ML) 0.083% IN NEBU
2.5000 mg | INHALATION_SOLUTION | RESPIRATORY_TRACT | Status: DC | PRN
  Administered 2024-05-28 – 2024-05-29 (×4): 2.5 mg via RESPIRATORY_TRACT
  Filled 2024-05-28 (×5): qty 3

## 2024-05-28 MED ORDER — ISOSORBIDE MONONITRATE ER 30 MG PO TB24
30.0000 mg | ORAL_TABLET | Freq: Every day | ORAL | Status: DC
  Administered 2024-05-28 – 2024-05-29 (×2): 30 mg via ORAL
  Filled 2024-05-28 (×2): qty 1

## 2024-05-28 MED ORDER — ONDANSETRON HCL 4 MG/2ML IJ SOLN: 4.0000 mg | Freq: Four times a day (QID) | INTRAMUSCULAR | Status: DC | PRN

## 2024-05-28 MED ORDER — PANTOPRAZOLE SODIUM 40 MG PO TBEC: 40.0000 mg | DELAYED_RELEASE_TABLET | Freq: Every day | ORAL | Status: DC

## 2024-05-28 MED ORDER — MONTELUKAST SODIUM 10 MG PO TABS
10.0000 mg | ORAL_TABLET | Freq: Every day | ORAL | Status: DC
  Administered 2024-05-28 – 2024-05-29 (×2): 10 mg via ORAL
  Filled 2024-05-28 (×2): qty 1

## 2024-05-28 MED ORDER — HYDROMORPHONE HCL 1 MG/ML IJ SOLN
1.0000 mg | Freq: Once | INTRAMUSCULAR | Status: AC
  Administered 2024-05-28: 1 mg via INTRAVENOUS
  Filled 2024-05-28: qty 1

## 2024-05-28 MED ORDER — LORAZEPAM 2 MG/ML IJ SOLN: 1.0000 mg | INTRAMUSCULAR | Status: DC | PRN

## 2024-05-28 MED ORDER — ALLOPURINOL 300 MG PO TABS: 300.0000 mg | ORAL_TABLET | Freq: Every day | ORAL | Status: DC | PRN

## 2024-05-28 MED ORDER — LACTATED RINGERS IV SOLN: INTRAVENOUS | Status: DC

## 2024-05-28 MED ORDER — ONDANSETRON HCL 4 MG/2ML IJ SOLN
4.0000 mg | Freq: Once | INTRAMUSCULAR | Status: AC
  Administered 2024-05-28: 4 mg via INTRAVENOUS
  Filled 2024-05-28: qty 2

## 2024-05-28 MED ORDER — MORPHINE SULFATE (PF) 4 MG/ML IV SOLN
4.0000 mg | Freq: Once | INTRAVENOUS | Status: AC
  Administered 2024-05-28: 4 mg via INTRAVENOUS
  Filled 2024-05-28: qty 1

## 2024-05-28 MED ORDER — ATENOLOL 25 MG PO TABS
50.0000 mg | ORAL_TABLET | Freq: Every day | ORAL | Status: DC
  Administered 2024-05-28 – 2024-05-29 (×2): 50 mg via ORAL
  Filled 2024-05-28: qty 1
  Filled 2024-05-28: qty 2

## 2024-05-28 MED ORDER — INSULIN ASPART 100 UNIT/ML IJ SOLN
0.0000 [IU] | Freq: Three times a day (TID) | INTRAMUSCULAR | Status: DC
  Administered 2024-05-28: 3 [IU] via SUBCUTANEOUS
  Administered 2024-05-28: 5 [IU] via SUBCUTANEOUS
  Administered 2024-05-29 (×2): 3 [IU] via SUBCUTANEOUS
  Administered 2024-05-29: 5 [IU] via SUBCUTANEOUS
  Filled 2024-05-28: qty 2
  Filled 2024-05-28: qty 3
  Filled 2024-05-28: qty 5
  Filled 2024-05-28: qty 3
  Filled 2024-05-28: qty 5

## 2024-05-28 MED ORDER — HYDRALAZINE HCL 50 MG PO TABS
50.0000 mg | ORAL_TABLET | Freq: Four times a day (QID) | ORAL | Status: DC
  Administered 2024-05-28 – 2024-05-30 (×7): 50 mg via ORAL
  Filled 2024-05-28 (×7): qty 1

## 2024-05-28 MED ORDER — ONDANSETRON HCL 4 MG PO TABS
4.0000 mg | ORAL_TABLET | Freq: Four times a day (QID) | ORAL | Status: DC | PRN
  Administered 2024-05-29: 4 mg via ORAL
  Filled 2024-05-28: qty 1

## 2024-05-28 MED ORDER — HYDRALAZINE HCL 20 MG/ML IJ SOLN
5.0000 mg | Freq: Four times a day (QID) | INTRAMUSCULAR | Status: DC | PRN
  Administered 2024-05-28: 5 mg via INTRAVENOUS
  Filled 2024-05-28: qty 1

## 2024-05-28 NOTE — ED Provider Notes (Signed)
 [facility] Provider Note   CSN: [id number] Arrival date & time: 05/27/24  2217     Patient presents with: Abdominal Pain   Caleb Hunter is a 58 y.o. male.    Abdominal Pain Pain location:  Generalized Pain radiates to:  Does not radiate Pain severity:  Severe Onset quality:  Gradual Duration: days. Timing:  Constant Progression:  Unchanged Chronicity:  Recurrent Context: alcohol use   Relieved by:  Nothing Worsened by:  Nothing Ineffective treatments:  None tried Associated symptoms: nausea   Associated symptoms: no fever and no vomiting   Patient was seen over a month ago.  PMD called into [facility] wanting patient admitted, (see note in computer).  Patient was at [facility] but left the waiting room there and then went home and then came to the ED at [facility] for the same symptoms.  Patient reports 3 alcoholic drinks per day.       Prior to Admission medications   Medication Sig Start Date End Date Taking? Authorizing Provider  albuterol  (PROVENTIL ) (2.5 MG/3ML) 0.083% nebulizer solution Take 2.5 mg by nebulization every 6 (six) hours as needed for wheezing or shortness of breath.    [provider]  albuterol  (VENTOLIN  HFA) 108 (90 Base) MCG/ACT inhaler Inhale 2 puffs into the lungs every 4 (four) hours as needed for shortness of breath or wheezing.    [provider]  allopurinol  (ZYLOPRIM ) 300 MG tablet Take 300 mg by mouth daily as needed (gout). 12/14/20   [provider]  amLODipine  (NORVASC ) 10 MG tablet Take 10 mg by mouth daily. 10/21/17   [provider]  amoxicillin -clavulanate (AUGMENTIN ) 875-125 MG tablet Take 1 tablet by mouth every 12 (twelve) hours. 04/28/24   Minnie Tinnie BRAVO, PA  atenolol -chlorthalidone  (TENORETIC ) 50-25 MG tablet Take 1 tablet by mouth daily.    [provider]  atorvastatin  (LIPITOR) 80 MG tablet Take 40 mg by mouth at bedtime.    [provider]   budesonide -formoterol  (SYMBICORT ) 80-4.5 MCG/ACT inhaler Inhale 2 puffs into the lungs 2 (two) times daily. 07/27/20   [provider]  clobetasol  cream (TEMOVATE ) 0.05 % Apply 1 application topically 2 (two) times daily as needed (irritation).    [provider]  Colchicine  0.6 MG CAPS Take 1 tablet by mouth daily as needed (gout).    [provider]  colchicine  0.6 MG tablet TAKE 1 TABLET(0.6 MG) BY MOUTH DAILY 03/30/24   Gershon Donnice SAUNDERS, DPM  dicyclomine  (BENTYL ) 20 MG tablet Take 1 tablet (20 mg total) by mouth 2 (two) times daily. 05/07/23   Jerrol Agent, MD  EPINEPHrine  0.3 mg/0.3 mL IJ SOAJ injection Inject 0.3 mg into the muscle as needed for anaphylaxis. 04/26/20   [provider]  gabapentin  (NEURONTIN ) 300 MG capsule Take 1 capsule (300 mg total) by mouth 3 (three) times daily for 7 days. 03/02/22 02/13/24  McDonald, Juliene SAUNDERS, DPM  lidocaine  (XYLOCAINE ) 5 % ointment Apply 1 Application topically as needed. 02/13/24   McDonald, Juliene SAUNDERS, DPM  meloxicam  (MOBIC ) 7.5 MG tablet Take 1 tablet (7.5 mg total) by mouth daily. 01/27/24   Floyd, Dan, DO  montelukast  (SINGULAIR ) 10 MG tablet Take 10 mg by mouth at bedtime. 06/07/15   [provider]  Olopatadine  HCl 0.2 % SOLN Place 1 drop into both eyes daily as needed (eye allergies). 12/25/19   [provider]  omeprazole  (PRILOSEC) 20 MG capsule Take 20 mg by mouth daily. 08/01/20  [provider]  ondansetron  (ZOFRAN -ODT) 4 MG disintegrating tablet Take 1 tablet (4 mg total) by mouth every 8 (eight) hours as needed for nausea or vomiting. 04/28/24   Minnie Tinnie BRAVO, PA  oxyCODONE -acetaminophen  (PERCOCET/ROXICET) 5-325 MG tablet Take 1 tablet by mouth every 6 (six) hours as needed for severe pain (pain score 7-10). 08/07/23   Zelaya, Oscar A, PA-C  potassium chloride  SA (KLOR-CON  M) 20 MEQ tablet Take 2 tablets (40 mEq total) by mouth 2 (two) times daily for 3 days. 05/07/23 02/13/24  Jerrol Agent, MD  predniSONE  (STERAPRED UNI-PAK 21 TAB) 10 MG (21) TBPK tablet 10mg  Tabs, 6 day taper. Use as directed 03/13/24   Roselyn Carlin NOVAK, MD    Allergies: Lisinopril  and Shellfish allergy    Review of Systems  Constitutional:  Negative for fever.  HENT:  Negative for congestion.   Gastrointestinal:  Positive for abdominal pain and nausea. Negative for vomiting.  All other systems reviewed and are negative.   Updated Vital Signs BP (!) 177/91   Pulse (!) 47   Temp 98 F (36.7 C)   Resp 12   SpO2 97%   Physical Exam Vitals and nursing note reviewed.  Constitutional:      General: He is not in acute distress.    Appearance: Normal appearance. He is well-developed. He is not diaphoretic.  HENT:     Head: Normocephalic and atraumatic.     Nose: Nose normal.  Eyes:     Conjunctiva/sclera: Conjunctivae normal.     Pupils: Pupils are equal, round, and reactive to light.  Cardiovascular:     Rate and Rhythm: Normal rate and regular rhythm.  Pulmonary:     Effort: Pulmonary effort is normal.     Breath sounds: Normal breath sounds. No wheezing or rales.  Abdominal:     General: Bowel sounds are normal.     Palpations: Abdomen is soft.     Tenderness: There is no abdominal tenderness. There is no guarding or rebound.  Musculoskeletal:        General: Normal range of motion.     Cervical back: Normal range of motion and neck supple.  Skin:    General: Skin is warm and dry.     Capillary Refill: Capillary refill takes less than 2 seconds.  Neurological:     General: No focal deficit present.     Mental Status: He is alert and oriented to person, place, and time.     Deep Tendon Reflexes: Reflexes normal.  Psychiatric:        Mood and Affect: Mood normal.        Behavior: Behavior normal.     (all labs ordered are listed, but only abnormal results are displayed) Results for orders placed or performed during the hospital encounter of 05/27/24  I-stat chem 8, ED (not at  [facility], [facility] or [facility])   Collection Time: 05/27/24 12:50 PM  Result Value Ref Range   Sodium 132 (L) 135 - 145 mmol/L   Potassium 3.8 3.5 - 5.1 mmol/L   Chloride 95 (L) 98 - 111 mmol/L   BUN 23 (H) 6 - 20 mg/dL   Creatinine, Ser 8.79 0.61 - 1.24 mg/dL   Glucose, Bld 690 (H) 70 - 99 mg/dL   Calcium , Ion 1.03 (L) 1.15 - 1.40 mmol/L   TCO2 28 22 - 32 mmol/L   Hemoglobin 15.6 13.0 - 17.0 g/dL   HCT 53.9 60.9 - 47.9 %  I-Stat CG4 Lactic Acid  Collection Time: 05/27/24 12:50 PM  Result Value Ref Range   Lactic Acid, Venous 2.2 (HH) 0.5 - 1.9 mmol/L   Comment NOTIFIED PHYSICIAN   CBC with Differential   Collection Time: 05/27/24  1:09 PM  Result Value Ref Range   WBC 11.4 (H) 4.0 - 10.5 K/uL   RBC 5.11 4.22 - 5.81 MIL/uL   Hemoglobin 14.5 13.0 - 17.0 g/dL   HCT 57.2 60.9 - 47.9 %   MCV 83.6 80.0 - 100.0 fL   MCH 28.4 26.0 - 34.0 pg   MCHC 34.0 30.0 - 36.0 g/dL   RDW 84.8 88.4 - 84.4 %   Platelets 173 150 - 400 K/uL   nRBC 0.2 0.0 - 0.2 %   Neutrophils Relative % 64 %   Neutro Abs 7.3 1.7 - 7.7 K/uL   Lymphocytes Relative 25 %   Lymphs Abs 2.9 0.7 - 4.0 K/uL   Monocytes Relative 8 %   Monocytes Absolute 0.9 0.1 - 1.0 K/uL   Eosinophils Relative 1 %   Eosinophils Absolute 0.2 0.0 - 0.5 K/uL   Basophils Relative 1 %   Basophils Absolute 0.1 0.0 - 0.1 K/uL   Immature Granulocytes 1 %   Abs Immature Granulocytes 0.12 (H) 0.00 - 0.07 K/uL  Comprehensive metabolic panel   Collection Time: 05/27/24  1:09 PM  Result Value Ref Range   Sodium 132 (L) 135 - 145 mmol/L   Potassium 3.9 3.5 - 5.1 mmol/L   Chloride 91 (L) 98 - 111 mmol/L   CO2 23 22 - 32 mmol/L   Glucose, Bld 296 (H) 70 - 99 mg/dL   BUN 19 6 - 20 mg/dL   Creatinine, Ser 8.76 0.61 - 1.24 mg/dL   Calcium  9.2 8.9 - 10.3 mg/dL   Total Protein 7.8 6.5 - 8.1 g/dL   Albumin 3.6 3.5 - 5.0 g/dL   AST 41 15 - 41 U/L   ALT 54 (H) 0 - 44 U/L   Alkaline Phosphatase 108 38 - 126 U/L   Total Bilirubin 1.2 0.0 - 1.2 mg/dL   GFR,  Estimated >39 >39 mL/min   Anion gap 18 (H) 5 - 15  Lipase, blood   Collection Time: 05/27/24  1:09 PM  Result Value Ref Range   Lipase 630 (H) 11 - 51 U/L   CT ABDOMEN PELVIS W CONTRAST Result Date: 05/27/2024 CLINICAL DATA:  Abdominal pain. EXAM: CT ABDOMEN AND PELVIS WITH CONTRAST TECHNIQUE: Multidetector CT imaging of the abdomen and pelvis was performed using the standard protocol following bolus administration of intravenous contrast. RADIATION DOSE REDUCTION: This exam was performed according to the departmental dose-optimization program which includes automated exposure control, adjustment of the mA and/or kV according to patient size and/or use of iterative reconstruction technique. CONTRAST:  75mL OMNIPAQUE  IOHEXOL  350 MG/ML SOLN COMPARISON:  CT abdomen pelvis dated 04/28/2024. FINDINGS: Lower chest: The visualized lung bases are clear. No intra-abdominal free air or free fluid. Hepatobiliary: Fatty liver. No biliary dilatation. The gallbladder is unremarkable Pancreas: Inflammatory changes predominantly involving the uncinate process of the pancreas consistent with acute pancreatitis. There is also inflammatory changes of the distal body and tail of the pancreas. No abscess or pseudocyst. Spleen: Normal in size without focal abnormality. Adrenals/Urinary Tract: The adrenal glands unremarkable there is no hydronephrosis on either side. The visualized ureters and urinary bladder appear unremarkable Stomach/Bowel: There is no bowel obstruction or active inflammation. The appendix is normal. Vascular/Lymphatic: Mild aortoiliac atherosclerotic disease. The IVC is unremarkable no  portal venous gas. There is no adenopathy. Reproductive: The prostate and seminal vesicles are grossly remarkable. Other: None Musculoskeletal: L3-L4 disc spacer and posterior fusion. No acute osseous pathology. IMPRESSION: 1. Acute pancreatitis. No abscess or pseudocyst. 2. Fatty liver. 3. No bowel obstruction. Normal  appendix. 4.  Aortic Atherosclerosis (ICD10-I70.0). Electronically Signed   By: Vanetta Chou M.D.   On: 05/27/2024 14:23   CT ABDOMEN PELVIS W CONTRAST Result Date: 04/28/2024 CLINICAL DATA:  Generalized abdominal pain EXAM: CT ABDOMEN AND PELVIS WITH CONTRAST TECHNIQUE: Multidetector CT imaging of the abdomen and pelvis was performed using the standard protocol following bolus administration of intravenous contrast. RADIATION DOSE REDUCTION: This exam was performed according to the departmental dose-optimization program which includes automated exposure control, adjustment of the mA and/or kV according to patient size and/or use of iterative reconstruction technique. CONTRAST:  OMNIPAQUE  IOHEXOL  300 MG/ML  SOLN COMPARISON:  05/07/2023 FINDINGS: Lower chest: No acute abnormality. Hepatobiliary: No focal liver abnormality is seen. No gallstones, gallbladder wall thickening, or biliary dilatation. Pancreas: Unremarkable. No pancreatic ductal dilatation or surrounding inflammatory changes. Spleen: Within normal limits as visualized. Adrenals/Urinary Tract: Adrenal glands are within normal limits. Kidneys demonstrate a normal enhancement pattern bilaterally. Normal excretion is seen. No focal mass lesion is noted. The bladder is partially distended. Stomach/Bowel: Scattered diverticular change of the colon is noted without evidence of diverticulitis. The appendix has been surgically removed. Small bowel and stomach are within normal limits with the exception of some Peri duodenal inflammatory change in the second portion of the duodenum. This may represent some duodenitis no evidence of perforation is noted. Vascular/Lymphatic: No significant vascular findings are present. No enlarged abdominal or pelvic lymph nodes. Reproductive: Prostate is unremarkable. Other: No abdominal wall hernia or abnormality. No abdominopelvic ascites. Musculoskeletal: No acute or significant osseous findings. Postsurgical  changes in the lumbar spine are seen. IMPRESSION: Peri duodenal inflammatory change consistent with duodenitis. No evidence of perforation to suggest abscess is seen. Diverticular change without diverticulitis. Electronically Signed   By: Oneil Devonshire M.D.   On: 04/28/2024 22:48     Radiology: CT ABDOMEN PELVIS W CONTRAST Result Date: 05/27/2024 CLINICAL DATA:  Abdominal pain. EXAM: CT ABDOMEN AND PELVIS WITH CONTRAST TECHNIQUE: Multidetector CT imaging of the abdomen and pelvis was performed using the standard protocol following bolus administration of intravenous contrast. RADIATION DOSE REDUCTION: This exam was performed according to the departmental dose-optimization program which includes automated exposure control, adjustment of the mA and/or kV according to patient size and/or use of iterative reconstruction technique. CONTRAST:  75mL OMNIPAQUE  IOHEXOL  350 MG/ML SOLN COMPARISON:  CT abdomen pelvis dated 04/28/2024. FINDINGS: Lower chest: The visualized lung bases are clear. No intra-abdominal free air or free fluid. Hepatobiliary: Fatty liver. No biliary dilatation. The gallbladder is unremarkable Pancreas: Inflammatory changes predominantly involving the uncinate process of the pancreas consistent with acute pancreatitis. There is also inflammatory changes of the distal body and tail of the pancreas. No abscess or pseudocyst. Spleen: Normal in size without focal abnormality. Adrenals/Urinary Tract: The adrenal glands unremarkable there is no hydronephrosis on either side. The visualized ureters and urinary bladder appear unremarkable Stomach/Bowel: There is no bowel obstruction or active inflammation. The appendix is normal. Vascular/Lymphatic: Mild aortoiliac atherosclerotic disease. The IVC is unremarkable no portal venous gas. There is no adenopathy. Reproductive: The prostate and seminal vesicles are grossly remarkable. Other: None Musculoskeletal: L3-L4 disc spacer and posterior fusion. No acute  osseous pathology. IMPRESSION: 1. Acute pancreatitis. No abscess or pseudocyst. 2.  Fatty liver. 3. No bowel obstruction. Normal appendix. 4.  Aortic Atherosclerosis (ICD10-I70.0). Electronically Signed   By: Vanetta Chou M.D.   On: 05/27/2024 14:23     Procedures   Medications Ordered in the ED  lactated ringers  infusion ( Intravenous New Bag/Given 05/28/24 0100)  sodium chloride  0.9 % bolus 500 mL (0 mLs Intravenous Stopped 05/28/24 0013)  fentaNYL  (SUBLIMAZE ) injection 50 mcg (50 mcg Intravenous Given 05/27/24 2334)  trimethobenzamide (TIGAN) injection 200 mg (200 mg Intramuscular Given 05/27/24 2337)  morphine  (PF) 4 MG/ML injection 4 mg (4 mg Intravenous Given 05/28/24 0017)                                    Medical Decision Making Abdominal pain and nausea   Amount and/or Complexity of Data Reviewed Independent Historian: spouse    Details: See above  External Data Reviewed: notes.    Details: Previous ED visit reviewed  Labs: ordered.    Details: Elevated creatinine 1.2, elevated glucose 309 elevated lipase 630 white count elevated 11.4 normal hemoglobin 14.5, normal platelets  Radiology: independent interpretation performed.    Details: Pancreatitis on CT  Risk Prescription drug management. Risk Details: I discussed the case with Dr. Franky who admitted the patient to step down.  I was informed the patient signed out AMA and left.    Final diagnoses:  Alcohol-induced acute pancreatitis, unspecified complication status   Patient was admitted but then left AMA, he left the building before EDP could talk to patient  ED Discharge Orders     None          Ellyse Rotolo, MD 05/28/24 9870

## 2024-05-28 NOTE — ED Notes (Signed)
 EDP Palumbo notified of pt's intentions as well.  Pt IV removed, vitals updated, again reiterated risks of leaving AMA.  Pt signed AMA form.  Pt left dept ambulatory and in NAD with spouse.

## 2024-05-28 NOTE — ED Notes (Signed)
 Pt is resting quietly in the bed with eyes closed. Pt opens eyes when nurse enters room and states his pain is 10/10 and requests more pain medication. Dr. Nettie notified

## 2024-05-28 NOTE — Plan of Care (Signed)

## 2024-05-28 NOTE — ED Triage Notes (Addendum)
 Patient was recently seen at [facility] and was offered to be admitted for pancreatitis and patient refused because he didn't know if we would have a room for him. Patient went home and he is now unable to deal with the abdominal pain.

## 2024-05-28 NOTE — ED Notes (Signed)
 Went into room to introduce self to patient and hook up IV after pt returned from restroom. Pt wishes to leave AMA and go to [facility] in the morning.  Advised pt this will be a new process, new visit, and he will essentially be starting his care over as a new visit/eval.  Pt understands and elects to leave either way.  Admitting MD, Dr. Franky advised.
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
## 2024-05-28 NOTE — ED Provider Notes (Signed)
 [place] EMERGENCY DEPARTMENT AT [facility] Provider Note   CSN: [id number] Arrival date & time: 05/28/24  9582     Patient presents with: Abdominal Pain   Caleb Hunter is a 58 y.o. male.   The history is provided by the patient.  Patient presented for reevaluation of abdominal pain.  He was just seen in the ER and diagnosed with acute pancreatitis and advised admission.  However due to wait times he left and went home, but his pain has been getting worse.  It is in his upper abdomen and does not radiate.  He reports 1 isolated episode of nonbloody emesis.  He has had normal bowel movements without any blood. Patient did admit to alcohol use    Past Medical History:  Diagnosis Date   Anxiety    Arthritis    back   Chronic pain    Pain management   Depression    Dyspnea    per pt occasional sob w/ stairs and long distance due to asthma   GERD (gastroesophageal reflux disease)    Gout    History of stomach ulcers    per pt approx. 2018   Hyperlipidemia    Hypertension    Moderate asthma    followed by pcp--   (07-25-2020 per pt does use dulera  inhaler as was prescribed by pcp because he does not have the prescribtion ,  stated used nebulizer last night for wheezing and agaion today)   Pre-diabetes     Prior to Admission medications   Medication Sig Start Date End Date Taking? Authorizing Provider  albuterol  (PROVENTIL ) (2.5 MG/3ML) 0.083% nebulizer solution Take 2.5 mg by nebulization every 6 (six) hours as needed for wheezing or shortness of breath.    [provider]  albuterol  (VENTOLIN  HFA) 108 (90 Base) MCG/ACT inhaler Inhale 2 puffs into the lungs every 4 (four) hours as needed for shortness of breath or wheezing.    [provider]  allopurinol  (ZYLOPRIM ) 300 MG tablet Take 300 mg by mouth daily as needed (gout). 12/14/20   [provider]  amLODipine  (NORVASC ) 10 MG tablet Take 10 mg by mouth daily. 10/21/17   [provider]  atenolol -chlorthalidone  (TENORETIC ) 50-25 MG tablet Take 1 tablet by mouth daily.    [provider]  atorvastatin  (LIPITOR) 80 MG tablet Take 40 mg by mouth at bedtime.    [provider]  budesonide -formoterol  (SYMBICORT ) 80-4.5 MCG/ACT inhaler Inhale 2 puffs into the lungs 2 (two) times daily. 07/27/20   [provider]  clobetasol  cream (TEMOVATE ) 0.05 % Apply 1 application topically 2 (two) times daily as needed (irritation).    [provider]  Colchicine  0.6 MG CAPS Take 1 tablet by mouth daily as needed (gout).    [provider]  colchicine  0.6 MG tablet TAKE 1 TABLET(0.6 MG) BY MOUTH DAILY 03/30/24   Gershon Donnice SAUNDERS, DPM  dicyclomine  (BENTYL ) 20 MG tablet Take 1 tablet (20 mg total) by mouth 2 (two) times daily. 05/07/23   Jerrol Agent, MD  EPINEPHrine  0.3 mg/0.3 mL IJ SOAJ injection Inject 0.3 mg into the muscle as needed for anaphylaxis. 04/26/20   [provider]  gabapentin  (NEURONTIN ) 300 MG capsule Take 1 capsule (300 mg total) by mouth 3 (three) times daily for 7 days. 03/02/22 02/13/24  McDonald, Juliene SAUNDERS, DPM  lidocaine  (XYLOCAINE ) 5 % ointment Apply 1 Application topically as needed. 02/13/24   McDonald, Juliene SAUNDERS, DPM  montelukast  (SINGULAIR ) 10 MG tablet  Take 10 mg by mouth at bedtime. 06/07/15   [provider]  Olopatadine  HCl 0.2 % SOLN Place 1 drop into both eyes daily as needed (eye allergies). 12/25/19   [provider]  omeprazole  (PRILOSEC) 20 MG capsule Take 20 mg by mouth daily. 08/01/20   [provider]    Allergies: Lisinopril  and Shellfish allergy    Review of Systems  Constitutional:  Negative for fever.  Cardiovascular:  Negative for chest pain.  Gastrointestinal:  Positive for abdominal pain, nausea and vomiting. Negative for blood in stool, constipation and diarrhea.  Musculoskeletal:  Negative for back pain.    Updated Vital Signs BP (!) 174/100   Pulse (!) 52    Temp 99 F (37.2 C) (Oral)   Resp 19   SpO2 97%   Physical Exam CONSTITUTIONAL: Well developed/well nourished, uncomfortable appearing HEAD: Normocephalic/atraumatic EYES: EOMI/PERRL ENMT: Mucous membranes moist NECK: supple no meningeal signs CV: S1/S2 noted, no murmurs/rubs/gallops noted LUNGS: Lungs are clear to auscultation bilaterally, no apparent distress ABDOMEN: soft, obese, significant epigastric abdominal tenderness, but no hernia identified Well-healed midline scar noted NEURO: Pt is awake/alert/appropriate, moves all extremitiesx4.  No facial droop.   EXTREMITIES: pulses normal/equal, full ROM SKIN: warm, color normal  (all labs ordered are listed, but only abnormal results are displayed) Labs Reviewed  CBC WITH DIFFERENTIAL/PLATELET - Abnormal; Notable for the following components:      Result Value   WBC 10.9 (*)    Neutro Abs 8.2 (*)    Abs Immature Granulocytes 0.12 (*)    All other components within normal limits  COMPREHENSIVE METABOLIC PANEL WITH GFR - Abnormal; Notable for the following components:   Sodium 132 (*)    Chloride 93 (*)    Glucose, Bld 248 (*)    BUN 24 (*)    AST 50 (*)    ALT 51 (*)    All other components within normal limits  LIPASE, BLOOD - Abnormal; Notable for the following components:   Lipase 1,015 (*)    All other components within normal limits    EKG: EKG Interpretation Date/Time:  Thursday May 28 2024 04:51:54 EST Ventricular Rate:  50 PR Interval:  161 QRS Duration:  100 QT Interval:  505 QTC Calculation: 461 R Axis:   -11  Text Interpretation: Sinus rhythm Probable left atrial enlargement Abnormal R-wave progression, early transition Left ventricular hypertrophy Interpretation limited secondary to artifact No significant change since last tracing Confirmed by Midge Golas ([id number]) on 05/28/2024 5:29:13 AM  Radiology: CT ABDOMEN PELVIS W CONTRAST Result Date: 05/27/2024 CLINICAL DATA:  Abdominal pain. EXAM:  CT ABDOMEN AND PELVIS WITH CONTRAST TECHNIQUE: Multidetector CT imaging of the abdomen and pelvis was performed using the standard protocol following bolus administration of intravenous contrast. RADIATION DOSE REDUCTION: This exam was performed according to the departmental dose-optimization program which includes automated exposure control, adjustment of the mA and/or kV according to patient size and/or use of iterative reconstruction technique. CONTRAST:  75mL OMNIPAQUE  IOHEXOL  350 MG/ML SOLN COMPARISON:  CT abdomen pelvis dated 04/28/2024. FINDINGS: Lower chest: The visualized lung bases are clear. No intra-abdominal free air or free fluid. Hepatobiliary: Fatty liver. No biliary dilatation. The gallbladder is unremarkable Pancreas: Inflammatory changes predominantly involving the uncinate process of the pancreas consistent with acute pancreatitis. There is also inflammatory changes of the distal body and tail of the pancreas. No abscess or pseudocyst. Spleen: Normal in size without focal abnormality. Adrenals/Urinary Tract: The adrenal glands unremarkable there is no hydronephrosis  on either side. The visualized ureters and urinary bladder appear unremarkable Stomach/Bowel: There is no bowel obstruction or active inflammation. The appendix is normal. Vascular/Lymphatic: Mild aortoiliac atherosclerotic disease. The IVC is unremarkable no portal venous gas. There is no adenopathy. Reproductive: The prostate and seminal vesicles are grossly remarkable. Other: None Musculoskeletal: L3-L4 disc spacer and posterior fusion. No acute osseous pathology. IMPRESSION: 1. Acute pancreatitis. No abscess or pseudocyst. 2. Fatty liver. 3. No bowel obstruction. Normal appendix. 4.  Aortic Atherosclerosis (ICD10-I70.0). Electronically Signed   By: Vanetta Chou M.D.   On: 05/27/2024 14:23     Procedures   Medications Ordered in the ED  lactated ringers  infusion (has no administration in time range)  oxyCODONE  (Oxy  IR/ROXICODONE ) immediate release tablet 5 mg (has no administration in time range)  HYDROmorphone  (DILAUDID ) injection 0.5 mg (has no administration in time range)  HYDROmorphone  (DILAUDID ) injection 1 mg (1 mg Intravenous Given 05/28/24 0620)  ondansetron  (ZOFRAN ) injection 4 mg (4 mg Intravenous Given 05/28/24 0619)    Clinical Course as of 05/28/24 0648  Thu May 28, 2024  0648 Patient presents with continued abdominal pain with recent diagnosis of alcohol induced pancreatitis. Patient appears uncomfortable with focal abdominal tenderness.  CT imaging was performed yesterday that revealed pancreatitis without any complicating features.  Labs today reveal worsening lipase [DW]  [phone] Discussed with Dr. Debby for admission [DW]    Clinical Course User Index [DW] Midge Golas, MD                                 Medical Decision Making Amount and/or Complexity of Data Reviewed Labs: ordered. ECG/medicine tests: ordered.  Risk Prescription drug management. Decision regarding hospitalization.   This patient presents to the ED for concern of abdominal pain, this involves an extensive number of treatment options, and is a complaint that carries with it a high risk of complications and morbidity.  The differential diagnosis includes but is not limited to cholecystitis, cholelithiasis, pancreatitis, gastritis, peptic ulcer disease, appendicitis, bowel obstruction, bowel perforation, diverticulitis, AAA, ischemic bowel   Comorbidities that complicate the patient evaluation: Patient's presentation is complicated by their history of hypertension  Social Determinants of Health: Patient's alcohol use  increases the complexity of managing their presentation  Additional history obtained: Records reviewed outpatient records reviewed  Lab Tests: I Ordered, and personally interpreted labs.  The pertinent results include: Mild leukocytosis  Cardiac Monitoring: The patient was maintained on  a cardiac monitor.  I personally viewed and interpreted the cardiac monitor which showed an underlying rhythm of:  sinus rhythm  Medicines ordered and prescription drug management: I ordered medication including Dilaudid  for pain Reevaluation of the patient after these medicines showed that the patient    improved   Critical Interventions:   admission for pain management  Consultations Obtained: I requested consultation with the admitting physician [facility], and discussed  findings as well as pertinent plan - they recommend: admit  Reevaluation: After the interventions noted above, I reevaluated the patient and found that they have :improved  Complexity of problems addressed: Patient's presentation is most consistent with  acute presentation with potential threat to life or bodily function  Disposition: After consideration of the diagnostic results and the patient's response to treatment,  I feel that the patent would benefit from admission  .        Final diagnoses:  Acute pancreatitis without infection or necrosis, unspecified pancreatitis type  ED Discharge Orders     None          Midge Golas, MD 05/28/24 [phone]

## 2024-05-29 ENCOUNTER — Inpatient Hospital Stay (HOSPITAL_COMMUNITY)

## 2024-05-29 DIAGNOSIS — F1090 Alcohol use, unspecified, uncomplicated: Secondary | ICD-10-CM | POA: Diagnosis present

## 2024-05-29 DIAGNOSIS — I10 Essential (primary) hypertension: Secondary | ICD-10-CM | POA: Diagnosis present

## 2024-05-29 DIAGNOSIS — M109 Gout, unspecified: Secondary | ICD-10-CM | POA: Diagnosis present

## 2024-05-29 DIAGNOSIS — J4540 Moderate persistent asthma, uncomplicated: Secondary | ICD-10-CM | POA: Diagnosis present

## 2024-05-29 DIAGNOSIS — K852 Alcohol induced acute pancreatitis without necrosis or infection: Secondary | ICD-10-CM | POA: Diagnosis not present

## 2024-05-29 DIAGNOSIS — E785 Hyperlipidemia, unspecified: Secondary | ICD-10-CM | POA: Insufficient documentation

## 2024-05-29 DIAGNOSIS — E66811 Obesity, class 1: Secondary | ICD-10-CM

## 2024-05-29 DIAGNOSIS — E119 Type 2 diabetes mellitus without complications: Secondary | ICD-10-CM

## 2024-05-29 DIAGNOSIS — R509 Fever, unspecified: Secondary | ICD-10-CM | POA: Insufficient documentation

## 2024-05-29 LAB — LIPASE, BLOOD: Lipase: 255 U/L — ABNORMAL HIGH (ref 11–51)

## 2024-05-29 LAB — CBC
HCT: 39.7 % (ref 39.0–52.0)
Hemoglobin: 13.2 g/dL (ref 13.0–17.0)
MCH: 28.6 pg (ref 26.0–34.0)
MCHC: 33.2 g/dL (ref 30.0–36.0)
MCV: 86.1 fL (ref 80.0–100.0)
Platelets: 142 K/uL — ABNORMAL LOW (ref 150–400)
RBC: 4.61 MIL/uL (ref 4.22–5.81)
RDW: 15.5 % (ref 11.5–15.5)
WBC: 17.2 K/uL — ABNORMAL HIGH (ref 4.0–10.5)
nRBC: 0.1 % (ref 0.0–0.2)

## 2024-05-29 LAB — URINALYSIS, ROUTINE W REFLEX MICROSCOPIC
Bacteria, UA: NONE SEEN
Bilirubin Urine: NEGATIVE
Glucose, UA: NEGATIVE mg/dL
Ketones, ur: NEGATIVE mg/dL
Leukocytes,Ua: NEGATIVE
Nitrite: NEGATIVE
Protein, ur: 30 mg/dL — AB
Specific Gravity, Urine: 1.025 (ref 1.005–1.030)
pH: 5 (ref 5.0–8.0)

## 2024-05-29 LAB — GLUCOSE, CAPILLARY
Glucose-Capillary: 207 mg/dL — ABNORMAL HIGH (ref 70–99)
Glucose-Capillary: 184 mg/dL — ABNORMAL HIGH (ref 70–99)
Glucose-Capillary: 190 mg/dL — ABNORMAL HIGH (ref 70–99)
Glucose-Capillary: 186 mg/dL — ABNORMAL HIGH (ref 70–99)

## 2024-05-29 LAB — BASIC METABOLIC PANEL WITH GFR
Anion gap: 11 (ref 5–15)
BUN: 19 mg/dL (ref 6–20)
CO2: 28 mmol/L (ref 22–32)
Calcium: 9.1 mg/dL (ref 8.9–10.3)
Chloride: 92 mmol/L — ABNORMAL LOW (ref 98–111)
Creatinine, Ser: 1.11 mg/dL (ref 0.61–1.24)
GFR, Estimated: >60 mL/min (ref >60)
Glucose, Bld: 213 mg/dL — ABNORMAL HIGH (ref 70–99)
Potassium: 3.7 mmol/L (ref 3.5–5.1)
Sodium: 131 mmol/L — ABNORMAL LOW (ref 135–145)

## 2024-05-29 LAB — LACTIC ACID, PLASMA
Lactic Acid, Venous: 1.4 mmol/L (ref 0.5–1.9)
Lactic Acid, Venous: 1.4 mmol/L (ref 0.5–1.9)

## 2024-05-29 MED ORDER — MORPHINE SULFATE (PF) 2 MG/ML IV SOLN: 2.0000 mg | INTRAVENOUS | Status: DC | PRN

## 2024-05-29 MED ORDER — LIVING WELL WITH DIABETES BOOK
Freq: Once | Status: AC
  Filled 2024-05-29: qty 1

## 2024-05-29 MED ORDER — LACTATED RINGERS IV SOLN: INTRAVENOUS | Status: DC

## 2024-05-29 MED ORDER — ALBUTEROL SULFATE (2.5 MG/3ML) 0.083% IN NEBU
2.5000 mg | INHALATION_SOLUTION | Freq: Four times a day (QID) | RESPIRATORY_TRACT | Status: DC
  Administered 2024-05-29 – 2024-05-30 (×4): 2.5 mg via RESPIRATORY_TRACT
  Filled 2024-05-29 (×4): qty 3

## 2024-05-29 NOTE — TOC Initial Note (Addendum)
 Transition of Care [facility]) - Initial/Assessment Note    Patient Details  Name: Caleb Hunter MRN: [id number] Date of Birth: August 11, 1965  [facility] CM/SW Contact:    Sonda Manuella Quill, RN Phone Number: 05/29/2024, 10:05 AM  Clinical Narrative:                 IP CM consulted for SA counseling/education; pt asleep; spoke w/ spouse [name] [phone]) at bedside; she says pt lives at home; she plans for him to return at d/c w/ her support; [name] will provide transportation; insurance/PCP verified; she denied pt experiencing SDOH risks; pt has nebulizer; he does not have HH services or home oxygen; [name] agreed to receive resources for SA; she verbalized understanding that they will make appt w/ agency of choice; resources placed in d/c instructions; copy of resources also given to [name]; IP CM following.  Expected Discharge Plan: Home/Self Care Barriers to Discharge: Continued Medical Work up   Patient Goals and CMS Choice Patient states their goals for this hospitalization and ongoing recovery are:: home          Expected Discharge Plan and Services   Discharge Planning Services: CM Consult   Living arrangements for the past 2 months: Single Family Home                 DME Arranged: N/A DME Agency: NA       HH Arranged: NA HH Agency: NA        Prior Living Arrangements/Services Living arrangements for the past 2 months: Single Family Home Lives with:: Spouse Patient language and need for interpreter reviewed:: Yes Do you feel safe going back to the place where you live?: Yes      Need for Family Participation in Patient Care: Yes (Comment) Care giver support system in place?: Yes (comment) Current home services: DME (nebulizer) Criminal Activity/Legal Involvement Pertinent to Current Situation/Hospitalization: No - Comment as needed  Activities of Daily Living   ADL Screening (condition at time of  admission) Independently performs ADLs?: Yes (appropriate for developmental age) Is the patient deaf or have difficulty hearing?: No Does the patient have difficulty seeing, even when wearing glasses/contacts?: No Does the patient have difficulty concentrating, remembering, or making decisions?: No  Permission Sought/Granted Permission sought to share information with : Case Manager Permission granted to share information with : Yes, Verbal Permission Granted  Share Information with NAME: Case Manager     Permission granted to share info w Relationship: [name] (spouse) [phone]     Emotional Assessment Appearance:: Appears stated age Attitude/Demeanor/Rapport: Unable to Assess Affect (typically observed): Unable to Assess Orientation: :  (unable to assess) Alcohol / Substance Use: Alcohol Use Psych Involvement: No (comment)  Admission diagnosis:  Acute pancreatitis [K85.90] Pancreatitis [K85.90] Acute pancreatitis without infection or necrosis, unspecified pancreatitis type [K85.90] Patient Active Problem List   Diagnosis Date Noted   Acute alcoholic pancreatitis 05/29/2024   Alcohol use disorder 05/29/2024   Class 1 obesity due to excess calories with body mass index (BMI) of 32.0 to 32.9 in adult 05/29/2024   Essential hypertension 05/29/2024   Gout 05/29/2024   Hyperglycemia 05/29/2024   Intermittent asthma 05/29/2024   Acute pancreatitis 05/28/2024   Pancreatitis 05/28/2024   Arthritis of first metatarsophalangeal (MTP) joint of left foot    Plantar flexed metatarsal bone of left foot    Spinal stenosis of lumbar region 05/31/2021   Hallux valgus with bunions of left foot  Acquired metatarsus adductus of left foot    Acquired pes planus, left    Hammertoe of left foot    Mallet toe of left foot    Acquired hallux interphalangeus, left    Right wrist injury, initial encounter 01/19/2018   Chronic neck and back pain 08/29/2015   Degenerative disc  disease, cervical 08/29/2015   Lumbar degenerative disc disease 08/29/2015   PCP:  Catalina Bare, MD Pharmacy:   [facility] [phone] - [place], [place] - [address] AT [facility] [address] [place] [address] Phone: [phone] Fax: [phone]  [facility] - [facility] [address], [address] [place] [address] Phone: [phone] Fax: [phone]     Social Drivers of Health (SDOH) Social History: SDOH Screenings   Food Insecurity: No Food Insecurity (05/29/2024)  Housing: Low Risk  (05/29/2024)  Transportation Needs: No Transportation Needs (05/29/2024)  Utilities: Not At Risk (05/29/2024)  Social Connections: Unknown (11/21/2021)   Received from [facility]  Tobacco Use: Low Risk  (05/28/2024)   SDOH Interventions: Food Insecurity Interventions: Intervention Not Indicated, Inpatient TOC Housing Interventions: Intervention Not Indicated, Inpatient TOC Transportation Interventions: Intervention Not Indicated, Inpatient TOC Utilities Interventions: Intervention Not Indicated, Inpatient TOC   Readmission Risk Interventions     No data to display

## 2024-05-29 NOTE — Progress Notes (Signed)
   05/29/24 0832  Assess: MEWS Score  Temp (!) 100.5 F (38.1 C)  BP 124/84  MAP (mmHg) 96  Pulse Rate (!) 120  Resp 19  O2 Device Room Air  Assess: MEWS Score  MEWS Temp 1  MEWS Systolic 0  MEWS Pulse 2  MEWS RR 0  MEWS LOC 0  MEWS Score 3  MEWS Score Color Yellow  Assess: if the MEWS score is Yellow or Red  Were vital signs accurate and taken at a resting state? Yes  Does the patient meet 2 or more of the SIRS criteria? No  MEWS guidelines implemented  Yes, yellow  Treat  MEWS Interventions Considered administering scheduled or prn medications/treatments as ordered  Take Vital Signs  Increase Vital Sign Frequency  Yellow: Q2hr x1, continue Q4hrs until patient remains green for 12hrs  Escalate  MEWS: Escalate Yellow: Discuss with charge nurse and consider notifying provider and/or RRT  Notify: Charge Nurse/RN  Name of Charge Nurse/RN Notified [facility]  Provider Notification  Provider Name/Title Barbarann Nest  Date Provider Notified 05/29/24  Time Provider Notified 0845  Method of Notification Page  Notification Reason Change in status  Provider response See new orders  Date of Provider Response 05/29/24  Time of Provider Response 0900  Assess: SIRS CRITERIA  SIRS Temperature  0  SIRS Respirations  0  SIRS Pulse 1  SIRS WBC 1  SIRS Score Sum  2

## 2024-05-29 NOTE — Hospital Course (Signed)
 58yo with h/o ETOH use d/o (significantly worse in the last few months following the death of his mother), HTN, HLD, gout, anxiety/depression, and moderate persistent asthma who presented on 11/20 with abdominal pain.  He drinks about 3/4 of a fifth daily.  He presented to the ER for this issue on 11/20 just after midnight and left AMA and then returned a few hours later.  Elevated lipase, CT with acute pancreatitis.  Admitted for supportive care, has developed ETOH withdrawal.

## 2024-05-29 NOTE — Assessment & Plan Note (Addendum)
 A1c 8.5 He was actually given Metformin and glimepiride in 01/2024 but did not take metformin due to side effect profile Moderate-scale SSI for now DM coordinator consulted Needs to resume glimepiride at time of dc

## 2024-05-29 NOTE — Assessment & Plan Note (Addendum)
 Symptomatic management Aggressive IV hydration NPO -> full liquids, appears to be tolerating Pain control Appears to be improving

## 2024-05-29 NOTE — Progress Notes (Signed)
 Progress Note   Patient: Caleb Hunter FMW:[id number] DOB: 1966/05/17 DOA: 05/28/2024     1 DOS: the patient was seen and examined on 05/29/2024   Brief hospital course: 58yo with h/o ETOH use d/o (significantly worse in the last few months following the death of his mother), HTN, HLD, gout, anxiety/depression, and moderate persistent asthma who presented on 11/20 with abdominal pain.  He drinks about 3/4 of a fifth daily.  He presented to the ER for this issue on 11/20 just after midnight and left AMA and then returned a few hours later.  Elevated lipase, CT with acute pancreatitis.  Admitted for supportive care, has developed ETOH withdrawal.    Assessment & Plan Acute alcoholic pancreatitis Symptomatic management Aggressive IV hydration NPO -> full liquids, appears to be tolerating Pain control Appears to be improving Alcohol use disorder On CIWA protocol with as needed benzos Having more significant withdrawal symptoms now and threatening to leave AMA Continue to monitor Discussed with his wife about Alanon, stopping drinking altogether Fever Uptrending temp today, currently 103 Possibly related to withdrawal, although this is a high fever for this More likely related to pancreatitis; CT without abscess or pseudocyst on 11/19 Treating with Tylenol , ice packs Unremarkable UA on presentation Will order CXR Will order blood cultures and lactic acid Essential hypertension Resume home BP meds including atenolol , amlodipine   Hold chlorthalidone  Gout No acute concern Continue allopurinol  and colchicine   New onset type 2 diabetes mellitus (HCC) A1c 8.5 He was actually given Metformin and glimepiride in 01/2024 but did not take metformin due to side effect profile Moderate-scale SSI for now DM coordinator consulted Needs to resume glimepiride at time of dc Moderate persistent asthma No longer taking Symbicort  Continue Singulair  Continue as needed albuterol    Dyslipidemia Continue atorvastatin  Class 1 obesity due to excess calories with body mass index (BMI) of 32.0 to 32.9 in adult Body mass index is 32.87 kg/m.SABRA  Weight loss should be encouraged Outpatient PCP/bariatric medicine f/u encouraged Significantly low or high BMI is associated with higher medical risk including morbidity and mortality       Consultants: None  Procedures: None  Antibiotics: None  30 Day Unplanned Readmission Risk Score    Flowsheet Row ED to Hosp-Admission (Current) from 05/28/2024 in [facility]  30 Day Unplanned Readmission Risk Score (%) 14.53 Filed at 05/29/2024 0801    This score is the patient's risk of an unplanned readmission within 30 days of being discharged (0 -100%). The score is based on dignosis, age, lab data, medications, orders, and past utilization.   Low:  0-14.9   Medium: 15-21.9   High: 22-29.9   Extreme: 30 and above           Subjective: Somnolent this AM.  Wife reports that he awakens and complains of abdominal pain, agitation, anxiety and then gets BZD and gets calm again.  She is concerned that he will leave AMA and does not want that.   Objective: Vitals:   05/29/24 1558 05/29/24 1559  BP: (!) 146/83 (!) 146/83  Pulse:  (!) 105  Resp:    Temp: (!) 103 F (39.4 C) (!) 103 F (39.4 C)  SpO2:      Intake/Output Summary (Last 24 hours) at 05/29/2024 1647 Last data filed at 05/29/2024 0900 Gross per 24 hour  Intake 2608.15 ml  Output 600 ml  Net 2008.15 ml   Filed Weights   05/28/24 1351  Weight: 122.5 kg  Exam:  General:  Appears calm and comfortable and is in NAD Eyes:  normal lids, iris ENT:  grossly normal hearing, lips & tongue, mmm Cardiovascular:  RRR. No LE edema.  Respiratory:   CTA bilaterally with no wheezes/rales/rhonchi.  Normal respiratory effort. Abdomen:  soft, NT, ND; awakened from sleep to deep palpation but did not seem bothered by this Skin:   no rash or induration seen on limited exam Musculoskeletal:  grossly normal tone BUE/BLE, good ROM, no bony abnormality Psychiatric: somnolent mood and affect, speech limited due to sedation Neurologic:  unable to effectively perform  Data Reviewed: I have reviewed the patient's lab results since admission.  Pertinent labs for today include:   Na++ 131 Glucose 213 Lipase 255, down from 1015 WBC 17.2 Platelets 142     Family Communication: Wife was present     Code Status: Full Code  Disposition: Status is: Inpatient Remains inpatient appropriate because: ongoing management     Time spent: 50 minutes  Unresulted Labs (From admission, onward)     Start     Ordered   05/30/24 0500  CBC with Differential/Platelet  Tomorrow morning,   R        05/29/24 1647   05/30/24 0500  Basic metabolic panel with GFR  Tomorrow morning,   R        05/29/24 1647   05/29/24 1639  Lactic acid, plasma  (Lactic Acid)  STAT Now then every 3 hours,   R      05/29/24 1638   05/29/24 1638  Culture, blood (Routine X 2) w Reflex to ID Panel  BLOOD CULTURE X 2,   R      05/29/24 1638   05/28/24 0817  IgG 1, 2, 3, and 4  Once,   R        05/28/24 9183             Author: Delon Herald, MD 05/29/2024 4:47 PM  For on call review [email or web address].

## 2024-05-29 NOTE — Inpatient Diabetes Management (Signed)
 Inpatient Diabetes Program Recommendations  AACE/ADA: New Consensus Statement on Inpatient Glycemic Control (2015)  Target Ranges:  Prepandial:   less than 140 mg/dL      Peak postprandial:   less than 180 mg/dL (1-2 hours)      Critically ill patients:  140 - 180 mg/dL   Lab Results  Component Value Date   GLUCAP 207 (H) 05/29/2024   HGBA1C 8.5 (H) 05/28/2024    Review of Glycemic Control  Latest Reference Range & Units 05/28/24 13:00 05/28/24 16:59 05/28/24 21:20 05/29/24 07:52  Glucose-Capillary 70 - 99 mg/dL 797 (H) 799 (H) 782 (H) 207 (H)  (H): Data is abnormally high  Diabetes history: DM2 Outpatient Diabetes medications: Glipizide 5 mg QD Current orders for Inpatient glycemic control: Novolog  0-15 units TID  Inpatient Diabetes Program Recommendations:    If he remains inpatient, might consider:  Semglee 10 every day  Met with patient and spouse at bedside.  Patient is not interested in education so I mostly spoke with spouse.  Chart reviewed.  He was prescribed Amaryl/Glimepiride and Metformin in July of 2025 by his PCP.  He does not take Metformin because he read about side effects.  Spoke with them about diabetes. Discussed A1C results with them and explained what an A1C is, basic pathophysiology of DM Type 2, basic home care, basic diabetes diet nutrition principles, importance of checking CBGs and maintaining good CBG control to prevent long-term and short-term complications. Reviewed signs and symptoms of hyperglycemia and hypoglycemia and how to treat hypoglycemia at home. Also reviewed blood sugar goals at home.  RNs to provide ongoing basic DM education at bedside with this patient. Have ordered educational booklet.  Educated on The Plate Method, CHO's, portion control, avoiding caloric beverages, CBGs at home fasting and mid afternoon, F/U with PCP every 3 months, bring meter to PCP office, long and short term complications of uncontrolled BG, and importance of  exercise.  Discussed hyper and hypoglycemia, signs, symptoms and treatments.    Discharge Recommendations: Other recommendations: Amaryl 4 mg QAM Supply/Referral recommendations: Glucometer Test strips Lancet device Lancets   Use Adult Diabetes Insulin  Treatment Post Discharge order set.  Thank you, Wyvonna Pinal, MSN, CDCES Diabetes Coordinator Inpatient Diabetes Program [phone] (team pager from 8a-5p)

## 2024-05-29 NOTE — Assessment & Plan Note (Addendum)
 On CIWA protocol with as needed benzos Having more significant withdrawal symptoms now and threatening to leave AMA Continue to monitor Discussed with his wife about Alanon, stopping drinking altogether

## 2024-05-29 NOTE — Assessment & Plan Note (Signed)
 Continue atorvastatin 

## 2024-05-29 NOTE — Assessment & Plan Note (Addendum)
 Body mass index is 32.87 kg/m.SABRA  Weight loss should be encouraged Outpatient PCP/bariatric medicine f/u encouraged Significantly low or high BMI is associated with higher medical risk including morbidity and mortality

## 2024-05-29 NOTE — Plan of Care (Signed)
  Problem: Education: Goal: Ability to describe self-care measures that may prevent or decrease complications (Diabetes Survival Skills Education) will improve Outcome: Progressing   Problem: Coping: Goal: Ability to adjust to condition or change in health will improve Outcome: Progressing   Problem: Metabolic: Goal: Ability to maintain appropriate glucose levels will improve Outcome: Progressing   Problem: Nutritional: Goal: Maintenance of adequate nutrition will improve Outcome: Progressing   Problem: Tissue Perfusion: Goal: Adequacy of tissue perfusion will improve Outcome: Progressing   Problem: Clinical Measurements: Goal: Ability to maintain clinical measurements within normal limits will improve Outcome: Progressing Goal: Diagnostic test results will improve Outcome: Progressing   Problem: Coping: Goal: Level of anxiety will decrease Outcome: Progressing   Problem: Elimination: Goal: Will not experience complications related to bowel motility Outcome: Progressing Goal: Will not experience complications related to urinary retention Outcome: Progressing   Problem: Pain Managment: Goal: General experience of comfort will improve and/or be controlled Outcome: Progressing   Problem: Safety: Goal: Ability to remain free from injury will improve Outcome: Progressing

## 2024-05-29 NOTE — Assessment & Plan Note (Addendum)
 No longer taking Symbicort  Continue Singulair  Continue as needed albuterol 

## 2024-05-29 NOTE — Plan of Care (Signed)
  Problem: Education: Goal: Ability to describe self-care measures that may prevent or decrease complications (Diabetes Survival Skills Education) will improve Outcome: Progressing   Problem: Coping: Goal: Ability to adjust to condition or change in health will improve Outcome: Progressing   Problem: Fluid Volume: Goal: Ability to maintain a balanced intake and output will improve Outcome: Progressing   Problem: Education: Goal: Knowledge of General Education information will improve Description: Including pain rating scale, medication(s)/side effects and non-pharmacologic comfort measures Outcome: Progressing   Problem: Clinical Measurements: Goal: Respiratory complications will improve Outcome: Progressing   Problem: Coping: Goal: Level of anxiety will decrease Outcome: Progressing

## 2024-05-29 NOTE — Assessment & Plan Note (Addendum)
 No acute concern Continue allopurinol  and colchicine 

## 2024-05-29 NOTE — Assessment & Plan Note (Addendum)
 Resume home BP meds including atenolol , amlodipine   Hold chlorthalidone 

## 2024-05-29 NOTE — Assessment & Plan Note (Signed)
 Uptrending temp today, currently 103 Possibly related to withdrawal, although this is a high fever for this More likely related to pancreatitis; CT without abscess or pseudocyst on 11/19 Treating with Tylenol , ice packs Unremarkable UA on presentation Will order CXR Will order blood cultures and lactic acid

## 2024-05-30 DIAGNOSIS — K852 Alcohol induced acute pancreatitis without necrosis or infection: Secondary | ICD-10-CM | POA: Diagnosis not present

## 2024-05-30 LAB — IGG 1, 2, 3, AND 4
IgG (Immunoglobin G), Serum: 1065 mg/dL (ref 603–1613)
IgG, Subclass 1: 570 mg/dL (ref 248–810)
IgG, Subclass 2: 243 mg/dL (ref 130–555)
IgG, Subclass 3: 63 mg/dL (ref 15–102)
IgG, Subclass 4: 12 mg/dL (ref 2–96)

## 2024-05-30 MED ORDER — ACETAMINOPHEN 325 MG PO TABS
650.0000 mg | ORAL_TABLET | ORAL | Status: DC | PRN
  Administered 2024-05-30: 650 mg via ORAL
  Filled 2024-05-30: qty 2

## 2024-05-30 MED ORDER — ACETAMINOPHEN 650 MG RE SUPP: 650.0000 mg | RECTAL | Status: DC | PRN

## 2024-05-30 NOTE — Assessment & Plan Note (Signed)
 A1c 8.5 He was actually given Metformin and glimepiride in 01/2024 but did not take metformin due to side effect profile DM coordinator consulted Needs to resume glimepiride at time of dc

## 2024-05-30 NOTE — Assessment & Plan Note (Signed)
 Uptrending temp on 11/21, Tmax 103 Possibly related to withdrawal, although this is a high fever for this More likely related to pancreatitis; CT without abscess or pseudocyst on 11/19 Treated with Tylenol , ice packs Unremarkable UA on presentation CXR without acute findings although it did show cardiomegaly Blood cultures pending

## 2024-05-30 NOTE — Assessment & Plan Note (Signed)
 On CIWA protocol with as needed benzos Having more significant withdrawal symptoms now and threatening to leave AMA on 11/21 Discussed with his wife about Alanon, stopping drinking altogether Left AMA in the early morning hours of 11/22

## 2024-05-30 NOTE — Assessment & Plan Note (Signed)
 Resume home BP meds including atenolol , amlodipine   Hold chlorthalidone 

## 2024-05-30 NOTE — Assessment & Plan Note (Signed)
 No longer taking Symbicort  Continue Singulair  Continue as needed albuterol 

## 2024-05-30 NOTE — Progress Notes (Signed)
 Patient removed his telemetry and reported that he was leaving AMA to go home. NP notified and she came to the room to talk with the patient. He signed the AMA form and agreed that he understood the risks of leaving AMA. Patient had already removed his IV. He left with his significant other at 0438.

## 2024-05-30 NOTE — Progress Notes (Signed)
    Against Medical Advice   Caleb Hunter , A/O x4, expresses desire to leave the Hospital immediately.  I was able to further discuss this decision with the patient at bedside  I have discussed with the patient his care and reason for hospitalization, including ongoing treatment for acute pancreatitis.  Although the patient confirms he understands why I am recommending he remain admitted, the patient is adamant about leaving medical care now.  I have warned the patient that leaving AMA can result in medical complications like worsening abdominal pain, infection, irreversible disability, and/or death.  The patient understands and accepts the risks involved and assumes full responsibilty of this decision.  He is agreeable to signing AMA form.  This patient has also been advised that if they feel the need for further medical assistance to return to any available ER or dial [phone].   Nora Rooke, DNP, ACNPC- AG [facility]

## 2024-05-30 NOTE — Discharge Summary (Signed)
 [facility] Discharge Summary   Patient: Caleb Hunter FMW:[id number]   PCP: Catalina Bare, MD DOB: [birth year]   Date of admission: 05/28/2024   Date of discharge: 05/30/2024    Discharge Diagnoses:   Principal Problem:   Acute alcoholic pancreatitis Active Problems:   Alcohol use disorder   Class 1 obesity due to excess calories with body mass index (BMI) of 32.0 to 32.9 in adult   Essential hypertension   Gout   New onset type 2 diabetes mellitus (HCC)   Moderate persistent asthma   Fever   Dyslipidemia   Discharge Condition: patient left [facility]    Hospital Course:  58yo with h/o ETOH use d/o (significantly worse in the last few months following the death of his mother), HTN, HLD, gout, anxiety/depression, and moderate persistent asthma who presented on 11/20 with abdominal pain.  He drinks about 3/4 of a fifth daily.  He presented to the ER for this issue on 11/20 just after midnight and left AMA and then returned a few hours later.  Elevated lipase, CT with acute pancreatitis.  Admitted for supportive care, developed ETOH withdrawal.  Patient eventually left AMA.  He is pre-contemplative regarding his ETOH use.    Summary of his active problems in the hospital is as following. Principal Problem:   Acute alcoholic pancreatitis Active Problems:   Alcohol use disorder   Class 1 obesity due to excess calories with body mass index (BMI) of 32.0 to 32.9 in adult   Essential hypertension   Gout   New onset type 2 diabetes mellitus (HCC)   Moderate persistent asthma   Fever   Dyslipidemia    patient left AMA    Assessment & Plan Acute alcoholic pancreatitis Symptomatic management Aggressive IV hydration NPO -> full liquids, appears to be tolerating Pain control This issue was clearly improving Alcohol use disorder On CIWA protocol with as needed benzos Having more significant withdrawal symptoms now and threatening to leave AMA on 11/21 Discussed with his  wife about Alanon, stopping drinking altogether Left AMA in the early morning hours of 11/22 Fever Uptrending temp on 11/21, Tmax 103 Possibly related to withdrawal, although this is a high fever for this More likely related to pancreatitis; CT without abscess or pseudocyst on 11/19 Treated with Tylenol , ice packs Unremarkable UA on presentation CXR without acute findings although it did show cardiomegaly Blood cultures pending Essential hypertension Resume home BP meds including atenolol , amlodipine   Hold chlorthalidone  Gout No acute concern Continue allopurinol  and colchicine   New onset type 2 diabetes mellitus (HCC) A1c 8.5 He was actually given Metformin and glimepiride in 01/2024 but did not take metformin due to side effect profile DM coordinator consulted Needs to resume glimepiride at time of dc Moderate persistent asthma No longer taking Symbicort  Continue Singulair  Continue as needed albuterol   Dyslipidemia Continue atorvastatin  Class 1 obesity due to excess calories with body mass index (BMI) of 32.0 to 32.9 in adult Body mass index is 32.87 kg/m.SABRA  Weight loss should be encouraged Outpatient PCP/bariatric medicine f/u encouraged Significantly low or high BMI is associated with higher medical risk including morbidity and mortality        The results of significant diagnostics from this hospitalization (including imaging, microbiology, ancillary and laboratory) are listed below for reference.    Significant Diagnostic Studies: DG CHEST PORT 1 VIEW Result Date: 05/29/2024 EXAM: 1 VIEW(S) XRAY OF THE CHEST 05/29/2024 04:55:00 PM COMPARISON: Comparison with previous study dated 05/07/2023. CLINICAL HISTORY: Fever. Acute alcoholic pancreatitis.  FINDINGS: LUNGS AND PLEURA: No vascular congestion, edema, or consolidation. No pleural effusion. No pneumothorax. HEART AND MEDIASTINUM: Cardiac enlargement. Mediastinal contours appear intact. BONES AND SOFT TISSUES: No acute  osseous abnormality. IMPRESSION: 1. No acute cardiopulmonary abnormality. 2. Cardiomegaly without edema or effusion. Electronically signed by: Elsie Gravely MD 05/29/2024 05:20 PM EST RP Workstation: [id number]   CT ABDOMEN PELVIS W CONTRAST Result Date: 05/27/2024 CLINICAL DATA:  Abdominal pain. EXAM: CT ABDOMEN AND PELVIS WITH CONTRAST TECHNIQUE: Multidetector CT imaging of the abdomen and pelvis was performed using the standard protocol following bolus administration of intravenous contrast. RADIATION DOSE REDUCTION: This exam was performed according to the departmental dose-optimization program which includes automated exposure control, adjustment of the mA and/or kV according to patient size and/or use of iterative reconstruction technique. CONTRAST:  75mL OMNIPAQUE  IOHEXOL  350 MG/ML SOLN COMPARISON:  CT abdomen pelvis dated 04/28/2024. FINDINGS: Lower chest: The visualized lung bases are clear. No intra-abdominal free air or free fluid. Hepatobiliary: Fatty liver. No biliary dilatation. The gallbladder is unremarkable Pancreas: Inflammatory changes predominantly involving the uncinate process of the pancreas consistent with acute pancreatitis. There is also inflammatory changes of the distal body and tail of the pancreas. No abscess or pseudocyst. Spleen: Normal in size without focal abnormality. Adrenals/Urinary Tract: The adrenal glands unremarkable there is no hydronephrosis on either side. The visualized ureters and urinary bladder appear unremarkable Stomach/Bowel: There is no bowel obstruction or active inflammation. The appendix is normal. Vascular/Lymphatic: Mild aortoiliac atherosclerotic disease. The IVC is unremarkable no portal venous gas. There is no adenopathy. Reproductive: The prostate and seminal vesicles are grossly remarkable. Other: None Musculoskeletal: L3-L4 disc spacer and posterior fusion. No acute osseous pathology. IMPRESSION: 1. Acute pancreatitis. No abscess or pseudocyst. 2.  Fatty liver. 3. No bowel obstruction. Normal appendix. 4.  Aortic Atherosclerosis (ICD10-I70.0). Electronically Signed   By: Vanetta Chou M.D.   On: 05/27/2024 14:23    Microbiology: Recent Results (from the past 240 hours)  Culture, blood (Routine X 2) w Reflex to ID Panel     Status: None (Preliminary result)   Collection Time: 05/29/24  6:53 PM   Specimen: BLOOD RIGHT HAND  Result Value Ref Range Status   Specimen Description   Final    BLOOD RIGHT HAND Performed at [facility], [address], [place], [place] [address]    Special Requests   Final    BOTTLES DRAWN AEROBIC ONLY Blood Culture adequate volume Performed at [facility], [address], [place], [place] [address]    Culture PENDING  Incomplete   Report Status PENDING  Incomplete  Culture, blood (Routine X 2) w Reflex to ID Panel     Status: None (Preliminary result)   Collection Time: 05/29/24  6:53 PM   Specimen: BLOOD RIGHT HAND  Result Value Ref Range Status   Specimen Description   Final    BLOOD RIGHT HAND Performed at [facility], [address], [place], [place] [address]    Special Requests   Final    BOTTLES DRAWN AEROBIC ONLY Blood Culture adequate volume Performed at [facility], [address] [place], [place] [address]    Culture PENDING  Incomplete   Report Status PENDING  Incomplete     Labs: CBC: Recent Labs  Lab 05/27/24 1250 05/27/24 1309 05/28/24 0439 05/29/24 0543  WBC  --  11.4* 10.9* 17.2*  NEUTROABS  --  7.3 8.2*  --   HGB 15.6 14.5 13.8  13.2  HCT 46.0 42.7 41.9 39.7  MCV  --  83.6 84.6 86.1  PLT  --  173 168 142*   Basic Metabolic Panel: Recent Labs  Lab 05/27/24 1250 05/27/24 1309 05/28/24 0439 05/29/24 0543  NA 132* 132* 132* 131*  K 3.8 3.9 4.1 3.7  CL 95* 91* 93* 92*  CO2  --  23 27 28   GLUCOSE 309* 296* 248* 213*  BUN 23* 19 24* 19  CREATININE 1.20 1.23 1.16 1.11  CALCIUM   --  9.2 9.6 9.1   Liver  Function Tests: Recent Labs  Lab 05/27/24 1309 05/28/24 0439  AST 41 50*  ALT 54* 51*  ALKPHOS 108 117  BILITOT 1.2 1.2  PROT 7.8 7.8  ALBUMIN 3.6 3.9   Recent Labs  Lab 05/27/24 1309 05/28/24 0439 05/29/24 0543  LIPASE 630* 1,015* 255*   No results for input(s): AMMONIA in the last 168 hours. Cardiac Enzymes: No results for input(s): CKTOTAL, CKMB, CKMBINDEX, TROPONINI in the last 168 hours. BNP (last 3 results) No results for input(s): BNP in the last 8760 hours. CBG: Recent Labs  Lab 05/28/24 2120 05/29/24 0752 05/29/24 1203 05/29/24 1742 05/29/24 2141  GLUCAP 217* 207* 184* 190* 186*   Time spent: <30 minutes  Signed:  Delon Herald  [facility] 05/30/2024, 8:23 AM

## 2024-05-30 NOTE — Assessment & Plan Note (Signed)
 Body mass index is 32.87 kg/m.Caleb Hunter  Weight loss should be encouraged Outpatient PCP/bariatric medicine f/u encouraged Significantly low or high BMI is associated with higher medical risk including morbidity and mortality

## 2024-05-30 NOTE — Assessment & Plan Note (Signed)
 Symptomatic management Aggressive IV hydration NPO -> full liquids, appears to be tolerating Pain control This issue was clearly improving

## 2024-05-30 NOTE — Assessment & Plan Note (Signed)
 No acute concern Continue allopurinol  and colchicine 

## 2024-05-30 NOTE — Assessment & Plan Note (Signed)
 Continue atorvastatin 

## 2024-06-03 LAB — CULTURE, BLOOD (ROUTINE X 2)
Culture: NO GROWTH
Culture: NO GROWTH
Special Requests: ADEQUATE
Special Requests: ADEQUATE

## 2024-06-09 ENCOUNTER — Ambulatory Visit: Admitting: Podiatry

## 2024-06-17 ENCOUNTER — Ambulatory Visit: Admitting: Rheumatology

## 2024-06-30 ENCOUNTER — Ambulatory Visit

## 2024-06-30 ENCOUNTER — Ambulatory Visit: Admitting: Podiatry

## 2024-08-04 ENCOUNTER — Ambulatory Visit: Admitting: Podiatry

## 2024-08-25 ENCOUNTER — Ambulatory Visit: Admitting: Podiatry
